# Patient Record
Sex: Male | Born: 1960 | Race: Black or African American | Hispanic: No | Marital: Single | Smoking: Current every day smoker
Health system: Southern US, Community
[De-identification: ages and names within clinical notes are randomized; demographics above are authoritative.]

## PROBLEM LIST (undated history)

## (undated) DIAGNOSIS — I639 Cerebral infarction, unspecified: Secondary | ICD-10-CM

## (undated) DIAGNOSIS — M25569 Pain in unspecified knee: Secondary | ICD-10-CM

## (undated) DIAGNOSIS — I214 Non-ST elevation (NSTEMI) myocardial infarction: Secondary | ICD-10-CM

## (undated) DIAGNOSIS — E785 Hyperlipidemia, unspecified: Secondary | ICD-10-CM

## (undated) DIAGNOSIS — I10 Essential (primary) hypertension: Secondary | ICD-10-CM

## (undated) DIAGNOSIS — M549 Dorsalgia, unspecified: Secondary | ICD-10-CM

## (undated) DIAGNOSIS — F1910 Other psychoactive substance abuse, uncomplicated: Secondary | ICD-10-CM

## (undated) DIAGNOSIS — M1990 Unspecified osteoarthritis, unspecified site: Secondary | ICD-10-CM

## (undated) DIAGNOSIS — I2510 Atherosclerotic heart disease of native coronary artery without angina pectoris: Secondary | ICD-10-CM

## (undated) DIAGNOSIS — G8929 Other chronic pain: Secondary | ICD-10-CM

## (undated) DIAGNOSIS — Z720 Tobacco use: Secondary | ICD-10-CM

## (undated) HISTORY — DX: Cerebral infarction, unspecified: I63.9

## (undated) HISTORY — DX: Essential (primary) hypertension: I10

## (undated) HISTORY — DX: Atherosclerotic heart disease of native coronary artery without angina pectoris: I25.10

## (undated) HISTORY — DX: Tobacco use: Z72.0

---

## 1998-03-22 ENCOUNTER — Emergency Department (HOSPITAL_COMMUNITY): Admission: EM | Admit: 1998-03-22 | Discharge: 1998-03-22 | Payer: Self-pay | Admitting: Emergency Medicine

## 2000-04-05 ENCOUNTER — Emergency Department (HOSPITAL_COMMUNITY): Admission: EM | Admit: 2000-04-05 | Discharge: 2000-04-05 | Payer: Self-pay | Admitting: Emergency Medicine

## 2000-04-05 ENCOUNTER — Encounter: Payer: Self-pay | Admitting: Emergency Medicine

## 2005-07-31 ENCOUNTER — Emergency Department (HOSPITAL_COMMUNITY): Admission: EM | Admit: 2005-07-31 | Discharge: 2005-07-31 | Payer: Self-pay | Admitting: Emergency Medicine

## 2005-10-04 ENCOUNTER — Emergency Department (HOSPITAL_COMMUNITY): Admission: EM | Admit: 2005-10-04 | Discharge: 2005-10-04 | Payer: Self-pay | Admitting: Emergency Medicine

## 2006-03-12 ENCOUNTER — Emergency Department (HOSPITAL_COMMUNITY): Admission: EM | Admit: 2006-03-12 | Discharge: 2006-03-12 | Payer: Self-pay | Admitting: Emergency Medicine

## 2006-11-23 ENCOUNTER — Emergency Department (HOSPITAL_COMMUNITY): Admission: EM | Admit: 2006-11-23 | Discharge: 2006-11-23 | Payer: Self-pay | Admitting: Emergency Medicine
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
## 2010-12-07 LAB — DIFFERENTIAL
Basophils Relative: 0
Eosinophils Absolute: 0.1
Monocytes Relative: 4
Neutrophils Relative %: 82 — ABNORMAL HIGH

## 2010-12-07 LAB — CBC
MCHC: 34.5
MCV: 91.6
Platelets: 258
RBC: 4.79

## 2010-12-07 LAB — I-STAT 8, (EC8 V) (CONVERTED LAB)
Acid-base deficit: 1
BUN: 9
Bicarbonate: 22.6
HCT: 50
Hemoglobin: 17
Operator id: 192351
Sodium: 138
TCO2: 24

## 2010-12-07 LAB — RAPID URINE DRUG SCREEN, HOSP PERFORMED
Opiates: NOT DETECTED
Tetrahydrocannabinol: NOT DETECTED

## 2010-12-07 LAB — POCT I-STAT CREATININE: Creatinine, Ser: 1.3

## 2015-03-21 ENCOUNTER — Encounter (HOSPITAL_COMMUNITY): Payer: Self-pay

## 2015-03-21 ENCOUNTER — Emergency Department (HOSPITAL_COMMUNITY)
Admission: EM | Admit: 2015-03-21 | Discharge: 2015-03-21 | Disposition: A | Discharge status: Home / Self Care | Payer: Self-pay | Attending: Emergency Medicine | Admitting: Emergency Medicine

## 2015-03-21 DIAGNOSIS — G8929 Other chronic pain: Secondary | ICD-10-CM

## 2015-03-21 DIAGNOSIS — F17210 Nicotine dependence, cigarettes, uncomplicated: Secondary | ICD-10-CM | POA: Insufficient documentation

## 2015-03-21 DIAGNOSIS — M25561 Pain in right knee: Secondary | ICD-10-CM | POA: Insufficient documentation

## 2015-03-21 DIAGNOSIS — H9202 Otalgia, left ear: Secondary | ICD-10-CM | POA: Insufficient documentation

## 2015-03-21 HISTORY — DX: Unspecified osteoarthritis, unspecified site: M19.90

## 2015-03-21 MED ORDER — AMOXICILLIN 500 MG PO CAPS: 500.0000 mg | ORAL_CAPSULE | Freq: Three times a day (TID) | ORAL | Status: DC

## 2015-03-21 NOTE — Discharge Instructions (Signed)
Earache An earache, also called otalgia, can be caused by many things. Pain from an earache can be sharp, dull, or burning. The pain may be temporary or constant. Earaches can be caused by problems with the ear, such as infection in either the middle ear or the ear canal, injury, impacted ear wax, middle ear pressure, or a foreign body in the ear. Ear pain can also result from problems in other areas. This is called referred pain. For example, pain can come from a sore throat, a tooth infection, or problems with the jaw or the joint between the jaw and the skull (temporomandibular joint, or TMJ). The cause of an earache is not always easy to identify. Watchful waiting may be appropriate for some earaches until a clear cause of the pain can be found. HOME CARE INSTRUCTIONS Watch your condition for any changes. The following actions may help to lessen any discomfort that you are feeling:  Take medicines only as directed by your health care provider. This includes ear drops.  Apply ice to your outer ear to help reduce pain.  Put ice in a plastic bag.  Place a towel between your skin and the bag.  Leave the ice on for 20 minutes, 2-3 times per day.  Do not put anything in your ear other than medicine that is prescribed by your health care provider.  Try resting in an upright position instead of lying down. This may help to reduce pressure in the middle ear and relieve pain.  Chew gum if it helps to relieve your ear pain.  Control any allergies that you have.  Keep all follow-up visits as directed by your health care provider. This is important. SEEK MEDICAL CARE IF:  Your pain does not improve within 2 days.  You have a fever.  You have new or worsening symptoms. SEEK IMMEDIATE MEDICAL CARE IF:  You have a severe headache.  You have a stiff neck.  You have difficulty swallowing.  You have redness or swelling behind your ear.  You have drainage from your ear.  You have hearing  loss.  You feel dizzy.   This information is not intended to replace advice given to you by your health care provider. Make sure you discuss any questions you have with your health care provider.   Document Released: 09/30/2003 Document Revised: 03/05/2014 Document Reviewed: 09/13/2013 Elsevier Interactive Patient Education 2016 [employer]. Joint Pain Joint pain, which is also called arthralgia, can be caused by many things. Joint pain often goes away when you follow your health care provider's instructions for relieving pain at home. However, joint pain can also be caused by conditions that require further treatment. Common causes of joint pain include:  Bruising in the area of the joint.  Overuse of the joint.  Wear and tear on the joints that occur with aging (osteoarthritis).  Various other forms of arthritis.  A buildup of a crystal form of uric acid in the joint (gout).  Infections of the joint (septic arthritis) or of the bone (osteomyelitis). Your health care provider may recommend medicine to help with the pain. If your joint pain continues, additional tests may be needed to diagnose your condition. HOME CARE INSTRUCTIONS Watch your condition for any changes. Follow these instructions as directed to lessen the pain that you are feeling.  Take medicines only as directed by your health care provider.  Rest the affected area for as long as your health care provider says that you should. If directed  to do so, raise the painful joint above the level of your heart while you are sitting or lying down.  Do not do things that cause or worsen pain.  If directed, apply ice to the painful area:  Put ice in a plastic bag.  Place a towel between your skin and the bag.  Leave the ice on for 20 minutes, 2-3 times per day.  Wear an elastic bandage, splint, or sling as directed by your health care provider. Loosen the elastic bandage or splint if your fingers or toes become numb and  tingle, or if they turn cold and blue.  Begin exercising or stretching the affected area as directed by your health care provider. Ask your health care provider what types of exercise are safe for you.  Keep all follow-up visits as directed by your health care provider. This is important. SEEK MEDICAL CARE IF:  Your pain increases, and medicine does not help.  Your joint pain does not improve within 3 days.  You have increased bruising or swelling.  You have a fever.  You lose 10 lb (4.5 kg) or more without trying. SEEK IMMEDIATE MEDICAL CARE IF:  You are not able to move the joint.  Your fingers or toes become numb or they turn cold and blue.   This information is not intended to replace advice given to you by your health care provider. Make sure you discuss any questions you have with your health care provider.   Document Released: 02/12/2005 Document Revised: 03/05/2014 Document Reviewed: 11/24/2013 Elsevier Interactive Patient Education [employer]

## 2015-03-21 NOTE — ED Provider Notes (Signed)
CSN: [id number]     Arrival date & time 03/21/15  4401 History   First MD Initiated Contact with Patient 03/21/15 [phone]     Chief Complaint  Patient presents with  . knee pain and earache      (Consider location/radiation/quality/duration/timing/severity/associated sxs/prior Treatment) HPI Comments: Patient presents to the emergency department with chief complaint of right knee pain and left ear ache. He states that he has been having the right knee pain for years. States that he has arthritis in his knee. States that it occasionally flares up. He states that he has been taking pain medication with no relief. States that he will are his cousins knee brace. He denies any associated fevers or chills. Denies any swelling or new injury. There are no radiating symptoms.  Patient also complains of left earache. He states that he has had the earache intermittently as long as 8 years. He denies any fevers or chills. There are no aggravating or alleviating factors. He has not tried taking anything for his symptoms.  The history is provided by the patient. No language interpreter was used.    Past Medical History  Diagnosis Date  . Arthritis    History reviewed. No pertinent past surgical history. No family history on file. Social History  Substance Use Topics  . Smoking status: Current Every Day Smoker    Types: Cigarettes  . Smokeless tobacco: None  . Alcohol Use: None    Review of Systems  Constitutional: Negative for fever and chills.  HENT: Positive for ear pain.   Respiratory: Negative for shortness of breath.   Cardiovascular: Negative for chest pain.  Gastrointestinal: Negative for nausea, vomiting, diarrhea and constipation.  Genitourinary: Negative for dysuria.  Musculoskeletal: Positive for arthralgias.      Allergies  Review of patient's allergies indicates no known allergies.  Home Medications   Prior to Admission medications   Medication Sig Start Date End Date  Taking? Authorizing Provider  amoxicillin (AMOXIL) 500 MG capsule Take 1 capsule (500 mg total) by mouth 3 (three) times daily. 03/21/15   Roxy Horseman, PA-C   BP 162/93 mmHg  Pulse 80  Temp(Src) 98 F (36.7 C) (Oral)  Resp 18  Ht  (1.626 m)  Wt 102.059 kg  BMI 38.60 kg/m2  SpO2 98% Physical Exam  Constitutional: He is oriented to person, place, and time. He appears well-developed and well-nourished.  HENT:  Head: Normocephalic and atraumatic.  Left tympanic membrane is mildly erythematous Normal right tympanic membrane  Eyes: Conjunctivae and EOM are normal. Pupils are equal, round, and reactive to light. Right eye exhibits no discharge. Left eye exhibits no discharge. No scleral icterus.  Neck: Normal range of motion. Neck supple. No JVD present.  Cardiovascular: Normal rate, regular rhythm and normal heart sounds.  Exam reveals no gallop and no friction rub.   No murmur heard. Pulmonary/Chest: Effort normal and breath sounds normal. No respiratory distress. He has no wheezes. He has no rales. He exhibits no tenderness.  Abdominal: Soft. He exhibits no distension and no mass. There is no tenderness. There is no rebound and no guarding.  Musculoskeletal: Normal range of motion. He exhibits no edema or tenderness.  Right knee range of motion strength 5/5 No bony abnormality or deformity No erythema or swelling No evidence of infection No calf tenderness  Neurological: He is alert and oriented to person, place, and time.  Skin: Skin is warm and dry.  No sign of cellulitis  Psychiatric: He has a  normal mood and affect. His behavior is normal. Judgment and thought content normal.  Nursing note and vitals reviewed.   ED Course  Procedures (including critical care time)   MDM   Final diagnoses:  Ear ache, left  Chronic knee pain, right    Patient with left earache, possible otitis, tympanic membrane is mildly erythematous. Right knee nontender to palpation, no bony  abnormality or deformity. Normal range of motion strength. No indication for imaging. Will give knee sleeve. Recommend close follow-up with orthopedics and ENT, as both these problems are acute on chronic.    Roxy Horseman, PA-C 03/21/15 0831  Blane Ohara, MD 03/21/15 [phone]

## 2015-03-21 NOTE — ED Notes (Signed)
Declined W/C at D/C and was escorted to lobby by RN. 

## 2015-03-21 NOTE — ED Notes (Addendum)
Patient here reporting that he needs something for pain for his right knee, states arthritis acting up, also wants left ear checked for chronic pain to same, ear pain x 8 years

## 2015-04-11 ENCOUNTER — Emergency Department (HOSPITAL_COMMUNITY)
Admission: EM | Admit: 2015-04-11 | Discharge: 2015-04-11 | Disposition: A | Discharge status: Home / Self Care | Payer: Self-pay | Attending: Emergency Medicine | Admitting: Emergency Medicine

## 2015-04-11 ENCOUNTER — Encounter (HOSPITAL_COMMUNITY): Payer: Self-pay | Admitting: Emergency Medicine

## 2015-04-11 DIAGNOSIS — G8929 Other chronic pain: Secondary | ICD-10-CM | POA: Insufficient documentation

## 2015-04-11 DIAGNOSIS — M549 Dorsalgia, unspecified: Secondary | ICD-10-CM | POA: Insufficient documentation

## 2015-04-11 DIAGNOSIS — F17210 Nicotine dependence, cigarettes, uncomplicated: Secondary | ICD-10-CM | POA: Insufficient documentation

## 2015-04-11 DIAGNOSIS — M25511 Pain in right shoulder: Secondary | ICD-10-CM | POA: Insufficient documentation

## 2015-04-11 DIAGNOSIS — M25561 Pain in right knee: Secondary | ICD-10-CM | POA: Insufficient documentation

## 2015-04-11 DIAGNOSIS — Z792 Long term (current) use of antibiotics: Secondary | ICD-10-CM | POA: Insufficient documentation

## 2015-04-11 DIAGNOSIS — M25512 Pain in left shoulder: Secondary | ICD-10-CM | POA: Insufficient documentation

## 2015-04-11 HISTORY — DX: Pain in unspecified knee: M25.569

## 2015-04-11 HISTORY — DX: Dorsalgia, unspecified: M54.9

## 2015-04-11 HISTORY — DX: Other chronic pain: G89.29

## 2015-04-11 MED ORDER — NAPROXEN 500 MG PO TABS: 500.0000 mg | ORAL_TABLET | Freq: Two times a day (BID) | ORAL | Status: DC

## 2015-04-11 NOTE — ED Notes (Signed)
Declined W/C at D/C and was escorted to lobby by RN. 

## 2015-04-11 NOTE — Discharge Instructions (Signed)
Take your medications as prescribed to help with your discomfort. Follow-up with your doctor for reevaluation. He may also follow-up with orthopedics if her symptoms do not improve.

## 2015-04-11 NOTE — ED Provider Notes (Signed)
CSN: [id number]     Arrival date & time 04/11/15  0865 History   By signing my name below, I, Kevin Barton, attest that this documentation has been prepared under the direction and in the presence of Kevin Hatchet, PA-C.  Electronically Signed: Iona Barton, ED Scribe 04/11/2015 at 12:15 PM.    Chief Complaint  Patient presents with  . Back Pain  . Knee Pain    The history is provided by the patient. No language interpreter was used.   HPI Comments: Kevin Barton is a 55 y.o. male who presents to the Emergency Department complaining of gradual onset, constant, chronic back pain, worsening in the past few weeks. Pt also notes associated right knee pain, and pain in both shoulders. Back pain and knee pain is worsened with movement. Pt states that he believes the pain is caused by arthritis and that he is unable to work because of its severity. Pt denies fevers, chills, nausea, numbness, or any other associated symptoms.   Past Medical History  Diagnosis Date  . Arthritis   . Chronic knee pain   . Chronic back pain    History reviewed. No pertinent past surgical history. No family history on file. Social History  Substance Use Topics  . Smoking status: Current Every Day Smoker    Types: Cigarettes  . Smokeless tobacco: None  . Alcohol Use: None    Review of Systems  Cardiovascular: Negative for chest pain.  Musculoskeletal: Positive for arthralgias.  Neurological: Negative for dizziness, weakness and numbness.  All other systems reviewed and are negative.     Allergies  Review of patient's allergies indicates no known allergies.  Home Medications   Prior to Admission medications   Medication Sig Start Date End Date Taking? Authorizing Provider  amoxicillin (AMOXIL) 500 MG capsule Take 1 capsule (500 mg total) by mouth 3 (three) times daily. 03/21/15   Kevin Horseman, PA-C  naproxen (NAPROSYN) 500 MG tablet Take 1 tablet (500 mg total) by mouth 2 (two) times  daily. 04/11/15   Kevin Peek, PA-C   BP 142/89 mmHg  Pulse 83  Temp(Src) 98.4 F (36.9 C) (Oral)  Resp 18  Ht  (1.626 m)  Wt 225 lb (102.059 kg)  BMI 38.60 kg/m2  SpO2 97% Physical Exam  Constitutional: He appears well-developed and well-nourished. No distress.  HENT:  Head: Normocephalic and atraumatic.  Eyes: Conjunctivae and EOM are normal.  Neck: Neck supple. No tracheal deviation present.  Cardiovascular: Normal rate, regular rhythm, normal heart sounds and intact distal pulses.   Pulmonary/Chest: Effort normal and breath sounds normal. No respiratory distress. He has no wheezes. He has no rales.  Abdominal: Soft. Bowel sounds are normal. He exhibits no distension. There is no tenderness. There is no rebound and no guarding.  Musculoskeletal: Normal range of motion.       Right knee: He exhibits normal range of motion, no swelling, no effusion, no ecchymosis, no deformity, no laceration, no erythema, normal alignment, no LCL laxity and normal patellar mobility. No tenderness found.       Lumbar back: He exhibits normal range of motion, no tenderness, no bony tenderness, no swelling, no edema, no deformity, no laceration, no pain, no spasm and normal pulse.  Neurological: He is alert.  Skin: Skin is warm and dry.  Psychiatric: He has a normal mood and affect. His behavior is normal.    ED Course  Procedures (including critical care time) DIAGNOSTIC STUDIES: Oxygen Saturation is 97% on RA,  normal by my interpretation.    COORDINATION OF CARE:   12:05 PM-Discussed treatment plan which includes naprosyn with pt at bedside and pt agreed to plan.   Labs Review Labs Reviewed - No data to display  Imaging Review No results found.   EKG Interpretation None     Meds given in ED:  Medications - No data to display  Discharge Medication List as of 04/11/2015 12:15 PM    START taking these medications   Details  naproxen (NAPROSYN) 500 MG tablet Take 1 tablet  (500 mg total) by mouth 2 (two) times daily., Starting 04/11/2015, Until Discontinued, Print       Filed Vitals:   04/11/15 1018 04/11/15 1020 04/11/15 1216  BP:  142/89 130/79  Pulse: 83  81  Temp: 98.4 F (36.9 C)    TempSrc: Oral    Resp: 18  16  Height:  (1.626 m)    Weight: 102.059 kg    SpO2: 97%  98%    MDM  Kevin Barton is a 55 y.o. male reports a history of arthritis and chronic back and knee pain comes in for evaluation of acute exacerbation of his chronic pain. He has not tried anything to improve the symptoms. No concerning findings on physical exam. No pathologic back pain red flags. Plan to initiate NSAID therapy, referral given to primary care so patient may follow-up. Also given referral to orthopedics. He verbalizes understanding and agrees with this plan. No evidence of other acute or emergent pathology. Final diagnoses:  Knee pain, chronic, right  Bilateral back pain, unspecified location   I personally performed the services described in this documentation, which was scribed in my presence. The recorded information has been reviewed and is accurate.   Kevin Peek, PA-C 04/11/15 1557  Kevin Barrette, MD 04/13/15 [phone]

## 2015-04-11 NOTE — ED Notes (Signed)
Pt c/o pain to knee and low back ongoing for six years.

## 2015-07-21 ENCOUNTER — Encounter (HOSPITAL_COMMUNITY): Payer: Self-pay | Admitting: Emergency Medicine

## 2015-07-21 ENCOUNTER — Emergency Department (HOSPITAL_COMMUNITY)
Admission: EM | Admit: 2015-07-21 | Discharge: 2015-07-21 | Disposition: A | Discharge status: Home / Self Care | Payer: Self-pay | Attending: Emergency Medicine | Admitting: Emergency Medicine

## 2015-07-21 DIAGNOSIS — Z792 Long term (current) use of antibiotics: Secondary | ICD-10-CM | POA: Insufficient documentation

## 2015-07-21 DIAGNOSIS — G8929 Other chronic pain: Secondary | ICD-10-CM | POA: Insufficient documentation

## 2015-07-21 DIAGNOSIS — Z791 Long term (current) use of non-steroidal anti-inflammatories (NSAID): Secondary | ICD-10-CM | POA: Insufficient documentation

## 2015-07-21 DIAGNOSIS — M1990 Unspecified osteoarthritis, unspecified site: Secondary | ICD-10-CM | POA: Insufficient documentation

## 2015-07-21 DIAGNOSIS — H9202 Otalgia, left ear: Secondary | ICD-10-CM | POA: Insufficient documentation

## 2015-07-21 DIAGNOSIS — H73892 Other specified disorders of tympanic membrane, left ear: Secondary | ICD-10-CM | POA: Insufficient documentation

## 2015-07-21 DIAGNOSIS — F17210 Nicotine dependence, cigarettes, uncomplicated: Secondary | ICD-10-CM | POA: Insufficient documentation

## 2015-07-21 MED ORDER — AMOXICILLIN-POT CLAVULANATE 875-125 MG PO TABS: 1.0000 | ORAL_TABLET | Freq: Two times a day (BID) | ORAL | Status: DC

## 2015-07-21 MED ORDER — NEOMYCIN-POLYMYXIN-HC 3.5-10000-1 OT SUSP: 4.0000 [drp] | Freq: Three times a day (TID) | OTIC | Status: DC

## 2015-07-21 NOTE — ED Provider Notes (Signed)
CSN: [id number]     Arrival date & time 07/21/15  1416 History   By signing my name below, I, Kevin Barton, attest that this documentation has been prepared under the direction and in the presence of [name], PA-C. Electronically Signed: Tanda Barton, ED Scribe. 07/21/2015. 3:10 PM.   No chief complaint on file.  The history is provided by the patient. No language interpreter was used.    HPI Comments: Kevin Barton is a 55 y.o. male who presents to the Emergency Department complaining of a intermittent, left ear pain radiating along the left side of face onset 2 days ago.The ear pain worsens with air contact. Patient has been having recurrent ear infections for the past 3 months and was previously treated with Amoxicillin and Naproxen with relief.  Patient denies drainage, difficulty hearing, nasal congestion, and fevers.    Past Medical History  Diagnosis Date  . Arthritis   . Chronic knee pain   . Chronic back pain    No past surgical history on file. No family history on file. Social History  Substance Use Topics  . Smoking status: Current Every Day Smoker    Types: Cigarettes  . Smokeless tobacco: Not on file  . Alcohol Use: Not on file    Review of Systems  Constitutional: Negative for fever.  HENT: Positive for ear pain. Negative for congestion, ear discharge and hearing loss.   All other systems reviewed and are negative.  Allergies  Review of patient's allergies indicates no known allergies.  Home Medications   Prior to Admission medications   Medication Sig Start Date End Date Taking? Authorizing Provider  amoxicillin (AMOXIL) 500 MG capsule Take 1 capsule (500 mg total) by mouth 3 (three) times daily. 03/21/15   Roxy Horseman, PA-C  naproxen (NAPROSYN) 500 MG tablet Take 1 tablet (500 mg total) by mouth 2 (two) times daily. 04/11/15   Joycie Peek, PA-C   There were no vitals taken for this visit. Physical Exam  Constitutional: He is oriented to  person, place, and time. He appears well-developed and well-nourished. No distress.  HENT:  Head: Normocephalic and atraumatic.  Right Ear: External ear normal.  Left Ear: External ear normal.  Nose: Nose normal.  Mouth/Throat: Oropharynx is clear and moist. No oropharyngeal exudate.  Mild erythema of superior half the left tympanic membrane inferior half mildly opaque, nonbulging Left external auditory canal normal appearance without edema, erythema or exudate Right TM and external auditory canal normal  Eyes: Conjunctivae and EOM are normal. Pupils are equal, round, and reactive to light. Right eye exhibits no discharge. Left eye exhibits no discharge. No scleral icterus.  Neck: Normal range of motion. Neck supple. No JVD present. No tracheal deviation present.  Cardiovascular: Normal rate and regular rhythm.   Pulmonary/Chest: Effort normal and breath sounds normal. No stridor. No respiratory distress.  Musculoskeletal: Normal range of motion. He exhibits no edema.  Lymphadenopathy:    He has no cervical adenopathy.  Neurological: He is alert and oriented to person, place, and time. He exhibits normal muscle tone. Coordination normal.  Skin: Skin is warm and dry. No rash noted. He is not diaphoretic. No erythema. No pallor.  Psychiatric: He has a normal mood and affect. His behavior is normal. Judgment and thought content normal.  Nursing note and vitals reviewed.   ED Course  Procedures (including critical care time) DIAGNOSTIC STUDIES: Oxygen Saturation is 98% on RA, normal by my interpretation.    COORDINATION OF CARE: 3:10 PM-Discussed  treatment plan which includes Rx of oral antibiotics, ear drops, and ENT referral with pt at bedside and pt agreed to plan.   MDM   Patient with left otalgia, some erythema and opacity at left TM, he's had multiple ear complaints and infections in the past 3 months which she states improved with antibiotic treatment.  We'll give drops and oral  antibiotics, ENT referral given with 3-4 visits in the past 3 months with ear complaints. Patient otherwise is well-appearing, vital signs stable, safe to discharge home.  Final diagnoses:  Otalgia, left    I personally performed the services described in this documentation, which was scribed in my presence. The recorded information has been reviewed and is accurate.        Danelle BerryLeisa Nalea Salce, PA-C 07/24/15 0009  Benjiman CoreNathan Pickering, MD 07/28/15 2257

## 2015-07-21 NOTE — Discharge Instructions (Signed)
Earache An earache, also called otalgia, can be caused by many things. Pain from an earache can be sharp, dull, or burning. The pain may be temporary or constant. Earaches can be caused by problems with the ear, such as infection in either the middle ear or the ear canal, injury, impacted ear wax, middle ear pressure, or a foreign body in the ear. Ear pain can also result from problems in other areas. This is called referred pain. For example, pain can come from a sore throat, a tooth infection, or problems with the jaw or the joint between the jaw and the skull (temporomandibular joint, or TMJ). The cause of an earache is not always easy to identify. Watchful waiting may be appropriate for some earaches until a clear cause of the pain can be found. HOME CARE INSTRUCTIONS Watch your condition for any changes. The following actions may help to lessen any discomfort that you are feeling:  Take medicines only as directed by your health care provider. This includes ear drops.  Apply ice to your outer ear to help reduce pain.  Put ice in a plastic bag.  Place a towel between your skin and the bag.  Leave the ice on for 20 minutes, 2-3 times per day.  Do not put anything in your ear other than medicine that is prescribed by your health care provider.  Try resting in an upright position instead of lying down. This may help to reduce pressure in the middle ear and relieve pain.  Chew gum if it helps to relieve your ear pain.  Control any allergies that you have.  Keep all follow-up visits as directed by your health care provider. This is important. SEEK MEDICAL CARE IF:  Your pain does not improve within 2 days.  You have a fever.  You have new or worsening symptoms. SEEK IMMEDIATE MEDICAL CARE IF:  You have a severe headache.  You have a stiff neck.  You have difficulty swallowing.  You have redness or swelling behind your ear.  You have drainage from your ear.  You have hearing  loss.  You feel dizzy.   This information is not intended to replace advice given to you by your health care provider. Make sure you discuss any questions you have with your health care provider.   Document Released: 09/30/2003 Document Revised: 03/05/2014 Document Reviewed: 09/13/2013 Elsevier Interactive Patient Education 2016 Elsevier Inc.  Ear Drops, Adult You have been diagnosed with a condition requiring you to put drops of medicine into your outer ear. HOME CARE INSTRUCTIONS   Put drops in the affected ear as instructed. After putting the drops in, you will need to lie down with the affected ear facing up for ten minutes so the drops will remain in the ear canal and run down and fill the canal. Continue using the ear drops for as long as directed by your health care provider.  Prior to getting up, put a cotton ball gently in your ear canal. Leave enough of the cotton ball out so it can be easily removed. Do not attempt to push this down into the canal with a cotton-tipped swab or other instrument.  Do not irrigate or wash out your ears if you have had a perforated eardrum or mastoid surgery, or unless instructed to do so by your health care provider.  Keep appointments with your health care provider as instructed.  Finish all medicine, or use for the length of time prescribed by your health care provider.  Continue the drops even if your problem seems to be doing well after a couple days, or continue as instructed. SEEK MEDICAL CARE IF:  You become worse or develop increasing pain.  You notice any unusual drainage from your ear (particularly if the drainage has a bad smell).  You develop hearing difficulties.  You experience a serious form of dizziness in which you feel as if the room is spinning, and you feel nauseated (vertigo).  The outside of your ear becomes red or swollen or both. This may be a sign of an allergic reaction. MAKE SURE YOU:   Understand these  instructions.  Will watch your condition.  Will get help right away if you are not doing well or get worse.   This information is not intended to replace advice given to you by your health care provider. Make sure you discuss any questions you have with your health care provider.   Document Released: 02/06/2001 Document Revised: 03/05/2014 Document Reviewed: 09/09/2012 Elsevier Interactive Patient Education [employer] Elsevier Inc.

## 2015-07-21 NOTE — ED Notes (Signed)
Patient states recurrent L ear infections for 3 x years.   Patient states recently had 3 x infections in the last few weeks.   Patient states L ear pain with no other symptoms.

## 2016-07-04 ENCOUNTER — Emergency Department (HOSPITAL_COMMUNITY)
Admission: EM | Admit: 2016-07-04 | Discharge: 2016-07-04 | Discharge status: Against Medical Advice | Payer: Self-pay | Attending: Emergency Medicine | Admitting: Emergency Medicine

## 2016-07-04 ENCOUNTER — Emergency Department (HOSPITAL_COMMUNITY): Payer: Self-pay

## 2016-07-04 ENCOUNTER — Encounter (HOSPITAL_COMMUNITY): Payer: Self-pay | Admitting: Emergency Medicine

## 2016-07-04 ENCOUNTER — Other Ambulatory Visit: Payer: Self-pay

## 2016-07-04 DIAGNOSIS — F17210 Nicotine dependence, cigarettes, uncomplicated: Secondary | ICD-10-CM | POA: Insufficient documentation

## 2016-07-04 DIAGNOSIS — I214 Non-ST elevation (NSTEMI) myocardial infarction: Secondary | ICD-10-CM | POA: Insufficient documentation

## 2016-07-04 DIAGNOSIS — R7989 Other specified abnormal findings of blood chemistry: Secondary | ICD-10-CM | POA: Insufficient documentation

## 2016-07-04 DIAGNOSIS — R072 Precordial pain: Secondary | ICD-10-CM | POA: Insufficient documentation

## 2016-07-04 DIAGNOSIS — Z79899 Other long term (current) drug therapy: Secondary | ICD-10-CM | POA: Insufficient documentation

## 2016-07-04 DIAGNOSIS — R778 Other specified abnormalities of plasma proteins: Secondary | ICD-10-CM

## 2016-07-04 LAB — I-STAT TROPONIN, ED
TROPONIN I, POC: 0.09 ng/mL — AB (ref 0.00–0.08)
Troponin i, poc: 0.04 ng/mL (ref 0.00–0.08)

## 2016-07-04 LAB — CBC
HEMATOCRIT: 38.3 % — AB (ref 39.0–52.0)
Hemoglobin: 13.2 g/dL (ref 13.0–17.0)
MCH: 30.8 pg (ref 26.0–34.0)
MCHC: 34.5 g/dL (ref 30.0–36.0)
MCV: 89.3 fL (ref 78.0–100.0)
PLATELETS: 228 10*3/uL (ref 150–400)
RBC: 4.29 MIL/uL (ref 4.22–5.81)
RDW: 13.8 % (ref 11.5–15.5)
WBC: 7.7 10*3/uL (ref 4.0–10.5)

## 2016-07-04 LAB — HEPATIC FUNCTION PANEL
ALBUMIN: 3.8 g/dL (ref 3.5–5.0)
ALK PHOS: 67 U/L (ref 38–126)
ALT: 18 U/L (ref 17–63)
AST: 25 U/L (ref 15–41)
BILIRUBIN TOTAL: 0.5 mg/dL (ref 0.3–1.2)
Bilirubin, Direct: 0.1 mg/dL (ref 0.1–0.5)
Indirect Bilirubin: 0.4 mg/dL (ref 0.3–0.9)
Total Protein: 7.4 g/dL (ref 6.5–8.1)

## 2016-07-04 LAB — BASIC METABOLIC PANEL
Anion gap: 7 (ref 5–15)
BUN: 11 mg/dL (ref 6–20)
CHLORIDE: 106 mmol/L (ref 101–111)
CO2: 24 mmol/L (ref 22–32)
CREATININE: 1.15 mg/dL (ref 0.61–1.24)
Calcium: 9.5 mg/dL (ref 8.9–10.3)
GFR calc non Af Amer: >60 mL/min (ref >60)
Glucose, Bld: 138 mg/dL — ABNORMAL HIGH (ref 65–99)
POTASSIUM: 4.3 mmol/L (ref 3.5–5.1)
Sodium: 137 mmol/L (ref 135–145)

## 2016-07-04 LAB — LIPASE, BLOOD: Lipase: 29 U/L (ref 11–51)

## 2016-07-04 LAB — TROPONIN I: TROPONIN I: 0.1 ng/mL — AB (ref <0.03)

## 2016-07-04 LAB — D-DIMER, QUANTITATIVE (NOT AT ARMC)

## 2016-07-04 MED ORDER — ASPIRIN 81 MG PO CHEW
324.0000 mg | CHEWABLE_TABLET | Freq: Once | ORAL | Status: AC
  Administered 2016-07-04: 324 mg via ORAL
  Filled 2016-07-04: qty 4

## 2016-07-04 MED ORDER — NITROGLYCERIN 0.4 MG SL SUBL: 0.4000 mg | SUBLINGUAL_TABLET | SUBLINGUAL | 3 refills | Status: DC | PRN

## 2016-07-04 MED ORDER — GI COCKTAIL ~~LOC~~
30.0000 mL | Freq: Once | ORAL | Status: AC
  Administered 2016-07-04: 30 mL via ORAL
  Filled 2016-07-04: qty 30

## 2016-07-04 MED ORDER — ATORVASTATIN CALCIUM 80 MG PO TABS: 80.0000 mg | ORAL_TABLET | Freq: Every day | ORAL | 3 refills | Status: DC

## 2016-07-04 MED ORDER — ISOSORBIDE MONONITRATE ER 30 MG PO TB24: 30.0000 mg | ORAL_TABLET | Freq: Every day | ORAL | 3 refills | Status: DC

## 2016-07-04 NOTE — ED Notes (Signed)
Cardiologist notified of troponin of 0.10.  Patient unwilling to stay.  Will follow up with outpatient. Per cardiologist, patient needs cath.  Patient states he will make an appointment for next week to see cardiologist in office and at that time, they will make appt for cath.

## 2016-07-04 NOTE — ED Notes (Signed)
ED Provider at bedside. 

## 2016-07-04 NOTE — ED Provider Notes (Signed)
[facility]-EMERGENCY DEPT Provider Note   CSN: [id number] Arrival date & time: 07/04/16  [id number]     History   Chief Complaint Chief Complaint  Patient presents with  . Chest Pain    HPI Kevin Barton is a 56 y.o. male.  The history is provided by the patient and medical records. No language interpreter was used.  Chest Pain   This is a new problem. The current episode started yesterday. The problem occurs constantly. The problem has been gradually improving. The pain is present in the substernal region. The pain is at a severity of 7/10. The pain is moderate. The quality of the pain is described as heavy and burning (tightness). The pain does not radiate. Episode Length: several mins each. Associated symptoms include nausea and shortness of breath. Pertinent negatives include no abdominal pain, no back pain, no cough, no diaphoresis, no dizziness, no exertional chest pressure, no headaches, no malaise/fatigue, no near-syncope, no numbness, no palpitations and no sputum production. He has tried nothing for the symptoms. The treatment provided no relief. Risk factors include smoking/tobacco exposure, obesity and male gender.    Past Medical History:  Diagnosis Date  . Arthritis   . Chronic back pain   . Chronic knee pain     There are no active problems to display for this patient.   History reviewed. No pertinent surgical history.     Home Medications    Prior to Admission medications   Medication Sig Start Date End Date Taking? Authorizing Provider  amoxicillin (AMOXIL) 500 MG capsule Take 1 capsule (500 mg total) by mouth 3 (three) times daily. 03/21/15   Roxy HorsemanBrowning, Robert, PA-C  amoxicillin-clavulanate (AUGMENTIN) 875-125 MG tablet Take 1 tablet by mouth every 12 (twelve) hours. 07/21/15   Danelle Berryapia, Leisa, PA-C  naproxen (NAPROSYN) 500 MG tablet Take 1 tablet (500 mg total) by mouth 2 (two) times daily. 04/11/15   Cartner, Sharlet SalinaBenjamin, PA-C  neomycin-polymyxin-hydrocortisone (CORTISPORIN)  3.5-10000-1 otic suspension Place 4 drops into the left ear 3 (three) times daily. 07/21/15   Danelle Berryapia, Leisa, PA-C    Family History History reviewed. No pertinent family history.  Social History Social History  Substance Use Topics  . Smoking status: Current Every Day Smoker    Packs/day: 0.50    Types: Cigarettes  . Smokeless tobacco: Never Used  . Alcohol use Yes     Comment: a beer a day     Allergies   Patient has no known allergies.   Review of Systems Review of Systems  Constitutional: Negative for appetite change, chills, diaphoresis and malaise/fatigue.  HENT: Negative for congestion.   Respiratory: Positive for chest tightness and shortness of breath. Negative for cough, sputum production, choking, wheezing and stridor.   Cardiovascular: Positive for chest pain. Negative for palpitations, leg swelling and near-syncope.  Gastrointestinal: Positive for nausea. Negative for abdominal pain, constipation and diarrhea.  Genitourinary: Negative for dysuria.  Musculoskeletal: Negative for back pain.  Skin: Negative for wound.  Neurological: Negative for dizziness, light-headedness, numbness and headaches.  Psychiatric/Behavioral: Negative for agitation.  All other systems reviewed and are negative.    Physical Exam Updated Vital Signs BP (!) 180/87 (BP Location: Right Arm)   Pulse 76   Temp 97.6 F (36.4 C) (Oral)   Resp 16   Ht 5\' 4"  (1.626 m)   Wt 225 lb (102.1 kg)   SpO2 94%   BMI 38.62 kg/m   Physical Exam  Constitutional: He is oriented to person, place, and time. He appears  well-developed and well-nourished.  HENT:  Head: Normocephalic and atraumatic.  Mouth/Throat: Oropharynx is clear and moist. No oropharyngeal exudate.  Eyes: Conjunctivae and EOM are normal. Pupils are equal, round, and reactive to light.  Neck: Normal range of motion. Neck supple.  Cardiovascular: Normal rate, regular rhythm, normal heart sounds and intact distal pulses.   No murmur  heard. Pulmonary/Chest: Effort normal and breath sounds normal. No respiratory distress. He has no wheezes. He exhibits no tenderness.  Abdominal: Soft. There is no tenderness.  Musculoskeletal: He exhibits no edema or tenderness.  Neurological: He is alert and oriented to person, place, and time. No sensory deficit. He exhibits normal muscle tone.  Skin: Skin is warm and dry. Capillary refill takes less than 2 seconds. No pallor.  Psychiatric: He has a normal mood and affect.  Nursing note and vitals reviewed.    ED Treatments / Results  Labs (all labs ordered are listed, but only abnormal results are displayed) Labs Reviewed  BASIC METABOLIC PANEL - Abnormal; Notable for the following:       Result Value   Glucose, Bld 138 (*)    All other components within normal limits  CBC - Abnormal; Notable for the following:    HCT 38.3 (*)    All other components within normal limits  TROPONIN I - Abnormal; Notable for the following:    Troponin I 0.10 (*)    All other components within normal limits  I-STAT TROPOININ, ED - Abnormal; Notable for the following:    Troponin i, poc 0.09 (*)    All other components within normal limits  D-DIMER, QUANTITATIVE (NOT AT [facility])  LIPASE, BLOOD  HEPATIC FUNCTION PANEL  I-STAT TROPOININ, ED    EKG  EKG Interpretation  Date/Time:  Wednesday Jul 04 2016 09:33:11 EDT Ventricular Rate:  77 PR Interval:  190 QRS Duration: 82 QT Interval:  344 QTC Calculation: 389 R Axis:   56 Text Interpretation:  Normal sinus rhythm Nonspecific ST and T wave abnormality Abnormal ECG Confirmed by Fayrene Fearing  MD, MARK ([id number]) on 07/04/2016 9:35:35 AM Also confirmed by Fayrene Fearing  MD, MARK ([id number]), editor Misty Stanley [phone])  on 07/04/2016 10:30:57 AM       Radiology Dg Chest 2 View  Result Date: 07/04/2016 CLINICAL DATA:  Shortness of breath, chest pain EXAM: CHEST  2 VIEW COMPARISON:  11/23/2006 FINDINGS: Heart and mediastinal contours are within normal limits.  No focal opacities or effusions. No acute bony abnormality. IMPRESSION: No active cardiopulmonary disease. Electronically Signed   By: Charlett Nose M.D.   On: 07/04/2016 10:08    Procedures Procedures (including critical care time)  Medications Ordered in ED Medications  gi cocktail (Maalox,Lidocaine,Donnatal) (30 mLs Oral Given 07/04/16 1043)  aspirin chewable tablet 324 mg (324 mg Oral Given 07/04/16 1043)     Initial Impression / Assessment and Plan / ED Course  I have reviewed the triage vital signs and the nursing notes.  Pertinent labs & imaging results that were available during my care of the patient were reviewed by me and considered in my medical decision making (see chart for details).      Kevin Barton is a 56 y.o. male With a past medical history significant for arthritis who presents with chest pain. Patient reports that he used a new taco seasoning last night for dinner and shortly thereafter, began having chest discomfort. He describes his pain as burning, tightness, and pressure in his central chest. He reports that the episodes last for  several minutes and improved. He reported associated shortness of breath and nausea with it. He denies exertional symptoms or pleuritic symptoms. He is a smoker. He describes that last night he had pain all night on and off including today. He says it is worsen when he changes positions. He denies trauma. He denies allergies to medications. He denies new rashes, lightheadedness, or stridor.  Patient describes his pain as a seven out of 10 in severity when he is feeling it. Patient is currently chest pain free but continues to have episodes. He denies any leg pain or like swelling. He denies any fevers, chills, cough. He denies other symptoms.  Initial EKG showed some nonspecific T-wave abnormalities. No STEMI. Chest x-ray unremarkable. Initial troponin was negative however given patient's age, Delta troponin ordered. Other initial laboratory  testing showed negative D dimer, unremarkable hepatic function panel, lipase, and CBC.  Patient given aspirin during workup and a G.I. cocktail. Patient had no more chest pain and had continue resolution of symptoms.  Delta troponin return positive with a point-of-care troponin at 0.09. Cardiology was called for recommendations. They requested patient get a troponin I sent to the lab. This was performed and continue to elevate at 0.1.  Cardiology came to see the patient and recommended admission and likely catheterization as they are concern for ACS.  Patient refuses admission because he needs to go home. Patient understands that he may die if he leaves the emergency department without further treatment of possible cardiac chest pain. Cardiology will have the patient follow-up in clinic and explain to him extremely strict return precautions for any new or worsened symptoms. Cardiology team called in prescriptions for patient.  Patient left against medical advice with his chest pain resolved but is troponin rising. Patient understood that he is welcome back to the emergency department any time for further workup, management, and likely admission to cardiology.    Final Clinical Impressions(s) / ED Diagnoses   Final diagnoses:  Precordial chest pain  Elevated troponin    New Prescriptions Discharge Medication List as of 07/04/2016  4:36 PM      Clinical Impression: 1. Precordial chest pain   2. Elevated troponin     Disposition: AMA  Condition: Stable   Discharge Medication List as of 07/04/2016  4:36 PM      Follow Up: Manson Passey, PA [address] [place] [address] [phone]  Follow up on 07/12/2016 at 1:30pm for your follow up appt.      Tegeler, Canary Brim, MD 07/04/16 [phone]

## 2016-07-04 NOTE — Consult Note (Addendum)
Patient ID: Dolly RiasDarryl Turano MRN: [id number] DOB/AGE: 1961-01-22 56 y.o.  Admit date: 07/04/2016 Referring Physician: EDP Primary Physician: No PCP Primary Cardiologist: No Cardiogist Reason for Consultation: Chest Pain  HPI:  This is a 56 y.o. AA man with PMHx of osteoarthritis, HTN, tobacco use, daily EtOH (drinks 40oz beer daily) use who presents today with chest pain and found to have an elevated POCT troponin of 0.09.  Patient states onset of symptoms last evening after eating a new taco mix.  Symptoms developed while at rest.  He describes as a centrally located, sharp yet squeezing pain.  He denies any radiating symptoms.  The pain was off and on throughout the night and would last for approximately 2 minutes.  His pain persisted this morning so he presented to the ED for evaluation.  Initial labs fairly unremarkable except for POCT troponin of 0.04 then 0.09.  EKG without acute ischemic changes but did indicate non-specific ST-changes.  His blood pressure initially elevated to 180/87 but has come down to more reasonable readings this afternoon of 143/82.  He reports pain is improved with aspirin and GI cocktail.  Prior to last evening, patient also reports episode of chest tightness relieved with rest and deep breath while walking to the grocery store.  He was initially evaluated for this about 1 year ago and given an inhaler due to concern it was related to his smoking history.  His chest tightness and DOE has been relatively stable for the past year until yesterday afternoon.  Currently, he is chest pain free.  He denies history of GERD.  No history of ischemic evaluation.  Denies history of elicit drug use.  Denies history of DM or family hx of heart disease.  On ROS, pt denies fever, chills, nausea, vomiting, orthopnea, PND, LE swelling.  Past Medical History:  Diagnosis Date  . Arthritis   . Chronic back pain   . Chronic knee pain     History reviewed. No pertinent family  history.  Social History   Social History  . Marital status: Single    Spouse name: N/A  . Number of children: N/A  . Years of education: N/A   Occupational History  . Not on file.   Social History Main Topics  . Smoking status: Current Every Day Smoker    Packs/day: 0.50    Types: Cigarettes  . Smokeless tobacco: Never Used  . Alcohol use Yes     Comment: a beer a day  . Drug use: No  . Sexual activity: Not on file   Other Topics Concern  . Not on file   Social History Narrative  . No narrative on file    History reviewed. No pertinent surgical history.  No Known Allergies  Prior to Admission medications   Medication Sig Start Date End Date Taking? Authorizing Provider  ibuprofen (ADVIL,MOTRIN) 800 MG tablet Take 800 mg by mouth every 8 (eight) hours as needed for moderate pain.   Yes [provider]  lisinopril-hydrochlorothiazide (PRINZIDE,ZESTORETIC) 20-25 MG tablet Take 1 tablet by mouth daily.   Yes [provider]  oxyCODONE-acetaminophen (PERCOCET/ROXICET) 5-325 MG tablet Take 1 tablet by mouth 2 (two) times daily.   Yes [provider]  amoxicillin (AMOXIL) 500 MG capsule Take 1 capsule (500 mg total) by mouth 3 (three) times daily. Patient not taking: Reported on 07/04/2016 03/21/15   Roxy HorsemanBrowning, Robert, PA-C  amoxicillin-clavulanate (AUGMENTIN) 875-125 MG tablet Take 1 tablet by mouth every 12 (twelve) hours.  Patient not taking: Reported on 07/04/2016 07/21/15   [name], PA-C  naproxen (NAPROSYN) 500 MG tablet Take 1 tablet (500 mg total) by mouth 2 (two) times daily. Patient not taking: Reported on 07/04/2016 04/11/15   Joycie Peek, PA-C  neomycin-polymyxin-hydrocortisone (CORTISPORIN) 3.5-10000-1 otic suspension Place 4 drops into the left ear 3 (three) times daily. Patient not taking: Reported on 07/04/2016 07/21/15   [name], PA-C    Review of systems complete and found to be negative unless listed above    Physical  Exam: Blood pressure (!) 143/82, pulse (!) 50, temperature 98 F (36.7 C), temperature source Oral, resp. rate 15, height 5\' 4"  (1.626 m), weight 225 lb (102.1 kg), SpO2 97 %.   General: alert, oriented, AA man, sitting up in no acute distress  HEENT: OP clear, mucus membranes moist  SKIN: warm, dry. No rashes.  Neuro: No focal deficits  Musculoskeletal: Muscle strength 5/5 all ext  Psychiatric: Mood and affect normal  Neck: No JVD, no carotid bruits. Lungs: normal effort, on room air, mild expiratory wheeze Cardiovascular: slightly bradycardic, regular rhythm. No murmurs, gallops or rubs.  Abdomen:Soft. Bowel sounds present. Non-tender.  Extremities: No lower extremity edema.  Warm and well-perfused   Labs:   Lab Results  Component Value Date   WBC 7.7 07/04/2016   HGB 13.2 07/04/2016   HCT 38.3 (L) 07/04/2016   MCV 89.3 07/04/2016   PLT 228 07/04/2016    Recent Labs Lab 07/04/16 0937 07/04/16 1040  NA 137  --   K 4.3  --   CL 106  --   CO2 24  --   BUN 11  --   CREATININE 1.15  --   CALCIUM 9.5  --   PROT  --  7.4  BILITOT  --  0.5  ALKPHOS  --  67  ALT  --  18  AST  --  25  GLUCOSE 138*  --    D-dimer: < 0.27  POCT Troponin: 0.04 >> 0.09 Troponin I: 0.10   Radiology: CXR: No active cardiopulmonary disease.  EKG: NSR, 77 bpm, non-specific ST and T wave changes  ASSESSMENT AND PLAN:   #Chest pain He has risk factors for cardiovascular disease with his HTN, tobacco, and alcohol use.  His symptoms over the past year sound like stable angina.  His develop of symptoms yesterday afternoon and last night with troponinemia is concerning for ACS.  His mild troponin elevation may be due to demand from elevated blood pressure, but given his other symptoms, I think further evaluation is indicated.  Has received ASA 325mg  today - have recommended admission to the hospital for cardiac cath, however, patient declines this evaluation - will prescribe atorvastatin 80mg ,  ASA, and Imdur - patient will follow up early next week for further evaluation  #HTN - home meds are lisinopril/HCTZ  #Tobacco use - counseling on cessation  #Alchohol use - counseling on cessation  Signed: Gwynn Burly 07/04/2016, 3:29 PM PGY-2, [facility]  Patient seen, examined. Available data reviewed. Agree with findings, assessment, and plan as outlined by Dr Earlene Plater. The patient is independently interviewed and examined. On exam, he is alert and oriented in no distress. JVP is normal. Carotid upstrokes are normal without bruits. Heart is regular rate and rhythm with no murmur or gallop. Lung fields are clear. Abdomen soft and nontender. Extremities show no edema.  The patient's cardiac risk factors include hypertension and tobacco abuse. His history has some typical and atypical  features. However, he has had symptoms fairly typical for exertional angina. He now has some resting symptoms that occurred after eating taco mix, but they are reminiscent of his exertional chest pain. His troponin is mildly elevated. I have recommended hospital admission for treatment of acute coronary syndrome with cardiac catheterization and possible PCI. The patient is adamant about leaving the emergency room. In fact, he is not even willing to stay to allow me to schedule an outpatient heart catheterization. He is willing to come to the office for follow-up next week and we will arrange an appointment. I have called in a prescription for atorvastatin 80 mg and isosorbide 30 mg. a beta blocker is contraindicated because of resting bradycardia. Have recommended that he take an aspirin 325 mg daily. He understands that he needs to call 911 if resting chest pain occurs again. He is counseled regarding tobacco cessation. When he returns for outpatient office evaluation, would recommend setting him up for outpatient cardiac catheterization and possible PCI even if he is asymptomatic because of his  elevated enzymes making him high risk.   Tonny Bollman, M.D. 07/04/2016 5:42 PM

## 2016-07-04 NOTE — ED Triage Notes (Signed)
Pt states he developed cp last night after trying a new taco seasoning. Pain is in left chest.

## 2016-07-04 NOTE — ED Notes (Signed)
Pt advised that if he begins to have chest pain again to call 911 and to come straight to hospital.

## 2016-07-04 NOTE — Discharge Instructions (Signed)
Please call and schedule an appointment with Dr. Excell Seltzerooper the cardiology physician who saw you earlier. The cardiology team recommended admission for heart catheterization and further management however, he did not wish to do so today. If you begin having any worsen symptoms or pain, please return to the nearest emergency department. The cardiology team reports that they will call in nitroglycerin and a beta blocker medication for you. Please return if any symptoms worsen.

## 2016-07-12 ENCOUNTER — Ambulatory Visit: Payer: Self-pay | Admitting: Physician Assistant

## 2016-07-18 NOTE — Progress Notes (Signed)
Cardiology Office Note    Date:  07/24/2016   ID:  Kevin Barton, DOB 16-Jun-1960, MRN [id number]  PCP:  Patient, No Pcp Per  Cardiologist:  Dr. Excell Seltzer  Chief Complaint: ER follow up for chest pain   History of Present Illness:   Kevin Barton is a 56 y.o. male with PMHx of osteoarthritis, HTN, tobacco use, daily EtOH (drinks 40oz beer daily) presents for ER follow up.   Seen in ER 07/04/16 for centrally located, sharp yet squeezing pain after eating new taco mix. Initial labs fairly unremarkable except for POCT troponin of 0.04 then 0.09.  EKG without acute ischemic changes but did indicate non-specific ST-changes.  His blood pressure initially elevated to 180/87. He reports pain is improved with aspirin and GI cocktail. Dr. Excell Seltzer recommended admission with cath given risk factors and abnormal troponin. However he declined. He also declined outpatient cath. Started on ASA 325mg  daily, atorvastatin 80 mg and isosorbide 30 mg. a beta blocker is contraindicated because of resting bradycardia. Outpatient follow up scheduled with plan for outpatient cardiac catheterization and possible PCI even if he is asymptomatic because of his elevated enzymes making him high risk.   Here today for follow up. He continues to have substernal chest tightness and shortness of breath with exertion. Relieves with rest in 15-20 minutes. Recently noted symptoms with rest as well. His symptoms has been worsened since ER visit. He hasn't started taking Lipitor, Imdur, nitroglycerin or aspirin due to financial region. He hasn't applied for financial assistant program yet. He has application with him today and advised to fill out and apply as soon as possible. He might be staking ASA 81mg  qd. He smokes 1/2 pack a day. He denies orthopnea, PND, lower extremity edema, palpitation or syncope.   Past Medical History:  Diagnosis Date  . Arthritis   . Chronic back pain   . Chronic knee pain     No past surgical history on  file.  Current Medications: Prior to Admission medications   Medication Sig Start Date End Date Taking? Authorizing Provider  amoxicillin (AMOXIL) 500 MG capsule Take 1 capsule (500 mg total) by mouth 3 (three) times daily. Patient not taking: Reported on 07/04/2016 03/21/15   Roxy Horseman, PA-C  amoxicillin-clavulanate (AUGMENTIN) 875-125 MG tablet Take 1 tablet by mouth every 12 (twelve) hours. Patient not taking: Reported on 07/04/2016 07/21/15   [name], PA-C  atorvastatin (LIPITOR) 80 MG tablet Take 1 tablet (80 mg total) by mouth daily. 07/04/16   Arty Baumgartner, NP  ibuprofen (ADVIL,MOTRIN) 800 MG tablet Take 800 mg by mouth every 8 (eight) hours as needed for moderate pain.    [provider]  isosorbide mononitrate (IMDUR) 30 MG 24 hr tablet Take 1 tablet (30 mg total) by mouth daily. 07/04/16   Arty Baumgartner, NP  lisinopril-hydrochlorothiazide (PRINZIDE,ZESTORETIC) 20-25 MG tablet Take 1 tablet by mouth daily.    [provider]  naproxen (NAPROSYN) 500 MG tablet Take 1 tablet (500 mg total) by mouth 2 (two) times daily. Patient not taking: Reported on 07/04/2016 04/11/15   Joycie Peek, PA-C  neomycin-polymyxin-hydrocortisone (CORTISPORIN) 3.5-10000-1 otic suspension Place 4 drops into the left ear 3 (three) times daily. Patient not taking: Reported on 07/04/2016 07/21/15   [name], PA-C  nitroGLYCERIN (NITROSTAT) 0.4 MG SL tablet Place 1 tablet (0.4 mg total) under the tongue every 5 (five) minutes as needed. 07/04/16   Arty Baumgartner, NP  oxyCODONE-acetaminophen (PERCOCET/ROXICET) 5-325 MG tablet Take 1 tablet  by mouth 2 (two) times daily.    [provider]    Allergies:   Patient has no known allergies.   Social History   Social History  . Marital status: Single    Spouse name: N/A  . Number of children: N/A  . Years of education: N/A   Social History Main Topics  . Smoking status: Current Every Day Smoker    Packs/day: 0.50     Types: Cigarettes  . Smokeless tobacco: Never Used  . Alcohol use Yes     Comment: a beer a day  . Drug use: No  . Sexual activity: Not Asked   Other Topics Concern  . None   Social History Narrative  . None     Family History:  The patient's family history is not on file.   ROS:   Please see the history of present illness.    ROS All other systems reviewed and are negative.   PHYSICAL EXAM:   VS:  BP 118/70   Pulse 80   Ht 5\' 4"  (1.626 m)   Wt 207 lb 12.8 oz (94.3 kg)   BMI 35.67 kg/m    GEN: Well nourished, well developed, in no acute distress  HEENT: normal  Neck: no JVD, carotid bruits, or masses Cardiac:RRR; no murmurs, rubs, or gallops,no edema  Respiratory:  clear to auscultation bilaterally, normal work of breathing GI: soft, nontender, nondistended, + BS MS: no deformity or atrophy  Skin: warm and dry, no rash Neuro:  Alert and Oriented x 3, Strength and sensation are intact Psych: euthymic mood, full affect  Wt Readings from Last 3 Encounters:  07/24/16 207 lb 12.8 oz (94.3 kg)  07/04/16 225 lb (102.1 kg)  07/21/15 217 lb (98.4 kg)      Studies/Labs Reviewed:   EKG:  EKG is not ordered today.    Recent Labs: 07/04/2016: ALT 18; BUN 11; Creatinine, Ser 1.15; Hemoglobin 13.2; Platelets 228; Potassium 4.3; Sodium 137   Lipid Panel No results found for: CHOL, TRIG, HDL, CHOLHDL, VLDL, LDLCALC, LDLDIRECT  Additional studies/ records that were reviewed today include:   As above   ASSESSMENT & PLAN:    1. Unstable angina - Declined admission and inpatient cath during ER visit. His symptoms has been worsened. Now present at rest as well. He hasn't started any medication as recommended. He has declined direct admission and evaluation in ER today. After long discussion he is angry with outpatient cardiac catheterization tomorrow with Dr. Excell Seltzerooper. He will start taking aspirin 324 mg daily. He will need social worker and case manager consult in hospital.  Advised importance of medical compliance if his stent placement required.  The patient understands that risks include but are not limited to stroke (1 in 1000), death (1 in 1000), kidney failure [usually temporary] (1 in 500), bleeding (1 in 200), allergic reaction [possibly serious] (1 in 200), and agrees to proceed.   2. Tobacco smoking - Advised cessation. Location given. Low insight.  3. Hypertension - Currently well-controlled on lisinopril-hydrochlorothiazide 20/25 mg daily.   Medication Adjustments/Labs and Tests Ordered: Current medicines are reviewed at length with the patient today.  Concerns regarding medicines are outlined above.  Medication changes, Labs and Tests ordered today are listed in the Patient Instructions below. Patient Instructions  Medication Instructions:    TAKE  ASPIRIN 325 MG  TODAY AND TOMORROW  ( CAN BUY OVER THE COUNTER)  If you need a refill on your cardiac medications before your next  appointment, please call your pharmacy.  Labwork: CBC BMET PTINR  TODAY    Testing/Procedures: SEE LETTER FOR LEFT HEART CATH ON 07-25-16   Follow-Up: BASED UPON CAT[facility] RESULTS   Any Other Special Instructions Will Be Listed Below (If Applicable).                                                                                                                                                  Lorelei Pont-    Signed, Rashawn Rolon, PA  07/24/2016 9:54 AM    [facility] [address], [place], [place]  [address] Phone: [phone]; Fax: [phone]

## 2016-07-24 ENCOUNTER — Encounter: Payer: Self-pay | Admitting: Physician Assistant

## 2016-07-24 ENCOUNTER — Encounter: Payer: Self-pay | Admitting: *Deleted

## 2016-07-24 ENCOUNTER — Ambulatory Visit (INDEPENDENT_AMBULATORY_CARE_PROVIDER_SITE_OTHER): Payer: Self-pay | Admitting: Physician Assistant

## 2016-07-24 VITALS — BP 118/70 | HR 80 | Ht 64.0 in | Wt 207.8 lb

## 2016-07-24 DIAGNOSIS — I200 Unstable angina: Secondary | ICD-10-CM

## 2016-07-24 DIAGNOSIS — Z720 Tobacco use: Secondary | ICD-10-CM

## 2016-07-24 DIAGNOSIS — I10 Essential (primary) hypertension: Secondary | ICD-10-CM

## 2016-07-24 NOTE — Patient Instructions (Addendum)
Medication Instructions:    TAKE  ASPIRIN 325 MG  TODAY AND TOMORROW  ( CAN BUY OVER THE COUNTER)  If you need a refill on your cardiac medications before your next appointment, please call your pharmacy.  Labwork: CBC BMET PTINR  TODAY    Testing/Procedures: SEE LETTER FOR LEFT HEART CATH ON 07-25-16   Follow-Up: BASED UPON CATHS RESULTS   Any Other Special Instructions Will Be Listed Below (If Applicable).                                                                                                                                                  -

## 2016-07-25 ENCOUNTER — Encounter (HOSPITAL_COMMUNITY)
Admission: RE | Disposition: A | Discharge status: Home / Self Care | Payer: Self-pay | Source: Ambulatory Visit | Attending: Cardiovascular Disease

## 2016-07-25 ENCOUNTER — Ambulatory Visit (HOSPITAL_COMMUNITY)
Admission: RE | Admit: 2016-07-25 | Discharge: 2016-07-25 | Disposition: A | Discharge status: Home / Self Care | Payer: Self-pay | Source: Ambulatory Visit | Attending: Cardiovascular Disease | Admitting: Cardiovascular Disease

## 2016-07-25 DIAGNOSIS — I25110 Atherosclerotic heart disease of native coronary artery with unstable angina pectoris: Secondary | ICD-10-CM | POA: Insufficient documentation

## 2016-07-25 DIAGNOSIS — I10 Essential (primary) hypertension: Secondary | ICD-10-CM | POA: Insufficient documentation

## 2016-07-25 DIAGNOSIS — M25569 Pain in unspecified knee: Secondary | ICD-10-CM | POA: Insufficient documentation

## 2016-07-25 DIAGNOSIS — I2582 Chronic total occlusion of coronary artery: Secondary | ICD-10-CM | POA: Insufficient documentation

## 2016-07-25 DIAGNOSIS — G8929 Other chronic pain: Secondary | ICD-10-CM | POA: Insufficient documentation

## 2016-07-25 DIAGNOSIS — M549 Dorsalgia, unspecified: Secondary | ICD-10-CM | POA: Insufficient documentation

## 2016-07-25 DIAGNOSIS — I2510 Atherosclerotic heart disease of native coronary artery without angina pectoris: Secondary | ICD-10-CM

## 2016-07-25 DIAGNOSIS — F17210 Nicotine dependence, cigarettes, uncomplicated: Secondary | ICD-10-CM | POA: Insufficient documentation

## 2016-07-25 DIAGNOSIS — M1990 Unspecified osteoarthritis, unspecified site: Secondary | ICD-10-CM | POA: Insufficient documentation

## 2016-07-25 DIAGNOSIS — I214 Non-ST elevation (NSTEMI) myocardial infarction: Secondary | ICD-10-CM | POA: Diagnosis present

## 2016-07-25 HISTORY — PX: LEFT HEART CATH AND CORONARY ANGIOGRAPHY: CATH118249

## 2016-07-25 LAB — BASIC METABOLIC PANEL WITH GFR
Anion gap: 9 (ref 5–15)
BUN: 19 mg/dL (ref 6–20)
CO2: 21 mmol/L — ABNORMAL LOW (ref 22–32)
Calcium: 9.4 mg/dL (ref 8.9–10.3)
Chloride: 105 mmol/L (ref 101–111)
Creatinine, Ser: 1.24 mg/dL (ref 0.61–1.24)
GFR calc Af Amer: >60 mL/min
GFR calc non Af Amer: >60 mL/min
Glucose, Bld: 129 mg/dL — ABNORMAL HIGH (ref 65–99)
Potassium: 3.9 mmol/L (ref 3.5–5.1)
Sodium: 135 mmol/L (ref 135–145)

## 2016-07-25 LAB — CBC
HEMATOCRIT: 40.2 % (ref 39.0–52.0)
HEMOGLOBIN: 14 g/dL (ref 13.0–17.0)
MCH: 31.3 pg (ref 26.0–34.0)
MCHC: 34.8 g/dL (ref 30.0–36.0)
MCV: 89.7 fL (ref 78.0–100.0)
Platelets: 251 10*3/uL (ref 150–400)
RBC: 4.48 MIL/uL (ref 4.22–5.81)
RDW: 14.1 % (ref 11.5–15.5)
WBC: 7.5 10*3/uL (ref 4.0–10.5)

## 2016-07-25 LAB — PROTIME-INR
INR: 1.01
Prothrombin Time: 13.4 s (ref 11.4–15.2)

## 2016-07-25 SURGERY — LEFT HEART CATH AND CORONARY ANGIOGRAPHY
Anesthesia: LOCAL

## 2016-07-25 MED ORDER — IOPAMIDOL (ISOVUE-370) INJECTION 76%
INTRAVENOUS | Status: DC | PRN
  Administered 2016-07-25: 110 mL via INTRA_ARTERIAL

## 2016-07-25 MED ORDER — SODIUM CHLORIDE 0.9% FLUSH: 3.0000 mL | INTRAVENOUS | Status: DC | PRN

## 2016-07-25 MED ORDER — SODIUM CHLORIDE 0.9 % WEIGHT BASED INFUSION
1.0000 mL/kg/h | INTRAVENOUS | Status: DC
  Administered 2016-07-25: 1 mL/kg/h via INTRAVENOUS

## 2016-07-25 MED ORDER — HEPARIN (PORCINE) IN NACL 2-0.9 UNIT/ML-% IJ SOLN
INTRAMUSCULAR | Status: AC
  Filled 2016-07-25: qty 1000

## 2016-07-25 MED ORDER — FENTANYL CITRATE (PF) 100 MCG/2ML IJ SOLN
INTRAMUSCULAR | Status: AC
  Filled 2016-07-25: qty 2

## 2016-07-25 MED ORDER — LIDOCAINE HCL (PF) 1 % IJ SOLN
INTRAMUSCULAR | Status: DC | PRN
  Administered 2016-07-25: 2 mL

## 2016-07-25 MED ORDER — LABETALOL HCL 5 MG/ML IV SOLN
INTRAVENOUS | Status: DC | PRN
  Administered 2016-07-25: 20 mg via INTRAVENOUS

## 2016-07-25 MED ORDER — ACETAMINOPHEN 325 MG PO TABS: 650.0000 mg | ORAL_TABLET | ORAL | Status: DC | PRN

## 2016-07-25 MED ORDER — LABETALOL HCL 5 MG/ML IV SOLN
INTRAVENOUS | Status: AC
  Filled 2016-07-25: qty 4

## 2016-07-25 MED ORDER — SODIUM CHLORIDE 0.9 % IV SOLN: 250.0000 mL | INTRAVENOUS | Status: DC | PRN

## 2016-07-25 MED ORDER — IOPAMIDOL (ISOVUE-370) INJECTION 76%
INTRAVENOUS | Status: AC
  Filled 2016-07-25: qty 100

## 2016-07-25 MED ORDER — HEPARIN SODIUM (PORCINE) 1000 UNIT/ML IJ SOLN
INTRAMUSCULAR | Status: DC | PRN
  Administered 2016-07-25: 5000 [IU] via INTRAVENOUS

## 2016-07-25 MED ORDER — VERAPAMIL HCL 2.5 MG/ML IV SOLN
INTRAVENOUS | Status: AC
  Filled 2016-07-25: qty 2

## 2016-07-25 MED ORDER — LIDOCAINE HCL (PF) 1 % IJ SOLN
INTRAMUSCULAR | Status: AC
  Filled 2016-07-25: qty 30

## 2016-07-25 MED ORDER — MIDAZOLAM HCL 2 MG/2ML IJ SOLN
INTRAMUSCULAR | Status: DC | PRN
Start: 2016-07-25 — End: 2016-07-25
  Administered 2016-07-25: 2 mg via INTRAVENOUS

## 2016-07-25 MED ORDER — SODIUM CHLORIDE 0.9 % WEIGHT BASED INFUSION
3.0000 mL/kg/h | INTRAVENOUS | Status: DC
  Administered 2016-07-25: 3 mL/kg/h via INTRAVENOUS

## 2016-07-25 MED ORDER — SODIUM CHLORIDE 0.9 % IV SOLN: INTRAVENOUS | Status: DC

## 2016-07-25 MED ORDER — MIDAZOLAM HCL 2 MG/2ML IJ SOLN
INTRAMUSCULAR | Status: AC
  Filled 2016-07-25: qty 2

## 2016-07-25 MED ORDER — ASPIRIN 81 MG PO CHEW: 81.0000 mg | CHEWABLE_TABLET | ORAL | Status: DC

## 2016-07-25 MED ORDER — SODIUM CHLORIDE 0.9% FLUSH: 3.0000 mL | Freq: Two times a day (BID) | INTRAVENOUS | Status: DC

## 2016-07-25 MED ORDER — HEPARIN SODIUM (PORCINE) 1000 UNIT/ML IJ SOLN
INTRAMUSCULAR | Status: AC
  Filled 2016-07-25: qty 1

## 2016-07-25 MED ORDER — ONDANSETRON HCL 4 MG/2ML IJ SOLN: 4.0000 mg | Freq: Four times a day (QID) | INTRAMUSCULAR | Status: DC | PRN

## 2016-07-25 MED ORDER — HEPARIN (PORCINE) IN NACL 2-0.9 UNIT/ML-% IJ SOLN
INTRAMUSCULAR | Status: AC | PRN
  Administered 2016-07-25: 1000 mL

## 2016-07-25 MED ORDER — VERAPAMIL HCL 2.5 MG/ML IV SOLN
INTRAVENOUS | Status: DC | PRN
  Administered 2016-07-25: 10 mL via INTRA_ARTERIAL

## 2016-07-25 MED ORDER — FENTANYL CITRATE (PF) 100 MCG/2ML IJ SOLN
INTRAMUSCULAR | Status: DC | PRN
  Administered 2016-07-25: 25 ug via INTRAVENOUS

## 2016-07-25 SURGICAL SUPPLY — 13 items
CATH EXPO 5FR ANG PIGTAIL 145 (CATHETERS) ×1 IMPLANT
CATH INFINITI 5 FR JL3.5 (CATHETERS) ×1 IMPLANT
CATH INFINITI 5FR JL4 (CATHETERS) ×1 IMPLANT
CATH INFINITI JR4 5F (CATHETERS) ×1 IMPLANT
DEVICE RAD COMP TR BAND LRG (VASCULAR PRODUCTS) ×1 IMPLANT
GLIDESHEATH SLEND SS 6F .021 (SHEATH) ×2 IMPLANT
GUIDEWIRE INQWIRE 1.5J.035X260 (WIRE) ×1 IMPLANT
INQWIRE 1.5J .035X260CM (WIRE) ×2
KIT HEART LEFT (KITS) ×2 IMPLANT
PACK CARDIAC CATHETERIZATION (CUSTOM PROCEDURE TRAY) ×2 IMPLANT
SYR MEDRAD MARK V 150ML (SYRINGE) ×2 IMPLANT
TRANSDUCER W/STOPCOCK (MISCELLANEOUS) ×2 IMPLANT
TUBING CIL FLEX 10 FLL-RA (TUBING) ×2 IMPLANT

## 2016-07-25 NOTE — H&P (View-Only) (Signed)
Cardiology Office Note    Date:  07/24/2016   ID:  Kevin Barton, DOB 16-Jun-1960, MRN [id number]  PCP:  Patient, No Pcp Per  Cardiologist:  Dr. Excell Seltzer  Chief Complaint: ER follow up for chest pain   History of Present Illness:   Kevin Barton is a 56 y.o. male with PMHx of osteoarthritis, HTN, tobacco use, daily EtOH (drinks 40oz beer daily) presents for ER follow up.   Seen in ER 07/04/16 for centrally located, sharp yet squeezing pain after eating new taco mix. Initial labs fairly unremarkable except for POCT troponin of 0.04 then 0.09.  EKG without acute ischemic changes but did indicate non-specific ST-changes.  His blood pressure initially elevated to 180/87. He reports pain is improved with aspirin and GI cocktail. Dr. Excell Seltzer recommended admission with cath given risk factors and abnormal troponin. However he declined. He also declined outpatient cath. Started on ASA 325mg  daily, atorvastatin 80 mg and isosorbide 30 mg. a beta blocker is contraindicated because of resting bradycardia. Outpatient follow up scheduled with plan for outpatient cardiac catheterization and possible PCI even if he is asymptomatic because of his elevated enzymes making him high risk.   Here today for follow up. He continues to have substernal chest tightness and shortness of breath with exertion. Relieves with rest in 15-20 minutes. Recently noted symptoms with rest as well. His symptoms has been worsened since ER visit. He hasn't started taking Lipitor, Imdur, nitroglycerin or aspirin due to financial region. He hasn't applied for financial assistant program yet. He has application with him today and advised to fill out and apply as soon as possible. He might be staking ASA 81mg  qd. He smokes 1/2 pack a day. He denies orthopnea, PND, lower extremity edema, palpitation or syncope.   Past Medical History:  Diagnosis Date  . Arthritis   . Chronic back pain   . Chronic knee pain     No past surgical history on  file.  Current Medications: Prior to Admission medications   Medication Sig Start Date End Date Taking? Authorizing Provider  amoxicillin (AMOXIL) 500 MG capsule Take 1 capsule (500 mg total) by mouth 3 (three) times daily. Patient not taking: Reported on 07/04/2016 03/21/15   Roxy Horseman, PA-C  amoxicillin-clavulanate (AUGMENTIN) 875-125 MG tablet Take 1 tablet by mouth every 12 (twelve) hours. Patient not taking: Reported on 07/04/2016 07/21/15   [name], PA-C  atorvastatin (LIPITOR) 80 MG tablet Take 1 tablet (80 mg total) by mouth daily. 07/04/16   Arty Baumgartner, NP  ibuprofen (ADVIL,MOTRIN) 800 MG tablet Take 800 mg by mouth every 8 (eight) hours as needed for moderate pain.    [provider]  isosorbide mononitrate (IMDUR) 30 MG 24 hr tablet Take 1 tablet (30 mg total) by mouth daily. 07/04/16   Arty Baumgartner, NP  lisinopril-hydrochlorothiazide (PRINZIDE,ZESTORETIC) 20-25 MG tablet Take 1 tablet by mouth daily.    [provider]  naproxen (NAPROSYN) 500 MG tablet Take 1 tablet (500 mg total) by mouth 2 (two) times daily. Patient not taking: Reported on 07/04/2016 04/11/15   Joycie Peek, PA-C  neomycin-polymyxin-hydrocortisone (CORTISPORIN) 3.5-10000-1 otic suspension Place 4 drops into the left ear 3 (three) times daily. Patient not taking: Reported on 07/04/2016 07/21/15   [name], PA-C  nitroGLYCERIN (NITROSTAT) 0.4 MG SL tablet Place 1 tablet (0.4 mg total) under the tongue every 5 (five) minutes as needed. 07/04/16   Arty Baumgartner, NP  oxyCODONE-acetaminophen (PERCOCET/ROXICET) 5-325 MG tablet Take 1 tablet  by mouth 2 (two) times daily.    [provider]    Allergies:   Patient has no known allergies.   Social History   Social History  . Marital status: Single    Spouse name: N/A  . Number of children: N/A  . Years of education: N/A   Social History Main Topics  . Smoking status: Current Every Day Smoker    Packs/day: 0.50     Types: Cigarettes  . Smokeless tobacco: Never Used  . Alcohol use Yes     Comment: a beer a day  . Drug use: No  . Sexual activity: Not Asked   Other Topics Concern  . None   Social History Narrative  . None     Family History:  The patient's family history is not on file.   ROS:   Please see the history of present illness.    ROS All other systems reviewed and are negative.   PHYSICAL EXAM:   VS:  BP 118/70   Pulse 80   Ht 5\' 4"  (1.626 m)   Wt 207 lb 12.8 oz (94.3 kg)   BMI 35.67 kg/m    GEN: Well nourished, well developed, in no acute distress  HEENT: normal  Neck: no JVD, carotid bruits, or masses Cardiac:RRR; no murmurs, rubs, or gallops,no edema  Respiratory:  clear to auscultation bilaterally, normal work of breathing GI: soft, nontender, nondistended, + BS MS: no deformity or atrophy  Skin: warm and dry, no rash Neuro:  Alert and Oriented x 3, Strength and sensation are intact Psych: euthymic mood, full affect  Wt Readings from Last 3 Encounters:  07/24/16 207 lb 12.8 oz (94.3 kg)  07/04/16 225 lb (102.1 kg)  07/21/15 217 lb (98.4 kg)      Studies/Labs Reviewed:   EKG:  EKG is not ordered today.    Recent Labs: 07/04/2016: ALT 18; BUN 11; Creatinine, Ser 1.15; Hemoglobin 13.2; Platelets 228; Potassium 4.3; Sodium 137   Lipid Panel No results found for: CHOL, TRIG, HDL, CHOLHDL, VLDL, LDLCALC, LDLDIRECT  Additional studies/ records that were reviewed today include:   As above   ASSESSMENT & PLAN:    1. Unstable angina - Declined admission and inpatient cath during ER visit. His symptoms has been worsened. Now present at rest as well. He hasn't started any medication as recommended. He has declined direct admission and evaluation in ER today. After long discussion he is angry with outpatient cardiac catheterization tomorrow with Dr. Excell Seltzerooper. He will start taking aspirin 324 mg daily. He will need social worker and case manager consult in hospital.  Advised importance of medical compliance if his stent placement required.  The patient understands that risks include but are not limited to stroke (1 in 1000), death (1 in 1000), kidney failure [usually temporary] (1 in 500), bleeding (1 in 200), allergic reaction [possibly serious] (1 in 200), and agrees to proceed.   2. Tobacco smoking - Advised cessation. Location given. Low insight.  3. Hypertension - Currently well-controlled on lisinopril-hydrochlorothiazide 20/25 mg daily.   Medication Adjustments/Labs and Tests Ordered: Current medicines are reviewed at length with the patient today.  Concerns regarding medicines are outlined above.  Medication changes, Labs and Tests ordered today are listed in the Patient Instructions below. Patient Instructions  Medication Instructions:    TAKE  ASPIRIN 325 MG  TODAY AND TOMORROW  ( CAN BUY OVER THE COUNTER)  If you need a refill on your cardiac medications before your next  appointment, please call your pharmacy.  Labwork: CBC BMET PTINR  TODAY    Testing/Procedures: SEE LETTER FOR LEFT HEART CATH ON 07-25-16   Follow-Up: BASED UPON [facility] RESULTS   Any Other Special Instructions Will Be Listed Below (If Applicable).                                                                                                                                                  Lorelei Pont-    Signed, Lucion Dilger, PA  07/24/2016 9:54 AM    [facility] [address], [place], [place]  [address] Phone: [phone]; Fax: [phone]

## 2016-07-25 NOTE — Discharge Instructions (Signed)
Radial Site Care Refer to this sheet in the next few weeks. These instructions provide you with information about caring for yourself after your procedure. Your health care provider may also give you more specific instructions. Your treatment has been planned according to current medical practices, but problems sometimes occur. Call your health care provider if you have any problems or questions after your procedure. What can I expect after the procedure? After your procedure, it is typical to have the following:  Bruising at the radial site that usually fades within 1-2 weeks.  Blood collecting in the tissue (hematoma) that may be painful to the touch. It should usually decrease in size and tenderness within 1-2 weeks. Follow these instructions at home:  Take medicines only as directed by your health care provider.  You may shower 24-48 hours after the procedure or as directed by your health care provider. Remove the bandage (dressing) and gently wash the site with plain soap and water. Pat the area dry with a clean towel. Do not rub the site, because this may cause bleeding.  Do not take baths, swim, or use a hot tub until your health care provider approves.  Check your insertion site every day for redness, swelling, or drainage.  Do not apply powder or lotion to the site.  Do not flex or bend the affected arm for 24 hours or as directed by your health care provider.  Do not push or pull heavy objects with the affected arm for 24 hours or as directed by your health care provider.  Do not lift over 10 lb (4.5 kg) for 5 days after your procedure or as directed by your health care provider.  Ask your health care provider when it is okay to:  Return to work or school.  Resume usual physical activities or sports.  Resume sexual activity.  Do not drive home if you are discharged the same day as the procedure. Have someone else drive you.  You may drive 24 hours after the procedure  unless otherwise instructed by your health care provider.  Do not operate machinery or power tools for 24 hours after the procedure.  If your procedure was done as an outpatient procedure, which means that you went home the same day as your procedure, a responsible adult should be with you for the first 24 hours after you arrive home.  Keep all follow-up visits as directed by your health care provider. This is important. Contact a health care provider if:  You have a fever.  You have chills.  You have increased bleeding from the radial site. Hold pressure on the site. Get help right away if:  You have unusual pain at the radial site.  You have redness, warmth, or swelling at the radial site.  You have drainage (other than a small amount of blood on the dressing) from the radial site.  The radial site is bleeding, and the bleeding does not stop after 30 minutes of holding steady pressure on the site.  Your arm or hand becomes pale, cool, tingly, or numb. This information is not intended to replace advice given to you by your health care provider. Make sure you discuss any questions you have with your health care provider. Document Released: 03/17/2010 Document Revised: 07/21/2015 Document Reviewed: 08/31/2013 Elsevier Interactive Patient Education  2017 [employer]    Fat and Cholesterol Restricted Diet Getting too much fat and cholesterol in your diet may cause health problems. Following this diet helps keep your  fat and cholesterol at normal levels. This can keep you from getting sick. What types of fat should I choose?  Choose monosaturated and polyunsaturated fats. These are found in foods such as olive oil, canola oil, flaxseeds, walnuts, almonds, and seeds.  Eat more omega-3 fats. Good choices include salmon, mackerel, sardines, tuna, flaxseed oil, and ground flaxseeds.  Limit saturated fats. These are in animal products such as meats, butter, and cream. They can also  be in plant products such as palm oil, palm kernel oil, and coconut oil.  Avoid foods with partially hydrogenated oils in them. These contain trans fats. Examples of foods that have trans fats are stick margarine, some tub margarines, cookies, crackers, and other baked goods. What general guidelines do I need to follow?  Check food labels. Look for the words "trans fat" and "saturated fat."  When preparing a meal:  Fill half of your plate with vegetables and green salads.  Fill one fourth of your plate with whole grains. Look for the word "whole" as the first word in the ingredient list.  Fill one fourth of your plate with lean protein foods.  Eat more foods that have fiber, like apples, carrots, beans, peas, and barley.  Eat more home-cooked foods. Eat less at restaurants and buffets.  Limit or avoid alcohol.  Limit foods high in starch and sugar.  Limit fried foods.  Cook foods without frying them. Baking, boiling, grilling, and broiling are all great options.  Lose weight if you are overweight. Losing even a small amount of weight can help your overall health. It can also help prevent diseases such as diabetes and heart disease. What foods can I eat? Grains  Whole grains, such as whole wheat or whole grain breads, crackers, cereals, and pasta. Unsweetened oatmeal, bulgur, barley, quinoa, or brown rice. Corn or whole wheat flour tortillas. Vegetables  Fresh or frozen vegetables (raw, steamed, roasted, or grilled). Green salads. Fruits  All fresh, canned (in natural juice), or frozen fruits. Meat and Other Protein Products  Ground beef (85% or leaner), grass-fed beef, or beef trimmed of fat. Skinless chicken or [place]. Ground chicken or [place]. Pork trimmed of fat. All fish and seafood. Eggs. Dried beans, peas, or lentils. Unsalted nuts or seeds. Unsalted canned or dry beans. Dairy  Low-fat dairy products, such as skim or 1% milk, 2% or reduced-fat cheeses, low-fat ricotta or  cottage cheese, or plain low-fat yogurt. Fats and Oils  Tub margarines without trans fats. Light or reduced-fat mayonnaise and salad dressings. Avocado. Olive, canola, sesame, or safflower oils. Natural peanut or almond butter (choose ones without added sugar and oil). The items listed above may not be a complete list of recommended foods or beverages. Contact your dietitian for more options.  What foods are not recommended? Grains  White bread. White pasta. White rice. Cornbread. Bagels, pastries, and croissants. Crackers that contain trans fat. Vegetables  White potatoes. Corn. Creamed or fried vegetables. Vegetables in a cheese sauce. Fruits  Dried fruits. Canned fruit in light or heavy syrup. Fruit juice. Meat and Other Protein Products  Fatty cuts of meat. Ribs, chicken wings, bacon, sausage, bologna, salami, chitterlings, fatback, hot dogs, bratwurst, and packaged luncheon meats. Liver and organ meats. Dairy  Whole or 2% milk, cream, half-and-half, and cream cheese. Whole milk cheeses. Whole-fat or sweetened yogurt. Full-fat cheeses. Nondairy creamers and whipped toppings. Processed cheese, cheese spreads, or cheese curds. Sweets and Desserts  Corn syrup, sugars, honey, and molasses. Candy. Jam and jelly. Syrup.  Sweetened cereals. Cookies, pies, cakes, donuts, muffins, and ice cream. Fats and Oils  Butter, stick margarine, lard, shortening, ghee, or bacon fat. Coconut, palm kernel, or palm oils. Beverages  Alcohol. Sweetened drinks (such as sodas, lemonade, and fruit drinks or punches). The items listed above may not be a complete list of foods and beverages to avoid. Contact your dietitian for more information.  This information is not intended to replace advice given to you by your health care provider. Make sure you discuss any questions you have with your health care provider. Document Released: 08/14/2011 Document Revised: 10/20/2015 Document Reviewed: 05/14/2013 Elsevier  Interactive Patient Education  2017 [employer]

## 2016-07-25 NOTE — Interval H&P Note (Signed)
Cath Lab Visit (complete for each Cath Lab visit)  Clinical Evaluation Leading to the Procedure:   ACS: Yes.    Non-ACS:    Anginal Classification: CCS III  Anti-ischemic medical therapy: No Therapy  Non-Invasive Test Results: No non-invasive testing performed  Prior CABG: No previous CABG      History and Physical Interval Note:  07/25/2016 4:50 PM  Kevin Barton  has presented today for surgery, with the diagnosis of unstable angina  The various methods of treatment have been discussed with the patient and family. After consideration of risks, benefits and other options for treatment, the patient has consented to  Procedure(s): Left Heart Cath and Coronary Angiography (N/A) as a surgical intervention .  The patient's history has been reviewed, patient examined, no change in status, stable for surgery.  I have reviewed the patient's chart and labs.  Questions were answered to the patient's satisfaction.     Tonny Bollmanooper, Nami Strawder

## 2016-07-26 ENCOUNTER — Encounter (HOSPITAL_COMMUNITY): Payer: Self-pay | Admitting: Cardiovascular Disease

## 2016-07-26 ENCOUNTER — Telehealth: Payer: Self-pay

## 2016-07-26 NOTE — Telephone Encounter (Signed)
Patient contacted regarding discharge [facility]p [facility] 07/25/2016  Patient understands to follow up with provider:  follow up appt made with Bhagat PA for August 09, 2016 @ 8:30 am Pt understands discharge instructions?  yes Pt understands medication regimen? Yes  Call placed to Pt.  Per review of Pt chart, Pt with no PCP and no follow up appt post heart cath.  Asked Pt if he would like to continue to be seen by this office d/t Pt with occluded RCA and possible continued heart care needs.  Pt states he would like to continue with office.  Scheduled appt to be seen by PA.  Pt indicates understanding.  Pt denies any issues with wrist site following cath.  Requested Pt continue to monitor site and call office for any s/s of infection, increased pain, swelling.  Pt indicates understanding.

## 2016-08-07 ENCOUNTER — Encounter: Payer: Self-pay | Admitting: Physician Assistant

## 2016-08-07 NOTE — Progress Notes (Signed)
Cardiology Office Note    Date:  08/09/2016   ID:  Kevin Barton, DOB 09/14/1960, MRN [id number]  PCP:  Patient, No Pcp Per  Cardiologist:  Dr. Excell Seltzerooper  Chief Complaint: Cath follow up   History of Present Illness:   Kevin Barton is a 56 y.o. male with PMHx of osteoarthritis, HTN, tobacco use, daily EtOH (drinks 40oz beer daily) and recent diagnosis of CAD present for cath follow up.   Seen in ER 07/04/16 for centrally located, sharp yet squeezing pain after eating new taco mix. Initial labs fairly unremarkable except for POCT troponin of 0.04 then 0.09. EKG without acute ischemic changes but did indicate non-specific ST-changes. His blood pressure initially elevated to 180/87. He reports pain is improved with aspirin and GI cocktail. Dr. Excell Seltzerooper recommended admission with cath given risk factors and abnormal troponin. However he declined. He also declined outpatient cath. Started on ASA 325mg  daily, atorvastatin 80 mg and isosorbide 30 mg. a beta blocker is contraindicated because of resting bradycardia. Outpatient follow up scheduled with plan for outpatient cardiac catheterization and possible PCI even if he is asymptomatic because of his elevated enzymes making him high risk.   Seen by me in clinic 07/24/16. Continue to have intermittent chest pain. Hasn't started medications. Advised start medication and set up for outpatient cath that showed total occlusion of RCA with collateralization from the left coronary artery. Mild nonobstructive plaque in the left main, LAD, and LCx. LVEF 50%. Recommended medical therapy.   Here today follow up. He has cut back on tobacco smoking. He takes aspirin 81 mg daily with his antihypertensive. He hasn't started Imdur or Lipitor yet. Advised again to fill out a assistant program paperwork. His chest pain and dyspnea has improved significantly. He is now able to walk without discomfort. Denies orthopnea, PND, syncope, dizziness, lower extremity edema or  melena. He has a chronic right knee pain due to arthritis.  Past Medical History:  Diagnosis Date  . Arthritis   . CAD (coronary artery disease)    cath 06/2016 occlusion of RCA with collateralization from the left coronary artery. Mild nonobstructive plaque in the left main, LAD, and LCx. LVEF 50%. TX Rx.   . Chronic back pain   . Chronic knee pain   . HTN (hypertension)   . Tobacco abuse     Past Surgical History:  Procedure Laterality Date  . LEFT HEART CATH AND CORONARY ANGIOGRAPHY N/A 07/25/2016   Procedure: Left Heart Cath and Coronary Angiography;  Surgeon: Tonny Bollmanooper, Michael, MD;  [facility];  Service: Cardiovascular;  Laterality: N/A;    Current Medications: Prior to Admission medications   Medication Sig Start Date End Date Taking? Authorizing Provider  atorvastatin (LIPITOR) 80 MG tablet Take 1 tablet (80 mg total) by mouth daily. 07/04/16   Arty Baumgartneroberts, Lindsay B, NP  ibuprofen (ADVIL,MOTRIN) 800 MG tablet Take 800 mg by mouth every 8 (eight) hours as needed for moderate pain.    [provider]  isosorbide mononitrate (IMDUR) 30 MG 24 hr tablet Take 1 tablet (30 mg total) by mouth daily. 07/04/16   Arty Baumgartneroberts, Lindsay B, NP  lisinopril-hydrochlorothiazide (PRINZIDE,ZESTORETIC) 20-25 MG tablet Take 1 tablet by mouth daily.    [provider]  nitroGLYCERIN (NITROSTAT) 0.4 MG SL tablet Place 1 tablet (0.4 mg total) under the tongue every 5 (five) minutes as needed. 07/04/16   Arty Baumgartneroberts, Lindsay B, NP  oxyCODONE-acetaminophen (PERCOCET/ROXICET) 5-325 MG tablet Take 1 tablet by mouth 2 (two) times daily.  [provider]    Allergies:   Patient has no known allergies.   Social History   Social History  . Marital status: Single    Spouse name: N/A  . Number of children: N/A  . Years of education: N/A   Social History Main Topics  . Smoking status: Current Every Day Smoker    Packs/day: 0.50    Types: Cigarettes  . Smokeless tobacco: Never  Used  . Alcohol use Yes     Comment: a beer a day  . Drug use: No  . Sexual activity: Not Asked   Other Topics Concern  . None   Social History Narrative  . None     Family History:  The patient's family history includes Diabetes in his brother. No family history of CAD.  ROS:   Please see the history of present illness.    ROS All other systems reviewed and are negative.   PHYSICAL EXAM:   VS:  BP 128/70   Pulse (!) 57   Ht 5\' 4"  (1.626 m)   Wt 210 lb 12.8 oz (95.6 kg)   SpO2 98%   BMI 36.18 kg/m    GEN: Well nourished, well developed, in no acute distress  HEENT: normal  Neck: no JVD, carotid bruits, or masses Cardiac: RRR; no murmurs, rubs, or gallops,no edema  Respiratory:  clear to auscultation bilaterally, normal work of breathing GI: soft, nontender, nondistended, + BS MS: no deformity or atrophy  Skin: warm and dry, no rash Neuro:  Alert and Oriented x 3, Strength and sensation are intact Psych: euthymic mood, full affect  Wt Readings from Last 3 Encounters:  08/09/16 210 lb 12.8 oz (95.6 kg)  07/25/16 207 lb (93.9 kg)  07/24/16 207 lb 12.8 oz (94.3 kg)      Studies/Labs Reviewed:   EKG:  EKG is ordered today.  The ekg ordered today demonstrates NSR, T-wave inversion in lead 3 and aVF which is similar to prior EKG. Resolution of T wave inversion in lateral lead.  Recent Labs: 07/04/2016: ALT 18 07/25/2016: BUN 19; Creatinine, Ser 1.24; Hemoglobin 14.0; Platelets 251; Potassium 3.9; Sodium 135   Lipid Panel No results found for: CHOL, TRIG, HDL, CHOLHDL, VLDL, LDLCALC, LDLDIRECT  Additional studies/ records that were reviewed today include:    Cardiac Catheterization: 07/25/16 Left Heart Cath and Coronary Angiography  Conclusion   1. Severe single vessel CAD with total occlusion of the RCA, collateralized from the left coronary artery 2. Mild nonobstructive plaque in the left main, LAD, and LCx 3. Mild segmental LV dysfunction, LVEF  50%  Recommend: medical therapy    ASSESSMENT & PLAN:    1. CAD - cath showed total occlusion of RCA with collateralization from the left coronary artery. Mild nonobstructive plaque in the left main, LAD, and LCx. LVEF 50%. Recommended medical therapy. No chest pain or dyspnea. - Continue aspirin and ace. No beta blocker due to bradycardia. We will start Lipitor 80 mg.  2. HTN - Stable and well controlled on current regimen.  3. HLD - Start Lipitor 80 mg. LFT and lipid checked during follow-up.  4. Tobacco abuse - He has cut back on smoking. Advise complete cessation.  Medication Adjustments/Labs and Tests Ordered: Current medicines are reviewed at length with the patient today.  Concerns regarding medicines are outlined above.  Medication changes, Labs and Tests ordered today are listed in the Patient Instructions below. Patient Instructions  Medication Instructions:  Your physician has recommended you make  the following change in your medication: 1.) START Lipitor 80 mg daily.  Labwork: None Ordered   Testing/Procedures: None Ordered   Follow-Up: Your physician recommends that you schedule a follow-up appointment in: 3 months with Dr. Excell Seltzer  Any Other Special Instructions Will Be Listed Below (If Applicable).     If you need a refill on your cardiac medications before your next appointment, please call your pharmacy.  Thank you for choosing [facility]  Corrine CMA Christ Kick, [place]  08/09/2016 8:47 AM    [facility] [address] [place], [place], [place]  [address] Phone: [phone]; Fax: [phone]

## 2016-08-09 ENCOUNTER — Ambulatory Visit (INDEPENDENT_AMBULATORY_CARE_PROVIDER_SITE_OTHER): Payer: Self-pay | Admitting: Physician Assistant

## 2016-08-09 ENCOUNTER — Encounter: Payer: Self-pay | Admitting: Physician Assistant

## 2016-08-09 VITALS — BP 128/70 | HR 57 | Ht 64.0 in | Wt 210.8 lb

## 2016-08-09 DIAGNOSIS — I10 Essential (primary) hypertension: Secondary | ICD-10-CM

## 2016-08-09 DIAGNOSIS — E785 Hyperlipidemia, unspecified: Secondary | ICD-10-CM

## 2016-08-09 DIAGNOSIS — Z720 Tobacco use: Secondary | ICD-10-CM

## 2016-08-09 DIAGNOSIS — I214 Non-ST elevation (NSTEMI) myocardial infarction: Secondary | ICD-10-CM

## 2016-08-09 DIAGNOSIS — I25110 Atherosclerotic heart disease of native coronary artery with unstable angina pectoris: Secondary | ICD-10-CM

## 2016-08-09 MED ORDER — ATORVASTATIN CALCIUM 80 MG PO TABS: 80.0000 mg | ORAL_TABLET | Freq: Every day | ORAL | 3 refills | Status: DC

## 2016-08-09 NOTE — Patient Instructions (Signed)
Medication Instructions:  Your physician has recommended you make the following change in your medication: 1.) START Lipitor 80 mg daily.  Labwork: None Ordered   Testing/Procedures: None Ordered   Follow-Up: Your physician recommends that you schedule a follow-up appointment in: 3 months with Dr. Excell Seltzerooper  Any Other Special Instructions Will Be Listed Below (If Applicable).     If you need a refill on your cardiac medications before your next appointment, please call your pharmacy.  Thank you for choosing [facility]  Corrine CMA AAMA

## 2016-10-25 ENCOUNTER — Ambulatory Visit (INDEPENDENT_AMBULATORY_CARE_PROVIDER_SITE_OTHER): Payer: Self-pay | Admitting: Cardiovascular Disease

## 2016-10-25 ENCOUNTER — Encounter: Payer: Self-pay | Admitting: Cardiovascular Disease

## 2016-10-25 VITALS — BP 110/58 | HR 54 | Ht 64.0 in | Wt 201.4 lb

## 2016-10-25 DIAGNOSIS — E7849 Other hyperlipidemia: Secondary | ICD-10-CM

## 2016-10-25 DIAGNOSIS — E784 Other hyperlipidemia: Secondary | ICD-10-CM

## 2016-10-25 MED ORDER — NITROGLYCERIN 0.4 MG SL SUBL: 0.4000 mg | SUBLINGUAL_TABLET | SUBLINGUAL | 3 refills | Status: DC | PRN

## 2016-10-25 MED ORDER — SIMVASTATIN 40 MG PO TABS: 40.0000 mg | ORAL_TABLET | Freq: Every day | ORAL | 11 refills | Status: DC

## 2016-10-25 NOTE — Patient Instructions (Signed)
Medication Instructions:  Your physician has recommended you make the following change in your medication:  Stop atorvastatin. Start simvastatin 40 mg by mouth daily   Labwork: Lab work to be done today--Lipid and Liver profiles.   Testing/Procedures: none  Follow-Up: Your physician wants you to follow-up in: 6 months with PA or NP.  You will receive a reminder letter in the mail two months in advance. If you don't receive a letter, please call our office to schedule the follow-up appointment.   Any Other Special Instructions Will Be Listed Below (If Applicable).     If you need a refill on your cardiac medications before your next appointment, please call your pharmacy.

## 2016-10-25 NOTE — Progress Notes (Signed)
Cardiology Office Note Date:  10/25/2016   ID:  Kevin RiasDarryl Barton, DOB [birth year], MRN [id number]  PCP:  Patient, No Pcp Per  Cardiologist:  Tonny Bollmanooper, Temprence Rhines, MD    Chief Complaint  Patient presents with  . Follow-up     History of Present Illness: Kevin Barton is a 56 y.o. male who presents for follow-up of CAD. The patient is a one half pack per day smoker. He drinks 40 ounces of Miller beer daily. He has a history of hypertension. The patient presented with symptoms concerning for unstable angina back in May 2018. He ultimately underwent cardiac catheterization demonstrating total occlusion of the RCA with left-to-right collaterals and nonobstructive left-sided disease. Medical therapy has been recommended.  The patient is here alone today. He is taking his medicines but medication cost is difficult for him. He has had a few twinges of pain in the chest but nothing like what he was experiencing before. Overall states that he feels very well. Denies shortness of breath, edema, or palpitations. No exertional chest pain or pressure.  Past Medical History:  Diagnosis Date  . Arthritis   . CAD (coronary artery disease)    cath 06/2016 occlusion of RCA with collateralization from the left coronary artery. Mild nonobstructive plaque in the left main, LAD, and LCx. LVEF 50%. TX Rx.   . Chronic back pain   . Chronic knee pain   . HTN (hypertension)   . Tobacco abuse     Past Surgical History:  Procedure Laterality Date  . LEFT HEART CATH AND CORONARY ANGIOGRAPHY N/A 07/25/2016   Procedure: Left Heart Cath and Coronary Angiography;  Surgeon: Tonny Bollmanooper, Roald Lukacs, MD;  [facility];  Service: Cardiovascular;  Laterality: N/A;    Current Outpatient Prescriptions  Medication Sig Dispense Refill  . aspirin EC 81 MG tablet Take 81 mg by mouth daily.    Marland Kitchen. ibuprofen (ADVIL,MOTRIN) 800 MG tablet Take 800 mg by mouth every 8 (eight) hours as needed for moderate pain.    Marland Kitchen.  lisinopril-hydrochlorothiazide (PRINZIDE,ZESTORETIC) 20-25 MG tablet Take 1 tablet by mouth daily.    . nitroGLYCERIN (NITROSTAT) 0.4 MG SL tablet Place 1 tablet (0.4 mg total) under the tongue every 5 (five) minutes as needed for chest pain. 25 tablet 3  . oxyCODONE-acetaminophen (PERCOCET/ROXICET) 5-325 MG tablet Take 1 tablet by mouth 2 (two) times daily.    . simvastatin (ZOCOR) 40 MG tablet Take 1 tablet (40 mg total) by mouth at bedtime. 30 tablet 11   No current facility-administered medications for this visit.     Allergies:   Patient has no known allergies.   Social History:  The patient  reports that he has been smoking Cigarettes.  He has been smoking about 0.50 packs per day. He has never used smokeless tobacco. He reports that he drinks alcohol. He reports that he does not use drugs.   Family History:  The patient's  family history includes Diabetes in his brother.    ROS:  Please see the history of present illness. All other systems are reviewed and negative.    PHYSICAL EXAM: VS:  BP (!) 110/58   Pulse (!) 54   Ht 5\' 4"  (1.626 m)   Wt 201 lb 6.4 oz (91.4 kg)   BMI 34.57 kg/m  , BMI Body mass index is 34.57 kg/m. GEN: Well nourished, well developed, in no acute distress  HEENT: normal  Neck: no JVD, no masses. No carotid bruits Cardiac: RRR without murmur or gallop  Respiratory:  clear to auscultation bilaterally, normal work of breathing GI: soft, nontender, nondistended, + BS MS: no deformity or atrophy  Ext: no pretibial edema, pedal pulses 2+= bilaterally Skin: warm and dry, no rash Neuro:  Strength and sensation are intact Psych: euthymic mood, full affect  EKG:  EKG is not ordered today.  Recent Labs: 07/04/2016: ALT 18 07/25/2016: BUN 19; Creatinine, Ser 1.24; Hemoglobin 14.0; Platelets 251; Potassium 3.9; Sodium 135   Lipid Panel  No results found for: CHOL, TRIG, HDL, CHOLHDL, VLDL, LDLCALC, LDLDIRECT    Wt Readings from Last 3  Encounters:  10/25/16 201 lb 6.4 oz (91.4 kg)  08/09/16 210 lb 12.8 oz (95.6 kg)  07/25/16 207 lb (93.9 kg)     Cardiac Studies Reviewed: Cardiac Cath 07/25/2016: Conclusion   1. Severe single vessel CAD with total occlusion of the RCA, collateralized from the left coronary artery 2. Mild nonobstructive plaque in the left main, LAD, and LCx 3. Mild segmental LV dysfunction, LVEF 50%  Recommend: medical therapy  Indications   Coronary artery disease involving native coronary artery of native heart with angina pectoris (HCC) [I25.119 (ICD-10-CM)]  Procedural Details/Technique   Technical Details INDICATION: Recent ACS presentation with elevated troponin (pt left the ER AMA). Returns for cardiac catheterization after office evaluation and intermittent chest pain.  PROCEDURAL DETAILS: The right wrist was prepped, draped, and anesthetized with 1% lidocaine. Using the modified Seldinger technique, a 5/6 French Slender sheath was introduced into the right radial artery. 3 mg of verapamil was administered through the sheath, weight-based unfractionated heparin was administered intravenously. Standard Judkins catheters were used for selective coronary angiography and left ventriculography. Catheter exchanges were performed over an exchange length guidewire. There were no immediate procedural complications. A TR band was used for radial hemostasis at the completion of the procedure. The patient was transferred to the post catheterization recovery area for further monitoring.    Estimated blood loss <50 mL.  During this procedure the patient was administered the following to achieve and maintain moderate conscious sedation: Versed 2 mg, Fentanyl 25 mcg, while the patient's heart rate, blood pressure, and oxygen saturation were continuously monitored. The period of conscious sedation was 45 minutes, of which I was present face-to-face 100% of this time.    Coronary Findings   Dominance: Right    Left Anterior Descending  There is mild the vessel.  Ramus Intermedius  There is mild the vessel.  Left Circumflex  Ost Cx to Prox Cx lesion, 30% stenosed. mild ostial narrowing  Right Coronary Artery  Dist RCA filled by collaterals from 1st Sept.  Mid RCA lesion, 100% stenosed.  Inferior Septal  Inf Sept filled by collaterals from 1st Sept.  Wall Motion              Left Heart   Left Ventricle The left ventricular size is normal. There is mild left ventricular systolic dysfunction. The left ventricular ejection fraction is 45-50% by visual estimate. There are LV function abnormalities due to segmental dysfunction.    Coronary Diagrams   Diagnostic Diagram          ASSESSMENT AND PLAN: 1.  Coronary artery disease, native vessel, without angina: The patient is doing well without recurrent chest discomfort. We have recommended he take aspirin 81 mg daily. His blood pressure is well controlled. He is bradycardic and we cannot prescribe him a beta blocker. Will change his statin drug because of cost issues. See below.  2. Hyperlipidemia: Change from atorvastatin  to simvastatin 40 mg daily. Check lipids today. Check LFTs today. Lifestyle modification counseling done.  3. Hypertension: Blood pressure is well controlled on lisinopril/hydrochlorothiazide.  Current medicines are reviewed with the patient today.  The patient does not have concerns regarding medicines.  Labs/ tests ordered today include:   Orders Placed This Encounter  Procedures  . Lipid Profile  . Hepatic function panel   Disposition:   FU 6 months with APP  Signed, Tonny Bollman, MD  10/25/2016 6:09 PM    [facility] [address] [place], [place], [place]  [address] Phone: [phone]; Fax: [phone]

## 2016-10-26 LAB — HEPATIC FUNCTION PANEL
ALBUMIN: 4.4 g/dL (ref 3.5–5.5)
ALT: 26 IU/L (ref 0–44)
AST: 31 IU/L (ref 0–40)
Alkaline Phosphatase: 73 IU/L (ref 39–117)
BILIRUBIN, DIRECT: 0.07 mg/dL (ref 0.00–0.40)
Bilirubin Total: <0.2 mg/dL (ref 0.0–1.2)
Total Protein: 7.5 g/dL (ref 6.0–8.5)

## 2016-10-26 LAB — LIPID PANEL
CHOL/HDL RATIO: 3.1 ratio (ref 0.0–5.0)
CHOLESTEROL TOTAL: 196 mg/dL (ref 100–199)
HDL: 64 mg/dL (ref >39)
LDL Calculated: 91 mg/dL (ref 0–99)
TRIGLYCERIDES: 206 mg/dL — AB (ref 0–149)
VLDL Cholesterol Cal: 41 mg/dL — ABNORMAL HIGH (ref 5–40)

## 2016-11-02 ENCOUNTER — Telehealth: Payer: Self-pay

## 2016-11-02 DIAGNOSIS — I214 Non-ST elevation (NSTEMI) myocardial infarction: Secondary | ICD-10-CM

## 2016-11-02 NOTE — Telephone Encounter (Signed)
-----   Message from Tonny BollmanMichael Cooper, MD sent at 10/30/2016  2:03 PM EDT ----- Changed from atorvastatin to simvastatin for cost. Needs to work on diet/exercise. Repeat labs 6 months. thx

## 2016-11-02 NOTE — Telephone Encounter (Addendum)
Informed patient of results and verbal understanding expressed.  Patient has not started simvastatin yet (he will pick it up Monday). Reiterated to patient the importance of good diet and exercise. Recall placed for fasting blood work in 6 months. Patient understands he will get a letter to call and arrange lab appointment. He was grateful for call and agrees with treatment plan.

## 2017-04-22 NOTE — Progress Notes (Deleted)
Cardiology Office Note    Date:  04/22/2017   ID:  Kevin Barton, DOB 28-Sep-1960, MRN [id number]  PCP:  Patient, No Pcp Per  Cardiologist: Dr. Excell Seltzerooper  Chief Complaint: 6 months follow up  History of Present Illness:   Kevin Barton is a 57 y.o. male with PMHx of osteoarthritis, HTN, tobacco use, daily EtOH (drinks 40oz beer daily) and CAD presents for follow up.   Seen for symptoms concerning of unstable angina May 2018. He ultimately underwent cardiac catheterization demonstrating total occlusion of the RCA with left-to-right collaterals and nonobstructive left-sided disease. Medical therapy has been recommended. / He was going well on cardiac stand point when last seen by Dr. Excell Seltzerooper 10/25/16. Not on BB due to bradycardia.   Here today for follow up.     Past Medical History:  Diagnosis Date  . Arthritis   . CAD (coronary artery disease)    cath 06/2016 occlusion of RCA with collateralization from the left coronary artery. Mild nonobstructive plaque in the left main, LAD, and LCx. LVEF 50%. TX Rx.   . Chronic back pain   . Chronic knee pain   . HTN (hypertension)   . Tobacco abuse     Past Surgical History:  Procedure Laterality Date  . LEFT HEART CATH AND CORONARY ANGIOGRAPHY N/A 07/25/2016   Procedure: Left Heart Cath and Coronary Angiography;  Surgeon: Tonny Bollmanooper, Michael, MD;  [facility];  Service: Cardiovascular;  Laterality: N/A;    Current Medications: Prior to Admission medications   Medication Sig Start Date End Date Taking? Authorizing Provider  aspirin EC 81 MG tablet Take 81 mg by mouth daily.    [provider]  ibuprofen (ADVIL,MOTRIN) 800 MG tablet Take 800 mg by mouth every 8 (eight) hours as needed for moderate pain.    [provider]  lisinopril-hydrochlorothiazide (PRINZIDE,ZESTORETIC) 20-25 MG tablet Take 1 tablet by mouth daily.    [provider]  nitroGLYCERIN (NITROSTAT) 0.4 MG SL tablet Place 1 tablet  (0.4 mg total) under the tongue every 5 (five) minutes as needed for chest pain. 10/25/16   Tonny Bollmanooper, Michael, MD  oxyCODONE-acetaminophen (PERCOCET/ROXICET) 5-325 MG tablet Take 1 tablet by mouth 2 (two) times daily.    [provider]  simvastatin (ZOCOR) 40 MG tablet Take 1 tablet (40 mg total) by mouth at bedtime. 10/25/16 01/23/17  Tonny Bollmanooper, Michael, MD    Allergies:   Patient has no known allergies.   Social History   Socioeconomic History  . Marital status: Single    Spouse name: Not on file  . Number of children: Not on file  . Years of education: Not on file  . Highest education level: Not on file  Social Needs  . Financial resource strain: Not on file  . Food insecurity - worry: Not on file  . Food insecurity - inability: Not on file  . Transportation needs - medical: Not on file  . Transportation needs - non-medical: Not on file  Occupational History  . Not on file  Tobacco Use  . Smoking status: Current Every Day Smoker    Packs/day: 0.50    Types: Cigarettes  . Smokeless tobacco: Never Used  Substance and Sexual Activity  . Alcohol use: Yes    Comment: a beer a day  . Drug use: No  . Sexual activity: Not on file  Other Topics Concern  . Not on file  Social History Narrative  . Not on file     Family  History:  The patient's family history includes Diabetes in his brother. ***  ROS:   Please see the history of present illness.    ROS All other systems reviewed and are negative.   PHYSICAL EXAM:   VS:  There were no vitals taken for this visit.   GEN: Well nourished, well developed, in no acute distress  HEENT: normal  Neck: no JVD, carotid bruits, or masses Cardiac: ***RRR; no murmurs, rubs, or gallops,no edema  Respiratory:  clear to auscultation bilaterally, normal work of breathing GI: soft, nontender, nondistended, + BS MS: no deformity or atrophy  Skin: warm and dry, no rash Neuro:  Alert and Oriented x 3, Strength and sensation are  intact Psych: euthymic mood, full affect  Wt Readings from Last 3 Encounters:  10/25/16 201 lb 6.4 oz (91.4 kg)  08/09/16 210 lb 12.8 oz (95.6 kg)  07/25/16 207 lb (93.9 kg)      Studies/Labs Reviewed:   EKG:  EKG is ordered today.  The ekg ordered today demonstrates ***  Recent Labs: 07/25/2016: BUN 19; Creatinine, Ser 1.24; Hemoglobin 14.0; Platelets 251; Potassium 3.9; Sodium 135 10/25/2016: ALT 26   Lipid Panel    Component Value Date/Time   CHOL 196 10/25/2016 1522   TRIG 206 (H) 10/25/2016 1522   HDL 64 10/25/2016 1522   CHOLHDL 3.1 10/25/2016 1522   LDLCALC 91 10/25/2016 1522    Additional studies/ records that were reviewed today include:   Cardiac Catheterization: 07/25/16 Left Heart Cath and Coronary Angiography  Conclusion   1. Severe single vessel CAD with total occlusion of the RCA, collateralized from the left coronary artery 2. Mild nonobstructive plaque in the left main, LAD, and LCx 3. Mild segmental LV dysfunction, LVEF 50%  Recommend: medical therapy     ASSESSMENT & PLAN:    1. CAD - medical therapy  2. HTN  3. HLD - 10/25/2016: Cholesterol, Total 196; HDL 64; LDL Calculated 91; Triglycerides 206    Medication Adjustments/Labs and Tests Ordered: Current medicines are reviewed at length with the patient today.  Concerns regarding medicines are outlined above.  Medication changes, Labs and Tests ordered today are listed in the Patient Instructions below. There are no Patient Instructions on file for this visit.   Lorelei Pont, [place]  04/22/2017 11:12 AM    [facility] [address] [place], [place], [place]  [address] Phone: [phone]; Fax: [phone]

## 2017-04-24 ENCOUNTER — Ambulatory Visit: Payer: Self-pay | Admitting: Physician Assistant

## 2017-04-28 DIAGNOSIS — I10 Essential (primary) hypertension: Secondary | ICD-10-CM | POA: Insufficient documentation

## 2017-04-28 DIAGNOSIS — E785 Hyperlipidemia, unspecified: Secondary | ICD-10-CM | POA: Insufficient documentation

## 2017-04-28 DIAGNOSIS — Z720 Tobacco use: Secondary | ICD-10-CM | POA: Insufficient documentation

## 2017-04-28 DIAGNOSIS — I2510 Atherosclerotic heart disease of native coronary artery without angina pectoris: Secondary | ICD-10-CM | POA: Insufficient documentation

## 2017-04-28 NOTE — Progress Notes (Deleted)
Cardiology Office Note:    Date:  04/28/2017   ID:  Kevin Barton, DOB August 08, 1960, MRN [id number]  PCP:  Patient, No Pcp Per  Cardiologist:  Tonny Bollman, MD  Referring MD: No ref. provider found   No chief complaint on file. ***  History of Present Illness:    Kevin Barton is a 57 y.o. male with a past medical history significant for CAD with CTO of RCA 06/2016, hypertension, half pack per day smoker.  The patient is here today***for follow-up    CAD: Aspirin, ACE inhibitor, statin, not on beta-blocker due to bradycardia. Hypertension: Lisinopril/hydrochlorothiazide Hyperlipidemia: Atorvastatin changed to simvastatin due to cost in September/2018.  Will repeat lipid panel Tobacco abuse   Past Medical History:  Diagnosis Date  . Arthritis   . CAD (coronary artery disease)    cath 06/2016 occlusion of RCA with collateralization from the left coronary artery. Mild nonobstructive plaque in the left main, LAD, and LCx. LVEF 50%. TX Rx.   . Chronic back pain   . Chronic knee pain   . HTN (hypertension)   . Tobacco abuse     Past Surgical History:  Procedure Laterality Date  . LEFT HEART CATH AND CORONARY ANGIOGRAPHY N/A 07/25/2016   Procedure: Left Heart Cath and Coronary Angiography;  Surgeon: Tonny Bollman, MD;  Location: [facility] INVASIVE CV LAB;  Service: Cardiovascular;  Laterality: N/A;    Current Medications: No outpatient medications have been marked as taking for the 04/29/17 encounter (Appointment) with Berton Bon, NP.     Allergies:   Patient has no known allergies.   Social History   Socioeconomic History  . Marital status: Single    Spouse name: Not on file  . Number of children: Not on file  . Years of education: Not on file  . Highest education level: Not on file  Social Needs  . Financial resource strain: Not on file  . Food insecurity - worry: Not on file  . Food insecurity - inability: Not on file  . Transportation needs - medical: Not on file    . Transportation needs - non-medical: Not on file  Occupational History  . Not on file  Tobacco Use  . Smoking status: Current Every Day Smoker    Packs/day: 0.50    Types: Cigarettes  . Smokeless tobacco: Never Used  Substance and Sexual Activity  . Alcohol use: Yes    Comment: a beer a day  . Drug use: No  . Sexual activity: Not on file  Other Topics Concern  . Not on file  Social History Narrative  . Not on file     Family History: The patient's ***family history includes Diabetes in his brother. ROS:   Please see the history of present illness.    *** All other systems reviewed and are negative.  EKGs/Labs/Other Studies Reviewed:    The following studies were reviewed today:  Left Heart Cath and Coronary Angiography  07/25/2016  Conclusion   1. Severe single vessel CAD with total occlusion of the RCA, collateralized from the left coronary artery 2. Mild nonobstructive plaque in the left main, LAD, and LCx 3. Mild segmental LV dysfunction, LVEF 50%  Recommend: medical therapy     EKG:  EKG is *** ordered today.  The ekg ordered today demonstrates ***  Recent Labs: 07/25/2016: BUN 19; Creatinine, Ser 1.24; Hemoglobin 14.0; Platelets 251; Potassium 3.9; Sodium 135 10/25/2016: ALT 26   Recent Lipid Panel    Component Value Date/Time  CHOL 196 10/25/2016 1522   TRIG 206 (H) 10/25/2016 1522   HDL 64 10/25/2016 1522   CHOLHDL 3.1 10/25/2016 1522   LDLCALC 91 10/25/2016 1522    Physical Exam:    VS:  There were no vitals taken for this visit.    Wt Readings from Last 3 Encounters:  10/25/16 201 lb 6.4 oz (91.4 kg)  08/09/16 210 lb 12.8 oz (95.6 kg)  07/25/16 207 lb (93.9 kg)     Physical Exam***   ASSESSMENT:    No diagnosis found. PLAN:    In order of problems listed above:  1. ***   Medication Adjustments/Labs and Tests Ordered: Current medicines are reviewed at length with the patient today.  Concerns regarding medicines are outlined  above. Labs and tests ordered and medication changes are outlined in the patient instructions below:  There are no Patient Instructions on file for this visit.   Signed, Berton BonJanine Alphons Burgert, NP  04/28/2017 6:59 PM    [facility]

## 2017-04-29 ENCOUNTER — Ambulatory Visit: Payer: Self-pay | Admitting: Cardiology

## 2017-05-14 NOTE — Progress Notes (Deleted)
Cardiology Office Note:    Date:  05/14/2017   ID:  Kevin Barton, DOB Jan 14, 1961, MRN [id number]  PCP:  Patient, No Pcp Per  Cardiologist:  Tonny BollmanMichael Cooper, MD  Referring MD: No ref. provider found   No chief complaint on file. ***  History of Present Illness:    Kevin Barton is a 57 y.o. male with a past medical history significant for CAD with CTO of RCA 06/2016, tobacco use, hypertension, arthritis and chronic knee and back pain.   Kevin Barton is here today for 6337-month follow-up with*** He was last seen by Dr. Excell Seltzerooper in 09/2016 at which time he was noted to continue to smoke and was drinking 40 ounces of Miller beer daily.  He was not having any anginal type symptoms.  _______________________________  CAD: Total obstruction of RCA by cath in 06/2016 with collaterals.  On medical management with aspirin 81 mg daily, ACE inhibitor, and statin.  Not on beta-blocker due to history of bradycardia. Hypertension: Lisinopril/hydrochlorothiazide Hyperlipidemia: Statin was changed from atorvastatin to simvastatin 40 mg daily due to medication cost in 09/2016.  Will recheck lipid panel today.  Goal LDL less than 70.  Lifestyle modification counseling Tobacco abuse  Past Medical History:  Diagnosis Date  . Arthritis   . CAD (coronary artery disease)    cath 06/2016 occlusion of RCA with collateralization from the left coronary artery. Mild nonobstructive plaque in the left main, LAD, and LCx. LVEF 50%. TX Rx.   . Chronic back pain   . Chronic knee pain   . HTN (hypertension)   . Tobacco abuse     Past Surgical History:  Procedure Laterality Date  . LEFT HEART CATH AND CORONARY ANGIOGRAPHY N/A 07/25/2016   Procedure: Left Heart Cath and Coronary Angiography;  Surgeon: Tonny Bollmanooper, Michael, MD;  [facility];  Service: Cardiovascular;  Laterality: N/A;    Current Medications: No outpatient medications have been marked as taking for the 05/16/17 encounter (Appointment) with  Berton BonHammond, Wyeth Hoffer, NP.     Allergies:   Patient has no known allergies.   Social History   Socioeconomic History  . Marital status: Single    Spouse name: Not on file  . Number of children: Not on file  . Years of education: Not on file  . Highest education level: Not on file  Social Needs  . Financial resource strain: Not on file  . Food insecurity - worry: Not on file  . Food insecurity - inability: Not on file  . Transportation needs - medical: Not on file  . Transportation needs - non-medical: Not on file  Occupational History  . Not on file  Tobacco Use  . Smoking status: Current Every Day Smoker    Packs/day: 0.50    Types: Cigarettes  . Smokeless tobacco: Never Used  Substance and Sexual Activity  . Alcohol use: Yes    Comment: a beer a day  . Drug use: No  . Sexual activity: Not on file  Other Topics Concern  . Not on file  Social History Narrative  . Not on file     Family History: The patient's ***family history includes Diabetes in his brother. ROS:   Please see the history of present illness.    *** All other systems reviewed and are negative.  EKGs/Labs/Other Studies Reviewed:    The following studies were reviewed today:  Left Heart Cath and Coronary Angiography  07/25/2016  Conclusion   1. Severe single vessel CAD with total  occlusion of the RCA, collateralized from the left coronary artery 2. Mild nonobstructive plaque in the left main, LAD, and LCx 3. Mild segmental LV dysfunction, LVEF 50%  Recommend: medical therapy     EKG:  EKG is *** ordered today.  The ekg ordered today demonstrates ***  Recent Labs: 07/25/2016: BUN 19; Creatinine, Ser 1.24; Hemoglobin 14.0; Platelets 251; Potassium 3.9; Sodium 135 10/25/2016: ALT 26   Recent Lipid Panel    Component Value Date/Time   CHOL 196 10/25/2016 1522   TRIG 206 (H) 10/25/2016 1522   HDL 64 10/25/2016 1522   CHOLHDL 3.1 10/25/2016 1522   LDLCALC 91 10/25/2016 1522    Physical Exam:      VS:  There were no vitals taken for this visit.    Wt Readings from Last 3 Encounters:  10/25/16 201 lb 6.4 oz (91.4 kg)  08/09/16 210 lb 12.8 oz (95.6 kg)  07/25/16 207 lb (93.9 kg)     Physical Exam***   ASSESSMENT:    No diagnosis found. PLAN:    In order of problems listed above:  1. ***   Medication Adjustments/Labs and Tests Ordered: Current medicines are reviewed at length with the patient today.  Concerns regarding medicines are outlined above. Labs and tests ordered and medication changes are outlined in the patient instructions below:  There are no Patient Instructions on file for this visit.   Signed, Berton Bon, NP  05/14/2017 3:05 PM    [facility]

## 2017-05-16 ENCOUNTER — Ambulatory Visit: Payer: Self-pay | Admitting: Cardiology

## 2017-05-23 ENCOUNTER — Ambulatory Visit (INDEPENDENT_AMBULATORY_CARE_PROVIDER_SITE_OTHER): Payer: Self-pay | Admitting: Cardiology

## 2017-05-23 ENCOUNTER — Encounter: Payer: Self-pay | Admitting: Cardiology

## 2017-05-23 ENCOUNTER — Encounter (INDEPENDENT_AMBULATORY_CARE_PROVIDER_SITE_OTHER): Payer: Self-pay

## 2017-05-23 VITALS — BP 130/80 | HR 66 | Ht 64.0 in | Wt 195.0 lb

## 2017-05-23 DIAGNOSIS — I2510 Atherosclerotic heart disease of native coronary artery without angina pectoris: Secondary | ICD-10-CM

## 2017-05-23 DIAGNOSIS — Z720 Tobacco use: Secondary | ICD-10-CM

## 2017-05-23 DIAGNOSIS — E785 Hyperlipidemia, unspecified: Secondary | ICD-10-CM

## 2017-05-23 DIAGNOSIS — I10 Essential (primary) hypertension: Secondary | ICD-10-CM

## 2017-05-23 LAB — HEPATIC FUNCTION PANEL
ALBUMIN: 4.2 g/dL (ref 3.5–5.5)
ALT: 20 IU/L (ref 0–44)
AST: 28 IU/L (ref 0–40)
Alkaline Phosphatase: 74 IU/L (ref 39–117)
Bilirubin Total: 0.5 mg/dL (ref 0.0–1.2)
Bilirubin, Direct: 0.13 mg/dL (ref 0.00–0.40)
TOTAL PROTEIN: 7.6 g/dL (ref 6.0–8.5)

## 2017-05-23 LAB — BASIC METABOLIC PANEL
BUN / CREAT RATIO: 9 (ref 9–20)
BUN: 10 mg/dL (ref 6–24)
CHLORIDE: 105 mmol/L (ref 96–106)
CO2: 22 mmol/L (ref 20–29)
CREATININE: 1.1 mg/dL (ref 0.76–1.27)
Calcium: 9.7 mg/dL (ref 8.7–10.2)
GFR calc Af Amer: 88 mL/min/{1.73_m2} (ref >59)
GFR calc non Af Amer: 76 mL/min/{1.73_m2} (ref >59)
GLUCOSE: 102 mg/dL — AB (ref 65–99)
POTASSIUM: 4.6 mmol/L (ref 3.5–5.2)
SODIUM: 141 mmol/L (ref 134–144)

## 2017-05-23 LAB — LIPID PANEL
CHOL/HDL RATIO: 3 ratio (ref 0.0–5.0)
Cholesterol, Total: 249 mg/dL — ABNORMAL HIGH (ref 100–199)
HDL: 82 mg/dL (ref >39)
LDL Calculated: 154 mg/dL — ABNORMAL HIGH (ref 0–99)
Triglycerides: 65 mg/dL (ref 0–149)
VLDL CHOLESTEROL CAL: 13 mg/dL (ref 5–40)

## 2017-05-23 MED ORDER — ISOSORBIDE MONONITRATE ER 30 MG PO TB24: 30.0000 mg | ORAL_TABLET | Freq: Every day | ORAL | 3 refills | Status: DC

## 2017-05-23 MED ORDER — LISINOPRIL-HYDROCHLOROTHIAZIDE 20-25 MG PO TABS: 1.0000 | ORAL_TABLET | Freq: Every day | ORAL | 3 refills | Status: DC

## 2017-05-23 MED ORDER — SIMVASTATIN 40 MG PO TABS: 40.0000 mg | ORAL_TABLET | Freq: Every day | ORAL | 3 refills | Status: DC

## 2017-05-23 MED ORDER — NICOTINE 14 MG/24HR TD PT24: 14.0000 mg | MEDICATED_PATCH | Freq: Every day | TRANSDERMAL | 0 refills | Status: DC

## 2017-05-23 NOTE — Patient Instructions (Addendum)
Medication Instructions:  Your physician has recommended you make the following change in your medication:   START Imdur (Isosorbide mononitrate) 30 mg daily START Nicoderm patch 14mg  - one patch daily x 28 days **Call us in 3 weeks to let us know if you are wearing these and we will order next dose (7 mg patches)   Labwork: TODAY - cholesterol   Testing/Procedures: None Ordered   Follow-Up: Your physician recommends that you return for a follow-up appointment in September with Dr. Excell Seltzerooper   **Call us or go to the ED if you have chest pain that is not resolved with NTG and rest or if pain is occurring frequently  Your physician discussed the hazards of tobacco use. Tobacco use cessation is recommended and techniques and options to help you quit were discussed.    DASH Eating Plan DASH stands for "Dietary Approaches to Stop Hypertension." The DASH eating plan is a healthy eating plan that has been shown to reduce high blood pressure (hypertension). It may also reduce your risk for type 2 diabetes, heart disease, and stroke. The DASH eating plan may also help with weight loss. What are tips for following this plan? General guidelines  Avoid eating more than 2,300 mg (milligrams) of salt (sodium) a day. If you have hypertension, you may need to reduce your sodium intake to 1,500 mg a day.  Limit alcohol intake to no more than 1 drink a day for nonpregnant women and 2 drinks a day for men. One drink equals 12 oz of beer, 5 oz of wine, or 1 oz of hard liquor.  Work with your health care provider to maintain a healthy body weight or to lose weight. Ask what an ideal weight is for you.  Get at least 30 minutes of exercise that causes your heart to beat faster (aerobic exercise) most days of the week. Activities may include walking, swimming, or biking.  Work with your health care provider or diet and nutrition specialist (dietitian) to adjust your eating plan to your individual  calorie needs. Reading food labels  Check food labels for the amount of sodium per serving. Choose foods with less than 5 percent of the Daily Value of sodium. Generally, foods with less than 300 mg of sodium per serving fit into this eating plan.  To find whole grains, look for the word "whole" as the first word in the ingredient list. Shopping  Buy products labeled as "low-sodium" or "no salt added."  Buy fresh foods. Avoid canned foods and premade or frozen meals. Cooking  Avoid adding salt when cooking. Use salt-free seasonings or herbs instead of table salt or sea salt. Check with your health care provider or pharmacist before using salt substitutes.  Do not fry foods. Cook foods using healthy methods such as baking, boiling, grilling, and broiling instead.  Cook with heart-healthy oils, such as olive, canola, soybean, or sunflower oil. Meal planning   Eat a balanced diet that includes: ? 5 or more servings of fruits and vegetables each day. At each meal, try to fill half of your plate with fruits and vegetables. ? Up to 6-8 servings of whole grains each day. ? Less than 6 oz of lean meat, poultry, or fish each day. A 3-oz serving of meat is about the same size as a deck of cards. One egg equals 1 oz. ? 2 servings of low-fat dairy each day. ? A serving of nuts, seeds, or beans 5 times each week. ? Heart-healthy fats. Healthy  fats called Omega-3 fatty acids are found in foods such as flaxseeds and coldwater fish, like sardines, salmon, and mackerel.  Limit how much you eat of the following: ? Canned or prepackaged foods. ? Food that is high in trans fat, such as fried foods. ? Food that is high in saturated fat, such as fatty meat. ? Sweets, desserts, sugary drinks, and other foods with added sugar. ? Full-fat dairy products.  Do not salt foods before eating.  Try to eat at least 2 vegetarian meals each week.  Eat more home-cooked food and less restaurant, buffet, and fast  food.  When eating at a restaurant, ask that your food be prepared with less salt or no salt, if possible. What foods are recommended? The items listed may not be a complete list. Talk with your dietitian about what dietary choices are best for you. Grains Whole-grain or whole-wheat bread. Whole-grain or whole-wheat pasta. Brown rice. [place]. Bulgur. Whole-grain and low-sodium cereals. Pita bread. Low-fat, low-sodium crackers. Whole-wheat flour tortillas. Vegetables Fresh or frozen vegetables (raw, steamed, roasted, or grilled). Low-sodium or reduced-sodium tomato and vegetable juice. Low-sodium or reduced-sodium tomato sauce and tomato paste. Low-sodium or reduced-sodium canned vegetables. Fruits All fresh, dried, or frozen fruit. Canned fruit in natural juice (without added sugar). Meat and other protein foods Skinless chicken or [place]. Ground chicken or [place]. Pork with fat trimmed off. Fish and seafood. Egg whites. Dried beans, peas, or lentils. Unsalted nuts, nut butters, and seeds. Unsalted canned beans. Lean cuts of beef with fat trimmed off. Low-sodium, lean deli meat. Dairy Low-fat (1%) or fat-free (skim) milk. Fat-free, low-fat, or reduced-fat cheeses. Nonfat, low-sodium ricotta or cottage cheese. Low-fat or nonfat yogurt. Low-fat, low-sodium cheese. Fats and oils Soft margarine without trans fats. Vegetable oil. Low-fat, reduced-fat, or light mayonnaise and salad dressings (reduced-sodium). Canola, safflower, olive, soybean, and sunflower oils. Avocado. Seasoning and other foods Herbs. Spices. Seasoning mixes without salt. Unsalted popcorn and pretzels. Fat-free sweets. What foods are not recommended? The items listed may not be a complete list. Talk with your dietitian about what dietary choices are best for you. Grains Baked goods made with fat, such as croissants, muffins, or some breads. Dry pasta or rice meal packs. Vegetables Creamed or fried vegetables. Vegetables  in a cheese sauce. Regular canned vegetables (not low-sodium or reduced-sodium). Regular canned tomato sauce and paste (not low-sodium or reduced-sodium). Regular tomato and vegetable juice (not low-sodium or reduced-sodium). Angie Fava. Olives. Fruits Canned fruit in a light or heavy syrup. Fried fruit. Fruit in cream or butter sauce. Meat and other protein foods Fatty cuts of meat. Ribs. Fried meat. Berniece Salines. Sausage. Bologna and other processed lunch meats. Salami. Fatback. Hotdogs. Bratwurst. Salted nuts and seeds. Canned beans with added salt. Canned or smoked fish. Whole eggs or egg yolks. Chicken or [place] with skin. Dairy Whole or 2% milk, cream, and half-and-half. Whole or full-fat cream cheese. Whole-fat or sweetened yogurt. Full-fat cheese. Nondairy creamers. Whipped toppings. Processed cheese and cheese spreads. Fats and oils Butter. Stick margarine. Lard. Shortening. Ghee. Bacon fat. Tropical oils, such as coconut, palm kernel, or palm oil. Seasoning and other foods Salted popcorn and pretzels. Onion salt, garlic salt, seasoned salt, table salt, and sea salt. Worcestershire sauce. Tartar sauce. Barbecue sauce. Teriyaki sauce. Soy sauce, including reduced-sodium. Steak sauce. Canned and packaged gravies. Fish sauce. Oyster sauce. Cocktail sauce. Horseradish that you find on the shelf. Ketchup. Mustard. Meat flavorings and tenderizers. Bouillon cubes. Hot sauce and Tabasco sauce. Premade or packaged  marinades. Premade or packaged taco seasonings. Relishes. Regular salad dressings. Where to find more information:  National Heart, Lung, and Blood Institute: PopSteam.is  American Heart Association: www.heart.org Summary  The DASH eating plan is a healthy eating plan that has been shown to reduce high blood pressure (hypertension). It may also reduce your risk for type 2 diabetes, heart disease, and stroke.  With the DASH eating plan, you should limit salt (sodium) intake to 2,300 mg a  day. If you have hypertension, you may need to reduce your sodium intake to 1,500 mg a day.  When on the DASH eating plan, aim to eat more fresh fruits and vegetables, whole grains, lean proteins, low-fat dairy, and heart-healthy fats.  Work with your health care provider or diet and nutrition specialist (dietitian) to adjust your eating plan to your individual calorie needs. This information is not intended to replace advice given to you by your health care provider. Make sure you discuss any questions you have with your health care provider. Document Released: 02/01/2011 Document Revised: 02/06/2016 Document Reviewed: 02/06/2016 Elsevier Interactive Patient Education  [employer].  If you need a refill on your cardiac medications before your next appointment, please call your pharmacy.   Thank you for choosing [facility]! Eligha Bridegroom, RN [phone]

## 2017-05-23 NOTE — Progress Notes (Signed)
Cardiology Office Note:    Date:  05/23/2017   ID:  Kevin Barton, DOB January 16, 1961, MRN [id number]  PCP:  Patient, No Pcp Per  Cardiologist:  Tonny Bollman, MD  Referring MD: No ref. provider found   Chief Complaint  Patient presents with  . Follow-up    CAD    History of Present Illness:    Kevin Barton is a 57 y.o. male with a past medical history significant for CAD with CTO of RCA 06/2016, tobacco use, hypertension, arthritis and chronic knee and back pain.   Kevin Barton is here today for 65-month follow-up alone. He was last seen by Dr. Excell Seltzer in 09/2016 at which time he was noted to continue to smoke and was drinking 40 ounces of Miller beer daily.  He was not having any anginal type symptoms.  Today he says he is doing well.  He does note that he had some mild mid chest pressure that occurred twice over the last 3 months both times while he was smoking a cigarette while he was walking.  He took nitroglycerin with immediate relief.  He says that he is no longer smoking while he walks.  He walks for transportation and uses the bus.  He says that he walks for 20-30 minutes/day without any recent chest discomfort or dyspnea.  He denies orthopnea, PND, edema, palpitations, syncope/near syncope.  He is currently on disability he states because he has 2 stents although that should not keep him out of work.  He does report that he tries to eat whole grains and limit fats and sodium.  He eats a lot of fruits and vegetables.  He continues to smoke about half pack per day and drinks a 40 ounce beer per day.  He has been out of his medications for the last 2 months.  He reports that he has difficulty getting to the pharmacy due to lack of transportation.  We will refill his medications with a 90-day supply and move his pharmacy to the one closer to him.  The patient also has right knee pain and feels like he will need knee surgery at some point.    Past Medical History:  Diagnosis Date  .  Arthritis   . CAD (coronary artery disease)    cath 06/2016 occlusion of RCA with collateralization from the left coronary artery. Mild nonobstructive plaque in the left main, LAD, and LCx. LVEF 50%. TX Rx.   . Chronic back pain   . Chronic knee pain   . HTN (hypertension)   . Tobacco abuse     Past Surgical History:  Procedure Laterality Date  . LEFT HEART CATH AND CORONARY ANGIOGRAPHY N/A 07/25/2016   Procedure: Left Heart Cath and Coronary Angiography;  Surgeon: Tonny Bollman, MD;  Location: [facility] INVASIVE CV LAB;  Service: Cardiovascular;  Laterality: N/A;    Current Medications: Current Meds  Medication Sig  . aspirin EC 81 MG tablet Take 81 mg by mouth daily.  Marland Kitchen ibuprofen (ADVIL,MOTRIN) 800 MG tablet Take 800 mg by mouth every 8 (eight) hours as needed for moderate pain.  Marland Kitchen lisinopril-hydrochlorothiazide (PRINZIDE,ZESTORETIC) 20-25 MG tablet Take 1 tablet by mouth daily.  . nitroGLYCERIN (NITROSTAT) 0.4 MG SL tablet Place 1 tablet (0.4 mg total) under the tongue every 5 (five) minutes as needed for chest pain.  . simvastatin (ZOCOR) 40 MG tablet Take 1 tablet (40 mg total) by mouth at bedtime.  Marland Kitchen  lisinopril-hydrochlorothiazide (PRINZIDE,ZESTORETIC) 20-25 MG tablet Take 1 tablet by mouth daily.  Marland Kitchen  simvastatin (ZOCOR) 40 MG tablet Take 1 tablet (40 mg total) by mouth at bedtime.     Allergies:   Patient has no known allergies.   Social History   Socioeconomic History  . Marital status: Single    Spouse name: Not on file  . Number of children: Not on file  . Years of education: Not on file  . Highest education level: Not on file  Occupational History  . Not on file  Social Needs  . Financial resource strain: Not on file  . Food insecurity:    Worry: Not on file    Inability: Not on file  . Transportation needs:    Medical: Not on file    Non-medical: Not on file  Tobacco Use  . Smoking status: Current Every Day Smoker    Packs/day: 0.50    Types: Cigarettes  .  Smokeless tobacco: Never Used  Substance and Sexual Activity  . Alcohol use: Yes    Comment: a beer a day  . Drug use: No  . Sexual activity: Not on file  Lifestyle  . Physical activity:    Days per week: Not on file    Minutes per session: Not on file  . Stress: Not on file  Relationships  . Social connections:    Talks on phone: Not on file    Gets together: Not on file    Attends religious service: Not on file    Active member of club or organization: Not on file    Attends meetings of clubs or organizations: Not on file    Relationship status: Not on file  Other Topics Concern  . Not on file  Social History Narrative  . Not on file     Family History: The patient's family history includes Diabetes in his brother. ROS:   Please see the history of present illness.     All other systems reviewed and are negative.  EKGs/Labs/Other Studies Reviewed:    The following studies were reviewed today:  Left Heart Cath and Coronary Angiography  07/25/2016  Conclusion   1. Severe single vessel CAD with total occlusion of the RCA, collateralized from the left coronary artery 2. Mild nonobstructive plaque in the left main, LAD, and LCx 3. Mild segmental LV dysfunction, LVEF 50%  Recommend: medical therapy    EKG:  EKG is  ordered today.  The ekg ordered today demonstrates normal sinus rhythm, 66 bpm, no changes from previous, QTC 413  Recent Labs: 07/25/2016: BUN 19; Creatinine, Ser 1.24; Hemoglobin 14.0; Platelets 251; Potassium 3.9; Sodium 135 10/25/2016: ALT 26   Recent Lipid Panel    Component Value Date/Time   CHOL 196 10/25/2016 1522   TRIG 206 (H) 10/25/2016 1522   HDL 64 10/25/2016 1522   CHOLHDL 3.1 10/25/2016 1522   LDLCALC 91 10/25/2016 1522    Physical Exam:    VS:  BP 130/80   Pulse 66   Ht 5\' 4"  (1.626 m)   Wt 195 lb (88.5 kg)   BMI 33.47 kg/m     Wt Readings from Last 3 Encounters:  05/23/17 195 lb (88.5 kg)  10/25/16 201 lb 6.4 oz (91.4 kg)    08/09/16 210 lb 12.8 oz (95.6 kg)     Physical Exam  Constitutional: He is oriented to person, place, and time. He appears well-developed and well-nourished. No distress.  HENT:  Head: Normocephalic and atraumatic.  exotropia  Neck: Normal range of motion. Neck supple. No JVD present.  Cardiovascular: Normal rate, regular rhythm, normal heart sounds and intact distal pulses. Exam reveals no gallop and no friction rub.  No murmur heard. Pulmonary/Chest: Effort normal and breath sounds normal. No respiratory distress. He has no wheezes. He has no rales.  Abdominal: Bowel sounds are normal.  Musculoskeletal: Normal range of motion. He exhibits tenderness. He exhibits no edema.  Right knee  Neurological: He is alert and oriented to person, place, and time.  Skin: Skin is warm and dry.  Psychiatric: He has a normal mood and affect. His behavior is normal. Thought content normal.    ASSESSMENT:    1. Coronary artery disease involving native coronary artery of native heart without angina pectoris   2. Essential (primary) hypertension   3. Hyperlipidemia LDL goal <70   4. Tobacco abuse    PLAN:    In order of problems listed above:  CAD: Total obstruction of RCA by cath in 06/2016 with collaterals.  On medical management with aspirin 81 mg daily, ACE inhibitor, and statin.  Not on beta-blocker due to history of bradycardia.  The patient had 2 brief episodes of chest pressure while smoking and walking and relieved with nitroglycerin over the last 3 months.  He has stopped smoking while he walks with no further chest discomfort.  We will start Imdur.  We discussed in depth secondary prevention including smoking cessation, heart healthy diet, daily exercise, blood pressure control, lipid management.   Hypertension: Lisinopril/hydrochlorothiazide.  Patient has been out of his medications for about 2 months.  Blood pressure is not terrible considering his lack of medication.  Will refill these  with a 90-day supply at a pharmacy closer to his home due to transportation issues.  Hyperlipidemia: Statin was changed from atorvastatin to simvastatin 40 mg daily due to medication cost in 09/2016.  Will recheck lipid panel today.  Goal LDL less than 70.  Lifestyle modification counseling done.  Tobacco abuse: Patient continues to smoke about half pack per day.  We had a long discussion about the effects of smoking on the vascular system including the heart which already has one occluded blood vessel.  He was appreciative of the information and he plans to work on smoking cessation.  He would like nicotine patches so I will order those.   Medication Adjustments/Labs and Tests Ordered: Current medicines are reviewed at length with the patient today.  Concerns regarding medicines are outlined above. Labs and tests ordered and medication changes are outlined in the patient instructions below:  Patient Instructions  Medication Instructions:  Your physician has recommended you make the following change in your medication:   START Imdur (Isosorbide mononitrate) 30 mg daily START Nicoderm patch 14mg  - one patch daily x 28 days **Call us in 3 weeks to let us know if you are wearing these and we will order next dose (7 mg patches)   Labwork: TODAY - cholesterol   Testing/Procedures: None Ordered   Follow-Up: Your physician recommends that you return for a follow-up appointment in September with Dr. Excell Seltzer   **Call us or go to the ED if you have chest pain that is not resolved with NTG and rest or if pain is occurring frequently  Your physician discussed the hazards of tobacco use. Tobacco use cessation is recommended and techniques and options to help you quit were discussed.    DASH Eating Plan DASH stands for "Dietary Approaches to Stop Hypertension." The DASH eating plan is a healthy eating plan that has been shown to  reduce high blood pressure (hypertension). It may also reduce your  risk for type 2 diabetes, heart disease, and stroke. The DASH eating plan may also help with weight loss. What are tips for following this plan? General guidelines  Avoid eating more than 2,300 mg (milligrams) of salt (sodium) a day. If you have hypertension, you may need to reduce your sodium intake to 1,500 mg a day.  Limit alcohol intake to no more than 1 drink a day for nonpregnant women and 2 drinks a day for men. One drink equals 12 oz of beer, 5 oz of wine, or 1 oz of hard liquor.  Work with your health care provider to maintain a healthy body weight or to lose weight. Ask what an ideal weight is for you.  Get at least 30 minutes of exercise that causes your heart to beat faster (aerobic exercise) most days of the week. Activities may include walking, swimming, or biking.  Work with your health care provider or diet and nutrition specialist (dietitian) to adjust your eating plan to your individual calorie needs. Reading food labels  Check food labels for the amount of sodium per serving. Choose foods with less than 5 percent of the Daily Value of sodium. Generally, foods with less than 300 mg of sodium per serving fit into this eating plan.  To find whole grains, look for the word "whole" as the first word in the ingredient list. Shopping  Buy products labeled as "low-sodium" or "no salt added."  Buy fresh foods. Avoid canned foods and premade or frozen meals. Cooking  Avoid adding salt when cooking. Use salt-free seasonings or herbs instead of table salt or sea salt. Check with your health care provider or pharmacist before using salt substitutes.  Do not fry foods. Cook foods using healthy methods such as baking, boiling, grilling, and broiling instead.  Cook with heart-healthy oils, such as olive, canola, soybean, or sunflower oil. Meal planning   Eat a balanced diet that includes: ? 5 or more servings of fruits and vegetables each day. At each meal, try to fill half of  your plate with fruits and vegetables. ? Up to 6-8 servings of whole grains each day. ? Less than 6 oz of lean meat, poultry, or fish each day. A 3-oz serving of meat is about the same size as a deck of cards. One egg equals 1 oz. ? 2 servings of low-fat dairy each day. ? A serving of nuts, seeds, or beans 5 times each week. ? Heart-healthy fats. Healthy fats called Omega-3 fatty acids are found in foods such as flaxseeds and coldwater fish, like sardines, salmon, and mackerel.  Limit how much you eat of the following: ? Canned or prepackaged foods. ? Food that is high in trans fat, such as fried foods. ? Food that is high in saturated fat, such as fatty meat. ? Sweets, desserts, sugary drinks, and other foods with added sugar. ? Full-fat dairy products.  Do not salt foods before eating.  Try to eat at least 2 vegetarian meals each week.  Eat more home-cooked food and less restaurant, buffet, and fast food.  When eating at a restaurant, ask that your food be prepared with less salt or no salt, if possible. What foods are recommended? The items listed may not be a complete list. Talk with your dietitian about what dietary choices are best for you. Grains Whole-grain or whole-wheat bread. Whole-grain or whole-wheat pasta. Brown rice. Orpah Cobb. Bulgur. Whole-grain and low-sodium cereals.  Pita bread. Low-fat, low-sodium crackers. Whole-wheat flour tortillas. Vegetables Fresh or frozen vegetables (raw, steamed, roasted, or grilled). Low-sodium or reduced-sodium tomato and vegetable juice. Low-sodium or reduced-sodium tomato sauce and tomato paste. Low-sodium or reduced-sodium canned vegetables. Fruits All fresh, dried, or frozen fruit. Canned fruit in natural juice (without added sugar). Meat and other protein foods Skinless chicken or [place]. Ground chicken or [place]. Pork with fat trimmed off. Fish and seafood. Egg whites. Dried beans, peas, or lentils. Unsalted nuts, nut butters,  and seeds. Unsalted canned beans. Lean cuts of beef with fat trimmed off. Low-sodium, lean deli meat. Dairy Low-fat (1%) or fat-free (skim) milk. Fat-free, low-fat, or reduced-fat cheeses. Nonfat, low-sodium ricotta or cottage cheese. Low-fat or nonfat yogurt. Low-fat, low-sodium cheese. Fats and oils Soft margarine without trans fats. Vegetable oil. Low-fat, reduced-fat, or light mayonnaise and salad dressings (reduced-sodium). Canola, safflower, olive, soybean, and sunflower oils. Avocado. Seasoning and other foods Herbs. Spices. Seasoning mixes without salt. Unsalted popcorn and pretzels. Fat-free sweets. What foods are not recommended? The items listed may not be a complete list. Talk with your dietitian about what dietary choices are best for you. Grains Baked goods made with fat, such as croissants, muffins, or some breads. Dry pasta or rice meal packs. Vegetables Creamed or fried vegetables. Vegetables in a cheese sauce. Regular canned vegetables (not low-sodium or reduced-sodium). Regular canned tomato sauce and paste (not low-sodium or reduced-sodium). Regular tomato and vegetable juice (not low-sodium or reduced-sodium). Rosita Fire. Olives. Fruits Canned fruit in a light or heavy syrup. Fried fruit. Fruit in cream or butter sauce. Meat and other protein foods Fatty cuts of meat. Ribs. Fried meat. Tomasa Blase. Sausage. Bologna and other processed lunch meats. Salami. Fatback. Hotdogs. Bratwurst. Salted nuts and seeds. Canned beans with added salt. Canned or smoked fish. Whole eggs or egg yolks. Chicken or [place] with skin. Dairy Whole or 2% milk, cream, and half-and-half. Whole or full-fat cream cheese. Whole-fat or sweetened yogurt. Full-fat cheese. Nondairy creamers. Whipped toppings. Processed cheese and cheese spreads. Fats and oils Butter. Stick margarine. Lard. Shortening. Ghee. Bacon fat. Tropical oils, such as coconut, palm kernel, or palm oil. Seasoning and other foods Salted popcorn  and pretzels. Onion salt, garlic salt, seasoned salt, table salt, and sea salt. Worcestershire sauce. Tartar sauce. Barbecue sauce. Teriyaki sauce. Soy sauce, including reduced-sodium. Steak sauce. Canned and packaged gravies. Fish sauce. Oyster sauce. Cocktail sauce. Horseradish that you find on the shelf. Ketchup. Mustard. Meat flavorings and tenderizers. Bouillon cubes. Hot sauce and Tabasco sauce. Premade or packaged marinades. Premade or packaged taco seasonings. Relishes. Regular salad dressings. Where to find more information:  National Heart, Lung, and Blood Institute: PopSteam.is  American Heart Association: www.heart.org Summary  The DASH eating plan is a healthy eating plan that has been shown to reduce high blood pressure (hypertension). It may also reduce your risk for type 2 diabetes, heart disease, and stroke.  With the DASH eating plan, you should limit salt (sodium) intake to 2,300 mg a day. If you have hypertension, you may need to reduce your sodium intake to 1,500 mg a day.  When on the DASH eating plan, aim to eat more fresh fruits and vegetables, whole grains, lean proteins, low-fat dairy, and heart-healthy fats.  Work with your health care provider or diet and nutrition specialist (dietitian) to adjust your eating plan to your individual calorie needs. This information is not intended to replace advice given to you by your health care provider. Make sure you discuss any questions  you have with your health care provider. Document Released: 02/01/2011 Document Revised: 02/06/2016 Document Reviewed: 02/06/2016 Elsevier Interactive Patient Education  [employer].  If you need a refill on your cardiac medications before your next appointment, please call your pharmacy.   Thank you for choosing [facility]! Eligha Bridegroom, RN [phone]       Signed, Berton Bon, NP  05/23/2017 12:07 PM    [facility]

## 2017-10-05 ENCOUNTER — Inpatient Hospital Stay (HOSPITAL_COMMUNITY): Payer: Medicaid Other

## 2017-10-05 ENCOUNTER — Inpatient Hospital Stay (HOSPITAL_COMMUNITY)
Admission: EM | Admit: 2017-10-05 | Discharge: 2017-10-08 | DRG: 024 | Disposition: A | Discharge status: Home / Self Care | Payer: Medicaid Other | Attending: Neurology | Admitting: Neurology

## 2017-10-05 ENCOUNTER — Emergency Department (HOSPITAL_COMMUNITY): Payer: Medicaid Other

## 2017-10-05 ENCOUNTER — Encounter (HOSPITAL_COMMUNITY): Payer: Self-pay | Admitting: *Deleted

## 2017-10-05 ENCOUNTER — Emergency Department (HOSPITAL_COMMUNITY): Payer: Medicaid Other | Admitting: Anesthesiology

## 2017-10-05 ENCOUNTER — Encounter (HOSPITAL_COMMUNITY)
Admission: EM | Disposition: A | Discharge status: Home / Self Care | Payer: Self-pay | Source: Home / Self Care | Attending: Neurology

## 2017-10-05 DIAGNOSIS — E785 Hyperlipidemia, unspecified: Secondary | ICD-10-CM | POA: Diagnosis present

## 2017-10-05 DIAGNOSIS — F1010 Alcohol abuse, uncomplicated: Secondary | ICD-10-CM | POA: Diagnosis present

## 2017-10-05 DIAGNOSIS — I63239 Cerebral infarction due to unspecified occlusion or stenosis of unspecified carotid arteries: Secondary | ICD-10-CM | POA: Diagnosis present

## 2017-10-05 DIAGNOSIS — I959 Hypotension, unspecified: Secondary | ICD-10-CM | POA: Diagnosis not present

## 2017-10-05 DIAGNOSIS — R270 Ataxia, unspecified: Secondary | ICD-10-CM | POA: Diagnosis present

## 2017-10-05 DIAGNOSIS — H518 Other specified disorders of binocular movement: Secondary | ICD-10-CM | POA: Diagnosis present

## 2017-10-05 DIAGNOSIS — F1090 Alcohol use, unspecified, uncomplicated: Secondary | ICD-10-CM | POA: Diagnosis present

## 2017-10-05 DIAGNOSIS — I63131 Cerebral infarction due to embolism of right carotid artery: Secondary | ICD-10-CM | POA: Diagnosis not present

## 2017-10-05 DIAGNOSIS — G8104 Flaccid hemiplegia affecting left nondominant side: Secondary | ICD-10-CM | POA: Diagnosis present

## 2017-10-05 DIAGNOSIS — Z789 Other specified health status: Secondary | ICD-10-CM | POA: Diagnosis not present

## 2017-10-05 DIAGNOSIS — I63231 Cerebral infarction due to unspecified occlusion or stenosis of right carotid arteries: Secondary | ICD-10-CM | POA: Diagnosis not present

## 2017-10-05 DIAGNOSIS — Z7982 Long term (current) use of aspirin: Secondary | ICD-10-CM

## 2017-10-05 DIAGNOSIS — F17210 Nicotine dependence, cigarettes, uncomplicated: Secondary | ICD-10-CM | POA: Diagnosis present

## 2017-10-05 DIAGNOSIS — E669 Obesity, unspecified: Secondary | ICD-10-CM | POA: Diagnosis present

## 2017-10-05 DIAGNOSIS — F17200 Nicotine dependence, unspecified, uncomplicated: Secondary | ICD-10-CM

## 2017-10-05 DIAGNOSIS — I639 Cerebral infarction, unspecified: Secondary | ICD-10-CM

## 2017-10-05 DIAGNOSIS — R29718 NIHSS score 18: Secondary | ICD-10-CM | POA: Diagnosis present

## 2017-10-05 DIAGNOSIS — R471 Dysarthria and anarthria: Secondary | ICD-10-CM | POA: Diagnosis present

## 2017-10-05 DIAGNOSIS — I6601 Occlusion and stenosis of right middle cerebral artery: Secondary | ICD-10-CM | POA: Diagnosis not present

## 2017-10-05 DIAGNOSIS — Z720 Tobacco use: Secondary | ICD-10-CM | POA: Diagnosis not present

## 2017-10-05 DIAGNOSIS — I2583 Coronary atherosclerosis due to lipid rich plaque: Secondary | ICD-10-CM | POA: Diagnosis not present

## 2017-10-05 DIAGNOSIS — N179 Acute kidney failure, unspecified: Secondary | ICD-10-CM | POA: Diagnosis present

## 2017-10-05 DIAGNOSIS — I119 Hypertensive heart disease without heart failure: Secondary | ICD-10-CM | POA: Diagnosis present

## 2017-10-05 DIAGNOSIS — F1490 Cocaine use, unspecified, uncomplicated: Secondary | ICD-10-CM | POA: Diagnosis present

## 2017-10-05 DIAGNOSIS — I252 Old myocardial infarction: Secondary | ICD-10-CM | POA: Diagnosis not present

## 2017-10-05 DIAGNOSIS — Z8673 Personal history of transient ischemic attack (TIA), and cerebral infarction without residual deficits: Secondary | ICD-10-CM

## 2017-10-05 DIAGNOSIS — I5030 Unspecified diastolic (congestive) heart failure: Secondary | ICD-10-CM | POA: Diagnosis not present

## 2017-10-05 DIAGNOSIS — I10 Essential (primary) hypertension: Secondary | ICD-10-CM

## 2017-10-05 DIAGNOSIS — Z6832 Body mass index (BMI) 32.0-32.9, adult: Secondary | ICD-10-CM | POA: Diagnosis not present

## 2017-10-05 DIAGNOSIS — Z955 Presence of coronary angioplasty implant and graft: Secondary | ICD-10-CM

## 2017-10-05 DIAGNOSIS — R29810 Facial weakness: Secondary | ICD-10-CM | POA: Diagnosis present

## 2017-10-05 DIAGNOSIS — R001 Bradycardia, unspecified: Secondary | ICD-10-CM | POA: Diagnosis not present

## 2017-10-05 DIAGNOSIS — R414 Neurologic neglect syndrome: Secondary | ICD-10-CM | POA: Diagnosis present

## 2017-10-05 DIAGNOSIS — I63511 Cerebral infarction due to unspecified occlusion or stenosis of right middle cerebral artery: Principal | ICD-10-CM | POA: Diagnosis present

## 2017-10-05 DIAGNOSIS — D62 Acute posthemorrhagic anemia: Secondary | ICD-10-CM | POA: Diagnosis not present

## 2017-10-05 DIAGNOSIS — E66811 Obesity, class 1: Secondary | ICD-10-CM

## 2017-10-05 DIAGNOSIS — I672 Cerebral atherosclerosis: Secondary | ICD-10-CM | POA: Diagnosis present

## 2017-10-05 DIAGNOSIS — Z7289 Other problems related to lifestyle: Secondary | ICD-10-CM | POA: Diagnosis present

## 2017-10-05 DIAGNOSIS — R531 Weakness: Secondary | ICD-10-CM | POA: Diagnosis not present

## 2017-10-05 DIAGNOSIS — I6521 Occlusion and stenosis of right carotid artery: Secondary | ICD-10-CM | POA: Diagnosis not present

## 2017-10-05 DIAGNOSIS — F1410 Cocaine abuse, uncomplicated: Secondary | ICD-10-CM | POA: Diagnosis not present

## 2017-10-05 DIAGNOSIS — R110 Nausea: Secondary | ICD-10-CM | POA: Diagnosis not present

## 2017-10-05 DIAGNOSIS — I2510 Atherosclerotic heart disease of native coronary artery without angina pectoris: Secondary | ICD-10-CM | POA: Diagnosis present

## 2017-10-05 DIAGNOSIS — I6523 Occlusion and stenosis of bilateral carotid arteries: Secondary | ICD-10-CM | POA: Diagnosis not present

## 2017-10-05 DIAGNOSIS — R4781 Slurred speech: Secondary | ICD-10-CM | POA: Diagnosis not present

## 2017-10-05 HISTORY — PX: IR CT HEAD LTD: IMG2386

## 2017-10-05 HISTORY — PX: IR INTRAVSC STENT CERV CAROTID W/O EMB-PROT MOD SED INC ANGIO: IMG2304

## 2017-10-05 HISTORY — PX: IR ANGIO VERTEBRAL SEL SUBCLAVIAN INNOMINATE UNI R MOD SED: IMG5365

## 2017-10-05 HISTORY — DX: Non-ST elevation (NSTEMI) myocardial infarction: I21.4

## 2017-10-05 HISTORY — PX: RADIOLOGY WITH ANESTHESIA: SHX6223

## 2017-10-05 HISTORY — PX: IR ANGIO INTRA EXTRACRAN SEL COM CAROTID INNOMINATE UNI L MOD SED: IMG5358

## 2017-10-05 HISTORY — PX: IR PERCUTANEOUS ART THROMBECTOMY/INFUSION INTRACRANIAL INC DIAG ANGIO: IMG6087

## 2017-10-05 LAB — CBG MONITORING, ED: Glucose-Capillary: 101 mg/dL — ABNORMAL HIGH (ref 70–99)

## 2017-10-05 LAB — I-STAT CHEM 8, ED
BUN: 12 mg/dL (ref 6–20)
CALCIUM ION: 1.16 mmol/L (ref 1.15–1.40)
CHLORIDE: 106 mmol/L (ref 98–111)
Creatinine, Ser: 1.6 mg/dL — ABNORMAL HIGH (ref 0.61–1.24)
GLUCOSE: 98 mg/dL (ref 70–99)
HCT: 40 % (ref 39.0–52.0)
Hemoglobin: 13.6 g/dL (ref 13.0–17.0)
Potassium: 3.8 mmol/L (ref 3.5–5.1)
Sodium: 139 mmol/L (ref 135–145)
TCO2: 23 mmol/L (ref 22–32)

## 2017-10-05 LAB — CBC
HEMATOCRIT: 37.7 % — AB (ref 39.0–52.0)
Hemoglobin: 13 g/dL (ref 13.0–17.0)
MCH: 31 pg (ref 26.0–34.0)
MCHC: 34.5 g/dL (ref 30.0–36.0)
MCV: 89.8 fL (ref 78.0–100.0)
PLATELETS: 220 10*3/uL (ref 150–400)
RBC: 4.2 MIL/uL — ABNORMAL LOW (ref 4.22–5.81)
RDW: 14.4 % (ref 11.5–15.5)
WBC: 6.8 10*3/uL (ref 4.0–10.5)

## 2017-10-05 LAB — COMPREHENSIVE METABOLIC PANEL
ALBUMIN: 3.7 g/dL (ref 3.5–5.0)
ALK PHOS: 64 U/L (ref 38–126)
ALT: 17 U/L (ref 0–44)
ANION GAP: 7 (ref 5–15)
AST: 24 U/L (ref 15–41)
BILIRUBIN TOTAL: 0.6 mg/dL (ref 0.3–1.2)
BUN: 11 mg/dL (ref 6–20)
CALCIUM: 9.2 mg/dL (ref 8.9–10.3)
CO2: 24 mmol/L (ref 22–32)
Chloride: 107 mmol/L (ref 98–111)
Creatinine, Ser: 1.61 mg/dL — ABNORMAL HIGH (ref 0.61–1.24)
GFR calc Af Amer: 54 mL/min — ABNORMAL LOW (ref >60)
GFR calc non Af Amer: 47 mL/min — ABNORMAL LOW (ref >60)
GLUCOSE: 103 mg/dL — AB (ref 70–99)
Potassium: 3.8 mmol/L (ref 3.5–5.1)
Sodium: 138 mmol/L (ref 135–145)
TOTAL PROTEIN: 7.1 g/dL (ref 6.5–8.1)

## 2017-10-05 LAB — DIFFERENTIAL
Abs Immature Granulocytes: 0 10*3/uL (ref 0.0–0.1)
Basophils Absolute: 0 10*3/uL (ref 0.0–0.1)
Basophils Relative: 1 %
EOS PCT: 6 %
Eosinophils Absolute: 0.4 10*3/uL (ref 0.0–0.7)
IMMATURE GRANULOCYTES: 0 %
LYMPHS ABS: 2.7 10*3/uL (ref 0.7–4.0)
Lymphocytes Relative: 39 %
MONOS PCT: 17 %
Monocytes Absolute: 1.2 10*3/uL — ABNORMAL HIGH (ref 0.1–1.0)
Neutro Abs: 2.6 10*3/uL (ref 1.7–7.7)
Neutrophils Relative %: 37 %

## 2017-10-05 LAB — I-STAT TROPONIN, ED: Troponin i, poc: 0 ng/mL (ref 0.00–0.08)

## 2017-10-05 LAB — PROTIME-INR
INR: 1.09
PROTHROMBIN TIME: 14.1 s (ref 11.4–15.2)

## 2017-10-05 LAB — MRSA PCR SCREENING: MRSA by PCR: POSITIVE — AB

## 2017-10-05 LAB — APTT: aPTT: 32 seconds (ref 24–36)

## 2017-10-05 SURGERY — IR WITH ANESTHESIA
Anesthesia: General

## 2017-10-05 MED ORDER — FENTANYL CITRATE (PF) 100 MCG/2ML IJ SOLN
INTRAMUSCULAR | Status: AC
  Filled 2017-10-05: qty 2

## 2017-10-05 MED ORDER — ACETAMINOPHEN 160 MG/5ML PO SOLN: 650.0000 mg | ORAL | Status: DC | PRN

## 2017-10-05 MED ORDER — LIDOCAINE HCL 1 % IJ SOLN
INTRAMUSCULAR | Status: AC
  Filled 2017-10-05: qty 20

## 2017-10-05 MED ORDER — ASPIRIN 81 MG PO CHEW: 81.0000 mg | CHEWABLE_TABLET | Freq: Every day | ORAL | Status: DC

## 2017-10-05 MED ORDER — ACETAMINOPHEN 650 MG RE SUPP: 650.0000 mg | RECTAL | Status: DC | PRN

## 2017-10-05 MED ORDER — GLYCOPYRROLATE 0.2 MG/ML IJ SOLN
INTRAMUSCULAR | Status: DC | PRN
  Administered 2017-10-05 (×2): 0.2 mg via INTRAVENOUS

## 2017-10-05 MED ORDER — IOPAMIDOL (ISOVUE-300) INJECTION 61%
INTRAVENOUS | Status: AC
  Administered 2017-10-05: 50 mL
  Filled 2017-10-05: qty 50

## 2017-10-05 MED ORDER — NICARDIPINE HCL IN NACL 20-0.86 MG/200ML-% IV SOLN
INTRAVENOUS | Status: DC | PRN
  Administered 2017-10-05: 5 mg/h via INTRAVENOUS

## 2017-10-05 MED ORDER — SODIUM CHLORIDE 0.9 % IV SOLN
INTRAVENOUS | Status: DC | PRN
  Administered 2017-10-05: 16:00:00 via INTRAVENOUS

## 2017-10-05 MED ORDER — ACETAMINOPHEN 325 MG PO TABS: 650.0000 mg | ORAL_TABLET | ORAL | Status: DC | PRN

## 2017-10-05 MED ORDER — TICAGRELOR 90 MG PO TABS: 90.0000 mg | ORAL_TABLET | Freq: Two times a day (BID) | ORAL | Status: DC

## 2017-10-05 MED ORDER — MUPIROCIN 2 % EX OINT
1.0000 "application " | TOPICAL_OINTMENT | Freq: Two times a day (BID) | CUTANEOUS | Status: DC
  Administered 2017-10-06 – 2017-10-08 (×6): 1 via NASAL
  Filled 2017-10-05 (×3): qty 22

## 2017-10-05 MED ORDER — IOPAMIDOL (ISOVUE-370) INJECTION 76%
50.0000 mL | Freq: Once | INTRAVENOUS | Status: AC | PRN
  Administered 2017-10-05: 50 mL via INTRAVENOUS

## 2017-10-05 MED ORDER — LIDOCAINE HCL (CARDIAC) PF 100 MG/5ML IV SOSY
PREFILLED_SYRINGE | INTRAVENOUS | Status: DC | PRN
  Administered 2017-10-05: 80 mg via INTRATRACHEAL

## 2017-10-05 MED ORDER — ACETAMINOPHEN 325 MG PO TABS
650.0000 mg | ORAL_TABLET | ORAL | Status: DC | PRN
  Administered 2017-10-06: 650 mg via ORAL
  Filled 2017-10-05: qty 2

## 2017-10-05 MED ORDER — TICAGRELOR 90 MG PO TABS
90.0000 mg | ORAL_TABLET | Freq: Two times a day (BID) | ORAL | Status: DC
  Administered 2017-10-06 – 2017-10-08 (×5): 90 mg via ORAL
  Filled 2017-10-05 (×5): qty 1

## 2017-10-05 MED ORDER — PROPOFOL 10 MG/ML IV BOLUS
INTRAVENOUS | Status: DC | PRN
  Administered 2017-10-05: 30 mg via INTRAVENOUS
  Administered 2017-10-05: 40 mg via INTRAVENOUS
  Administered 2017-10-05: 130 mg via INTRAVENOUS

## 2017-10-05 MED ORDER — VERAPAMIL HCL 2.5 MG/ML IV SOLN
INTRAVENOUS | Status: AC
  Administered 2017-10-05 (×2): 2.5 mg via INTRA_ARTERIAL
  Filled 2017-10-05: qty 2

## 2017-10-05 MED ORDER — CLOPIDOGREL BISULFATE 300 MG PO TABS
ORAL_TABLET | ORAL | Status: AC
  Filled 2017-10-05: qty 1

## 2017-10-05 MED ORDER — SODIUM CHLORIDE 0.9 % IV SOLN
50.0000 mL | Freq: Once | INTRAVENOUS | Status: AC
  Administered 2017-10-05: 50 mL via INTRAVENOUS

## 2017-10-05 MED ORDER — FENTANYL CITRATE (PF) 100 MCG/2ML IJ SOLN
INTRAMUSCULAR | Status: DC | PRN
  Administered 2017-10-05: 50 ug via INTRAVENOUS
  Administered 2017-10-05: 100 ug via INTRAVENOUS
  Administered 2017-10-05: 50 ug via INTRAVENOUS

## 2017-10-05 MED ORDER — LABETALOL HCL 5 MG/ML IV SOLN
INTRAVENOUS | Status: DC | PRN
  Administered 2017-10-05: 20 mg via INTRAVENOUS

## 2017-10-05 MED ORDER — ROCURONIUM BROMIDE 100 MG/10ML IV SOLN
INTRAVENOUS | Status: DC | PRN
  Administered 2017-10-05: 60 mg via INTRAVENOUS
  Administered 2017-10-05 (×3): 10 mg via INTRAVENOUS

## 2017-10-05 MED ORDER — ASPIRIN 81 MG PO CHEW
81.0000 mg | CHEWABLE_TABLET | Freq: Every day | ORAL | Status: DC
  Administered 2017-10-06 – 2017-10-08 (×3): 81 mg via ORAL
  Filled 2017-10-05 (×3): qty 1

## 2017-10-05 MED ORDER — TIROFIBAN HCL IN NACL 5-0.9 MG/100ML-% IV SOLN
INTRAVENOUS | Status: AC
  Filled 2017-10-05: qty 100

## 2017-10-05 MED ORDER — ESMOLOL HCL 100 MG/10ML IV SOLN
INTRAVENOUS | Status: DC | PRN
  Administered 2017-10-05: 50 mg via INTRAVENOUS

## 2017-10-05 MED ORDER — CEFAZOLIN SODIUM-DEXTROSE 2-3 GM-%(50ML) IV SOLR
INTRAVENOUS | Status: DC | PRN
  Administered 2017-10-05: 2 g via INTRAVENOUS

## 2017-10-05 MED ORDER — SENNOSIDES-DOCUSATE SODIUM 8.6-50 MG PO TABS: 1.0000 | ORAL_TABLET | Freq: Every evening | ORAL | Status: DC | PRN

## 2017-10-05 MED ORDER — SODIUM CHLORIDE 0.9 % IV SOLN
INTRAVENOUS | Status: DC
  Administered 2017-10-06: 11:00:00 via INTRAVENOUS

## 2017-10-05 MED ORDER — FAMOTIDINE IN NACL 20-0.9 MG/50ML-% IV SOLN
20.0000 mg | Freq: Two times a day (BID) | INTRAVENOUS | Status: DC
  Administered 2017-10-05 – 2017-10-06 (×3): 20 mg via INTRAVENOUS
  Filled 2017-10-05 (×3): qty 50

## 2017-10-05 MED ORDER — SUGAMMADEX SODIUM 200 MG/2ML IV SOLN
INTRAVENOUS | Status: DC | PRN
  Administered 2017-10-05: 150 mg via INTRAVENOUS

## 2017-10-05 MED ORDER — CEFAZOLIN SODIUM-DEXTROSE 2-4 GM/100ML-% IV SOLN
INTRAVENOUS | Status: AC
  Filled 2017-10-05: qty 100

## 2017-10-05 MED ORDER — SODIUM CHLORIDE 0.9 % IV SOLN: INTRAVENOUS | Status: DC

## 2017-10-05 MED ORDER — ALTEPLASE (STROKE) FULL DOSE INFUSION
0.9000 mg/kg | Freq: Once | INTRAVENOUS | Status: AC
  Administered 2017-10-05: 78.5 mg via INTRAVENOUS
  Filled 2017-10-05: qty 100

## 2017-10-05 MED ORDER — CLEVIDIPINE BUTYRATE 0.5 MG/ML IV EMUL
INTRAVENOUS | Status: AC
  Filled 2017-10-05: qty 50

## 2017-10-05 MED ORDER — ONDANSETRON HCL 4 MG/2ML IJ SOLN
INTRAMUSCULAR | Status: DC | PRN
  Administered 2017-10-05: 4 mg via INTRAVENOUS

## 2017-10-05 MED ORDER — IOPAMIDOL (ISOVUE-300) INJECTION 61%
INTRAVENOUS | Status: AC
  Filled 2017-10-05: qty 50

## 2017-10-05 MED ORDER — LABETALOL HCL 5 MG/ML IV SOLN
10.0000 mg | Freq: Once | INTRAVENOUS | Status: AC
  Administered 2017-10-05: 10 mg via INTRAVENOUS

## 2017-10-05 MED ORDER — TICAGRELOR 90 MG PO TABS
ORAL_TABLET | ORAL | Status: AC
  Administered 2017-10-05: 180 mg
  Filled 2017-10-05: qty 2

## 2017-10-05 MED ORDER — CHLORHEXIDINE GLUCONATE CLOTH 2 % EX PADS: 6.0000 | MEDICATED_PAD | Freq: Every day | CUTANEOUS | Status: DC

## 2017-10-05 MED ORDER — IOPAMIDOL (ISOVUE-300) INJECTION 61%
INTRAVENOUS | Status: AC
Start: 2017-10-05 — End: 2017-10-05
  Administered 2017-10-05: 25 mL
  Filled 2017-10-05: qty 100

## 2017-10-05 MED ORDER — IOHEXOL 300 MG/ML  SOLN
150.0000 mL | Freq: Once | INTRAMUSCULAR | Status: AC | PRN
  Administered 2017-10-05: 80 mL via INTRA_ARTERIAL

## 2017-10-05 MED ORDER — SODIUM CHLORIDE 0.9 % IV SOLN
INTRAVENOUS | Status: DC | PRN
  Administered 2017-10-05: 25 ug/min via INTRAVENOUS

## 2017-10-05 MED ORDER — NITROGLYCERIN 1 MG/10 ML FOR IR/CATH LAB
INTRA_ARTERIAL | Status: AC
  Administered 2017-10-05 (×7): 25 ug via INTRA_ARTERIAL
  Filled 2017-10-05: qty 10

## 2017-10-05 MED ORDER — SUCCINYLCHOLINE CHLORIDE 20 MG/ML IJ SOLN
INTRAMUSCULAR | Status: DC | PRN
  Administered 2017-10-05: 80 mg via INTRAVENOUS

## 2017-10-05 MED ORDER — STROKE: EARLY STAGES OF RECOVERY BOOK
Freq: Once | Status: AC
  Administered 2017-10-08: 12:00:00
  Filled 2017-10-05: qty 1

## 2017-10-05 MED ORDER — EPTIFIBATIDE 20 MG/10ML IV SOLN
INTRAVENOUS | Status: AC
  Administered 2017-10-05: 1.5 mg via INTRA_ARTERIAL
  Administered 2017-10-05: 3 mg via INTRA_ARTERIAL
  Filled 2017-10-05: qty 10

## 2017-10-05 MED ORDER — NICARDIPINE HCL IN NACL 20-0.86 MG/200ML-% IV SOLN: 0.0000 mg/h | INTRAVENOUS | Status: DC

## 2017-10-05 MED ORDER — ASPIRIN 325 MG PO TABS
ORAL_TABLET | ORAL | Status: AC
  Administered 2017-10-05: 325 mg
  Filled 2017-10-05: qty 1

## 2017-10-05 NOTE — ED Provider Notes (Signed)
[facility] EMERGENCY DEPARTMENT Provider Note   CSN: [id number] Arrival date & time: 10/05/17  1450     History   Chief Complaint Chief Complaint  Patient presents with  . Code Stroke    HPI Kevin Barton is a 57 y.o. male.  HPI Patient came in as a code stroke.  Met at the bridge by myself and Dr. Erlinda Hong.  Last normal at 215.  Left-sided facial droop left-sided lower extremity and upper extremity weakness.  Some difficulty speaking.  Left-sided neglect.  No headache.  Previous history of coronary artery disease.  Is supposed be on aspirin but reportedly not taking it.  Had nausea without frank vomiting on EMS. Past Medical History:  Diagnosis Date  . Arthritis   . CAD (coronary artery disease)    cath 06/2016 occlusion of RCA with collateralization from the left coronary artery. Mild nonobstructive plaque in the left main, LAD, and LCx. LVEF 50%. TX Rx.   . Chronic back pain   . Chronic knee pain   . HTN (hypertension)   . Tobacco abuse     Patient Active Problem List   Diagnosis Date Noted  . Cerebral embolism with cerebral infarction 10/05/2017  . CAD (coronary artery disease) 04/28/2017  . Essential (primary) hypertension 04/28/2017  . Tobacco abuse 04/28/2017  . Hyperlipidemia LDL goal <70 04/28/2017  . NSTEMI (non-ST elevated myocardial infarction) [facility])     Past Surgical History:  Procedure Laterality Date  . LEFT HEART CATH AND CORONARY ANGIOGRAPHY N/A 07/25/2016   Procedure: Left Heart Cath and Coronary Angiography;  Surgeon: Sherren Mocha, MD;  Location: [facility] CV LAB;  Service: Cardiovascular;  Laterality: N/A;        Home Medications    Prior to Admission medications   Medication Sig Start Date End Date Taking? Authorizing Provider  aspirin EC 81 MG tablet Take 81 mg by mouth daily.    [provider]  ibuprofen (ADVIL,MOTRIN) 800 MG tablet Take 800 mg by mouth every 8 (eight) hours as needed for moderate pain.     [provider]  isosorbide mononitrate (IMDUR) 30 MG 24 hr tablet Take 1 tablet (30 mg total) by mouth daily. 05/23/17 08/21/17  Daune Perch, NP  lisinopril-hydrochlorothiazide (PRINZIDE,ZESTORETIC) 20-25 MG tablet Take 1 tablet by mouth daily. 05/23/17   Daune Perch, NP  nicotine (NICODERM CQ) 14 mg/24hr patch Place 1 patch (14 mg total) onto the skin daily. 05/23/17   Daune Perch, NP  nitroGLYCERIN (NITROSTAT) 0.4 MG SL tablet Place 1 tablet (0.4 mg total) under the tongue every 5 (five) minutes as needed for chest pain. 10/25/16   Sherren Mocha, MD  simvastatin (ZOCOR) 40 MG tablet Take 1 tablet (40 mg total) by mouth at bedtime. 05/23/17 08/21/17  Daune Perch, NP    Family History Family History  Problem Relation Age of Onset  . Diabetes Brother     Social History Social History   Tobacco Use  . Smoking status: Current Every Day Smoker    Packs/day: 0.50    Types: Cigarettes  . Smokeless tobacco: Never Used  Substance Use Topics  . Alcohol use: Yes    Comment: a beer a day  . Drug use: No     Allergies   Patient has no known allergies.   Review of Systems Review of Systems  Constitutional: Negative for appetite change.  HENT: Negative for congestion.   Respiratory: Negative for shortness of breath.   Cardiovascular: Negative for chest pain.  Gastrointestinal: Positive for nausea.  Genitourinary: Negative for flank pain.  Musculoskeletal: Negative for back pain.  Neurological: Positive for facial asymmetry, weakness and numbness.  Hematological: Negative for adenopathy.  Psychiatric/Behavioral: Negative for confusion.     Physical Exam Updated Vital Signs Wt 87.2 kg   BMI 33.00 kg/m   Physical Exam  Constitutional: He appears well-developed.  HENT:  Head: Atraumatic.  Left-sided facial droop  Neck: Neck supple.  Cardiovascular: Normal rate.  Pulmonary/Chest: He has no wheezes.  Abdominal: There is no tenderness.  Musculoskeletal:  He exhibits no tenderness.  Neurological: He is alert.  Somewhat slurred speech.  Left-sided neglect.  Left upper and lower extremity flaccid.  Left-sided facial droop.  Awake and answers questions.  Complete NIH scoring done by neurology.  Skin: Skin is warm. Capillary refill takes less than 2 seconds.     ED Treatments / Results  Labs (all labs ordered are listed, but only abnormal results are displayed) Labs Reviewed  CBC - Abnormal; Notable for the following components:      Result Value   RBC 4.20 (*)    HCT 37.7 (*)    All other components within normal limits  DIFFERENTIAL - Abnormal; Notable for the following components:   Monocytes Absolute 1.2 (*)    All other components within normal limits  CBG MONITORING, ED - Abnormal; Notable for the following components:   Glucose-Capillary 101 (*)    All other components within normal limits  I-STAT CHEM 8, ED - Abnormal; Notable for the following components:   Creatinine, Ser 1.60 (*)    All other components within normal limits  PROTIME-INR  APTT  COMPREHENSIVE METABOLIC PANEL  I-STAT TROPONIN, ED    EKG None  Radiology Ct Head Code Stroke Wo Contrast  Result Date: 10/05/2017 CLINICAL DATA:  Code stroke.  Focal neuro deficit EXAM: CT HEAD WITHOUT CONTRAST TECHNIQUE: Contiguous axial images were obtained from the base of the skull through the vertex without intravenous contrast. COMPARISON:  11/23/2006 FINDINGS: Brain: No evidence of acute infarction, hemorrhage, hydrocephalus, extra-axial collection or mass lesion/mass effect. Vascular: Hyperdense right M1 segment Skull: No acute finding. Sequela of dental disease seen at the maxilla on the right. Sinuses/Orbits: Gaze to the right, correlating with the vascular findings. Other: These results were called by telephone at the time of interpretation on 10/05/2017 at 3:10 pm to Dr. Erlinda Hong , who was already aware of the findings. CTA is pending as permitted by tPA administration  coordination ASPECTS [facility] Stroke Program Early CT Score) - Ganglionic level infarction (caudate, lentiform nuclei, internal capsule, insula, M1-M3 cortex): 7 - Supraganglionic infarction (M4-M6 cortex): 3 Total score (0-10 with 10 being normal): 10 IMPRESSION: Hyperdense right MCA consistent with thrombosis in this setting. No acute hemorrhage. Aspects is 10. Electronically Signed   By: Monte Fantasia M.D.   On: 10/05/2017 15:13    Procedures Procedures (including critical care time)  Medications Ordered in ED Medications  alteplase (ACTIVASE) 1 mg/mL infusion 78.5 mg (has no administration in time range)    Followed by  0.9 %  sodium chloride infusion (has no administration in time range)  clevidipine (CLEVIPREX) 0.5 MG/ML infusion (has no administration in time range)  iopamidol (ISOVUE-370) 76 % injection 50 mL (has no administration in time range)  labetalol (NORMODYNE,TRANDATE) injection 10 mg (has no administration in time range)     Initial Impression / Assessment and Plan / ED Course  I have reviewed the triage vital signs and the nursing notes.  Pertinent labs & imaging results that were available during my care of the patient were reviewed by me and considered in my medical decision making (see chart for details).     Patient came in as a code stroke.  Last normal at 215.  Initial head CT done reviewed by myself and Dr. Erlinda Hong.  Dense right-sided MCA but no blood.  TPA given.  CTA ordered. Likely will get intervention.  Admit to neurology.  CRITICAL CARE Performed by: Davonna Belling Total critical care time: 30 minutes Critical care time was exclusive of separately billable procedures and treating other patients. Critical care was necessary to treat or prevent imminent or life-threatening deterioration. Critical care was time spent personally by me on the following activities: development of treatment plan with patient and/or surrogate as well as nursing, discussions with  consultants, evaluation of patient's response to treatment, examination of patient, obtaining history from patient or surrogate, ordering and performing treatments and interventions, ordering and review of laboratory studies, ordering and review of radiographic studies, pulse oximetry and re-evaluation of patient's condition.    Final Clinical Impressions(s) / ED Diagnoses   Final diagnoses:  Cerebrovascular accident (CVA), unspecified mechanism [facility])    ED Discharge Orders    None       Davonna Belling, MD 10/05/17 [phone]

## 2017-10-05 NOTE — Transfer of Care (Signed)
Immediate Anesthesia Transfer of Care Note  Patient: Kevin Barton  Procedure(s) Performed: IR WITH ANESTHESIA CODE STROKE (N/A )  Patient Location: PACU  Anesthesia Type:General  Level of Consciousness: awake, alert  and oriented  Airway & Oxygen Therapy: Patient Spontanous Breathing and Patient connected to nasal cannula oxygen  Post-op Assessment: Report given to RN, Post -op Vital signs reviewed and stable and Patient moving all extremities  Post vital signs: Reviewed and stable  Last Vitals:  Vitals Value Taken Time  BP 118/79 10/05/2017  7:35 PM  Temp    Pulse 75 10/05/2017  7:36 PM  Resp 15 10/05/2017  7:36 PM  SpO2 98 % 10/05/2017  7:36 PM  Vitals shown include unvalidated device data.  Last Pain: There were no vitals filed for this visit.       Complications: No apparent anesthesia complications

## 2017-10-05 NOTE — Anesthesia Procedure Notes (Signed)
Procedure Name: Intubation Date/Time: 10/05/2017 3:50 PM Performed by: Edmonia CaprioAuston, Kenyetta Fife M, CRNA Pre-anesthesia Checklist: Emergency Drugs available, Patient identified, Suction available, Timeout performed and Patient being monitored Patient Re-evaluated:Patient Re-evaluated prior to induction Oxygen Delivery Method: Circle system utilized Preoxygenation: Pre-oxygenation with 100% oxygen Induction Type: IV induction, Rapid sequence and Cricoid Pressure applied Laryngoscope Size: Miller and 2 Grade View: Grade II Tube type: Subglottic suction tube Tube size: 7.5 mm Number of attempts: 1 Airway Equipment and Method: Stylet Placement Confirmation: ETT inserted through vocal cords under direct vision,  positive ETCO2 and breath sounds checked- equal and bilateral Secured at: 23 cm Tube secured with: Tape Dental Injury: Teeth and Oropharynx as per pre-operative assessment

## 2017-10-05 NOTE — Procedures (Signed)
S/P bilateral common carotid arteriogram,RT Vert angiogram followed by complete revascularization of RT MCA M1 occlusion with x 1 pass with 5mm x 33 mm embotrap device achieving a TICI 3 reperfusion and revascularization of acutely occluded RT ICA prox with stent assisted angioplasty.

## 2017-10-05 NOTE — Progress Notes (Signed)
Pharmacist Code Stroke Response  Notified to mix tPA at 1502 by Dr. Roda ShuttersXu Delivered tPA to RN at 1506  Issues/delays encountered (if applicable): Blood pressure required treatment  Kevin Barton, Kevin LeachRachel Barton 10/05/17 3:03 PM

## 2017-10-05 NOTE — Consult Note (Addendum)
NEURO HOSPITALIST      Requesting Physician: Dr. Rubin Payor    Chief Complaint:  Right gaze deviation, slurred speech, left side flaccid  History obtained from:  Patient    HPI:                                                                                                                                         Kevin Barton is an 57 y.o. male with PMH significant for HTN, CAD, NSTEMI ( 06/2016) presents to ED with suddent onset of slurred speech, right gaze, left side flaccid, and neglecting left side. Code stroke was initiated by EMS. LVO =4.   Patient stated that he woke up this morning in his usual state of health. Has been sitting outside most of the day helping his brother at cookout on [address]. At 1415 his brother noticed that his face was drooping and he was "different". EMS was called and code stroke activated. Patient does not take daily ASA of 81 mg not prescribed any blood thinners. Patient is a smoker.  ED course:  BG: 104, creatinine 1.60, BP: 140/90 at arrival, then elevated to 190's. 10 mg of labetalol given and TPA bolus started at 1513. Foley inserted prior to TPA bolus starting. Patient transported to IR after CTA.  CT head: Hyperdense right MCA.  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 2. Retrograde flow into the right A1 segment via a patent anterior communicating artery. There is slight opacification into the ICA terminus. 3. Some right MCA branch vessels are reconstituted via pial collaterals. 4. Moderate proximal narrowing in the left M2 branch vessels without focal occlusion. 5. Mild distal PCA branch vessel irregularity without a significant posterior circulation stenosis or occlusion. 6. Bilateral maxillary dental disease. 7. Edema or atelectasis throughout the lung apices.   No prior stroke history noted.    Date last known well: Date:  10/05/2017 Time last known well: Time: 14:15 tPA Given: Yes  Bolus started at 1513 Modified Rankin: Rankin Score=0  NIHSS: 18 1a Level of Conscious:0 1b LOC Questions: 0 1c LOC Commands: 0 2 Best Gaze: 2 3 Visual: 2 4 Facial Palsy: 1 5a Motor Arm - left: 4 5b Motor Arm - Right:0  6a Motor Leg - Left: 4 6b Motor Leg - Right: 0 7 Limb Ataxia: 0 8 Sensory: 2 9 Best Language: 0 10 Dysarthria:1 11 Extinct. and Inattention:2 TOTAL: 18    Past Medical History:  Diagnosis Date  . Arthritis   . CAD (coronary artery disease)    cath 06/2016 occlusion of RCA with collateralization from the left  coronary artery. Mild nonobstructive plaque in the left main, LAD, and LCx. LVEF 50%. TX Rx.   . Chronic back pain   . Chronic knee pain   . HTN (hypertension)   . Tobacco abuse     Past Surgical History:  Procedure Laterality Date  . LEFT HEART CATH AND CORONARY ANGIOGRAPHY N/A 07/25/2016   Procedure: Left Heart Cath and Coronary Angiography;  Surgeon: Tonny Bollman, MD;  Location: [facility] INVASIVE CV LAB;  Service: Cardiovascular;  Laterality: N/A;    Family History  Problem Relation Age of Onset  . Diabetes Brother       Social History:  reports that he has been smoking cigarettes. He has been smoking about 0.50 packs per day. He has never used smokeless tobacco. He reports that he drinks alcohol. He reports that he does not use drugs.  Allergies: No Known Allergies  Medications:                                                                                                                           Current Facility-Administered Medications  Medication Dose Route Frequency Provider Last Rate Last Dose  . alteplase (ACTIVASE) 1 mg/mL infusion 78.5 mg  0.9 mg/kg Intravenous Once Marvel Plan, MD       Followed by  . 0.9 %  sodium chloride infusion  50 mL Intravenous Once Marvel Plan, MD      . clevidipine (CLEVIPREX) 0.5 MG/ML infusion           . iopamidol (ISOVUE-370) 76 % injection  50 mL  50 mL Intravenous Once PRN Marvel Plan, MD      . labetalol (NORMODYNE,TRANDATE) injection 10 mg  10 mg Intravenous Once Marvel Plan, MD       Current Outpatient Medications  Medication Sig Dispense Refill  . aspirin EC 81 MG tablet Take 81 mg by mouth daily.    Marland Kitchen ibuprofen (ADVIL,MOTRIN) 800 MG tablet Take 800 mg by mouth every 8 (eight) hours as needed for moderate pain.    . isosorbide mononitrate (IMDUR) 30 MG 24 hr tablet Take 1 tablet (30 mg total) by mouth daily. 90 tablet 3  . lisinopril-hydrochlorothiazide (PRINZIDE,ZESTORETIC) 20-25 MG tablet Take 1 tablet by mouth daily. 90 tablet 3  . nicotine (NICODERM CQ) 14 mg/24hr patch Place 1 patch (14 mg total) onto the skin daily. 28 patch 0  . nitroGLYCERIN (NITROSTAT) 0.4 MG SL tablet Place 1 tablet (0.4 mg total) under the tongue every 5 (five) minutes as needed for chest pain. 25 tablet 3  . simvastatin (ZOCOR) 40 MG tablet Take 1 tablet (40 mg total) by mouth at bedtime. 90 tablet 3     ROS:  History obtained from the patient  General ROS: negative for - chills, fatigue, fever, night sweats, weight gain or weight loss  Ophthalmic ROS: negative for - blurry vision, double vision, eye pain or loss of vision Respiratory ROS: negative for - cough,  shortness of breath or wheezing Cardiovascular ROS: negative for - chest pain, dyspnea on exertion,  Musculoskeletal ROS: negative for - joint swelling or muscular weakness ( patient is neglecting left side) Neurological ROS: as noted in HPI   General Examination:                                                                                                      Weight 87.2 kg.  HEENT-  Normocephalic, no lesions, without obvious abnormality.  Normal external eye and conjunctiva.  Cardiovascular-  pulses palpable throughout   Lungs- no excessive  working breathing.  Saturations within normal limits on RA Extremities- Warm, dry and intact Musculoskeletal-no joint tenderness, deformity or swelling Skin-warm and dry, no hyperpigmentation, vitiligo, or suspicious lesions  Neurological Examination Mental Status: Alert, oriented, thought content appropriate.  Speech fluent without evidence of aphasia. Mild dysarthria noted. Able to follow commands without difficulty. Cranial Nerves: II: Visual fields grossly normal,  III,IV, VI: ptosis not present, right gaze preference, but able to cross midline, extra-ocular motions intact bilaterally, 3 mm pupils equal, round, reactive to light and accommodation V,VII: smile symmetric, facial light touch sensation normal bilaterally VIII: hearing normal bilaterally IX,X: uvula rises symmetrically XI: bilateral shoulder shrug XII: midline tongue extension Motor: Right : Upper extremity   5/5    Left:     Upper extremity   0/5  Lower extremity   5/5     Lower extremity   0/5 Tone and bulk:normal tone throughout; no atrophy noted Sensory: right side normal; left side neglecting and does not feel anything.  Plantars: Right: downgoing   Left: downgoing Cerebellar: deferred Gait: deferred   Lab Results: Basic Metabolic Panel: Recent Labs  Lab 10/05/17 1458  NA 139  K 3.8  CL 106  GLUCOSE 98  BUN 12  CREATININE 1.60*    CBC: Recent Labs  Lab 10/05/17 1452 10/05/17 1458  WBC 6.8  --   NEUTROABS 2.6  --   HGB 13.0 13.6  HCT 37.7* 40.0  MCV 89.8  --   PLT 220  --     Lipid Panel: No results for input(s): CHOL, TRIG, HDL, CHOLHDL, VLDL, LDLCALC in the last 168 hours.  CBG: Recent Labs  Lab 10/05/17 1453  GLUCAP 101*    Imaging: Ct Head Code Stroke Wo Contrast  Result Date: 10/05/2017 CLINICAL DATA:  Code stroke.  Focal neuro deficit EXAM: CT HEAD WITHOUT CONTRAST TECHNIQUE: Contiguous axial images were obtained from the base of the skull through the vertex without  intravenous contrast. COMPARISON:  11/23/2006 FINDINGS: Brain: No evidence of acute infarction, hemorrhage, hydrocephalus, extra-axial collection or mass lesion/mass effect. Vascular: Hyperdense right M1 segment Skull: No acute finding. Sequela of dental disease seen at the maxilla on the right. Sinuses/Orbits: Gaze to the right, correlating with the vascular  findings. Other: These results were called by telephone at the time of interpretation on 10/05/2017 at 3:10 pm to Dr. Roda Shutters , who was already aware of the findings. CTA is pending as permitted by tPA administration coordination ASPECTS [facility] Stroke Program Early CT Score) - Ganglionic level infarction (caudate, lentiform nuclei, internal capsule, insula, M1-M3 cortex): 7 - Supraganglionic infarction (M4-M6 cortex): 3 Total score (0-10 with 10 being normal): 10 IMPRESSION: Hyperdense right MCA consistent with thrombosis in this setting. No acute hemorrhage. Aspects is 10. Electronically Signed   By: Marnee Spring M.D.   On: 10/05/2017 15:13       Valentina Lucks, MSN, NP-C [facility] [phone]  10/05/2017, 3:16 PM   Assessment: 57 y.o. male with PMH significant for HTN, CAD, NSTEMI ( 06/2016) presents to ED with suddent onset of slurred speech, right gaze, left side flaccid, and neglecting left side. Code stroke was initiated by EMS. LVO =4. CT head: showed Hyperdense MCA. CTA head and neck showed a right ICA occlusion. Large vessel occlusion patient given TPA and sent to IR. Further stroke work up needed.   Stroke Risk Factors - hyperlipidemia, hypertension and smoking    Recommend -- BP goal :per neuro IR recommendations  --MRI Brain  --CTA  --Echocardiogram -- ASA 81mg  -- High intensity Statin if LDL > 70  -- HgbA1c, fasting lipid panel -- PT consult, OT consult, Speech consult --Telemetry monitoring --Frequent neuro checks -smoking cessation -- NPO --Stroke swallow screen    --please page stroke NP  Or  PA  Or  MD from 8am -4 pm  as this patient from this time will be  followed by the stroke.   You can look them up on [email or web address]  Password [email or web address]  ATTENDING NOTE: I reviewed above note and agree with the assessment and plan. I have made any additions or clarifications directly to the above note. Pt was seen and examined.   58 year old male with history of hypertension, hyperlipidemia, CAD/NSTEMI in 06/2016, heavy smoker and alcohol abuse presented as code stroke.  He was in the cookout party and found to have acute onset left-sided weakness left facial droop, slurred speech and right-sided gaze deviation.  Last seen normal 1415.  EMS called.  CBG 104 and BP 150/110 eu route.  On arrival, patient has nausea but no vomiting.  Patient found to be awake, alert, follow commands, left neglect, right gaze, left upper and lower extremity weakness, left facial droop, slurred speech, no aphasia.  NIHSS = 18.  CT no acute abnormality, however, right M1 hyperdense sign.  Patient no recent bleeding history, no recent surgery, not on anticoagulation, no contraindication for TPA.  After verbal consent, TPA was given after BP treated with labetalol.  CT head and neck showed right ICA occlusion from bifurcation to terminus.  Right M1 occlusion with distal pial collateral filling.  Interventional neuro radiology contacted and will perform mechanical thrombectomy.  Patient will be admitted to neuro ICU after procedure.  Discussed with Dr. Corliss Skains and Dr. Rubin Payor.   Marvel Plan, MD PhD Stroke Neurology 10/05/2017 4:56 PM  This patient is critically ill due to acute onset stroke, right ICA occlusion, status post TPA and at significant risk of neurological worsening, death form recurrent stroke, hemorrhagic conversion, heart failure, respiratory failure, cerebral edema. This patient's care requires constant monitoring of vital signs, hemodynamics, respiratory and cardiac monitoring, review of multiple databases, neurological  assessment, discussion with family, other specialists and medical decision making of high complexity. I spent 60  minutes of neurocritical care time in the care of this patient. I had long discussion with patient two sisters and patient at bedside, updated pt current condition, treatment plan and potential prognosis. They expressed understanding and appreciation.  I also discussed with Dr. Corliss Skains and Dr. Rubin Payor.

## 2017-10-05 NOTE — Anesthesia Preprocedure Evaluation (Addendum)
Anesthesia Evaluation  Patient identified by MRN, date of birth, ID band Patient awake    Reviewed: Unable to perform ROS - Chart review onlyPreop documentation limited or incomplete due to emergent nature of procedure.  History of Anesthesia Complications Negative for: history of anesthetic complications  Airway Mallampati: II       Dental  (+) Teeth Intact, Edentulous Upper, Dental Advisory Given   Pulmonary Current Smoker,           Cardiovascular hypertension, Pt. on medications + CAD, + Past MI and + Peripheral Vascular Disease       Neuro/Psych CVA, Residual Symptoms negative psych ROS   GI/Hepatic negative GI ROS, Neg liver ROS,   Endo/Other  negative endocrine ROS  Renal/GU Renal InsufficiencyRenal disease     Musculoskeletal  (+) Arthritis ,   Abdominal   Peds  Hematology negative hematology ROS (+)   Anesthesia Other Findings Cath 06/2016:  1. Severe single vessel CAD with total occlusion of the RCA, collateralized from the left coronary artery 2. Mild nonobstructive plaque in the left main, LAD, and LCx 3. Mild segmental LV dysfunction, LVEF 50%  Patient states he has not been taking his B/P med or ASA  Reproductive/Obstetrics                            Anesthesia Physical Anesthesia Plan  ASA: IV and emergent  Anesthesia Plan: General   Post-op Pain Management:    Induction: Intravenous, Rapid sequence and Cricoid pressure planned  PONV Risk Score and Plan: 1 and Treatment may vary due to age or medical condition  Airway Management Planned: Oral ETT  Additional Equipment: Arterial line  Intra-op Plan:   Post-operative Plan: Extubation in OR and Possible Post-op intubation/ventilation  Informed Consent:   History available from chart only and Only emergency history available  Plan Discussed with: CRNA and Surgeon  Anesthesia Plan Comments:          Anesthesia Quick Evaluation

## 2017-10-05 NOTE — Anesthesia Procedure Notes (Signed)
Arterial Line Insertion Start/End8/11/2017 3:45 PM, 10/05/2017 3:50 PM Performed by: Dairl PonderJiang, Fudan, CRNA  Patient location: Pre-op. Preanesthetic checklist: patient identified, IV checked, site marked, risks and benefits discussed, surgical consent, monitors and equipment checked, pre-op evaluation, timeout performed and anesthesia consent Lidocaine 1% used for infiltration Left, radial was placed Catheter size: 20 Fr Hand hygiene performed  and maximum sterile barriers used   Attempts: 1 Procedure performed without using ultrasound guided technique. Following insertion, dressing applied. Post procedure assessment: normal and unchanged

## 2017-10-05 NOTE — ED Notes (Addendum)
Pt arrived in ir 1528

## 2017-10-05 NOTE — Progress Notes (Signed)
Patient ID: [name], male   DOB: Aug 05, 1960, 57 y.o.   MRN: [id number] INR [age] y old RT H M MRSS 0.LSW  At 2 15 pm. Acute onset of RT gaze deviation,Lt hemiplegia . CT Brain NO ICH .Dense RT MCA sign and occluded RT ICA at the bulb. ASPECTS 10  Received IV tpa.Marland Kitchen.  Option of endovascular revascularization of occluded RT MCA ,and RT ICA discussed with sister.  Reasons ,benefits,risks,alternatives D/W sister. Risks of ICH of 10 % ,with worsening neuro decline,vent dependency ,death ,inability to revascularize ,vascular injury all reviewed. Questions answered to sisters satisfaction. Informed witnessed consent obtained for revascularization under GA.  S.Antonela Freiman MD

## 2017-10-05 NOTE — Progress Notes (Signed)
Patient ID: [name], male   DOB: Feb 03, 1961, 57 y.o.   MRN: [id number] INR. POst procedure patient extubated.Maintaing O2 sats. Moving his Lt arm and leg,with No gaze deviation. RT groin soft. Pulses palpable DPs and dopplerable  PTs.  bilaterally.Dyna CT brain  No ICH ,mass effect or midline shift. S.Jeremy Ditullio MD

## 2017-10-05 NOTE — ED Triage Notes (Signed)
Pt here via [facility] for acute onset L sided facial droop, slurred speech, L arm flacidity and R sided gaze.  LSN 1615 witnessed by brother.  bp 150/110, hr 72 nsr, rr 18, cbg 104.

## 2017-10-05 NOTE — Code Documentation (Signed)
Per EMS patient was LKW at 1415, his brother noted that he had a facial droop and was acting different. Code Stroke was called enroute at 1442 Patient arrived via EMS at 1450  Stat head CT and stat labs done.  Foley placed. Full dose TPA given IV at 1513.  CTA head and neck done.  Groins prepped and pedal pulses marked, transported to IR. NIHSS 17  Left side weakness and neglect, right gaze preference, left visual field cut, left sensory loss, slurred speech.   Dr Roda ShuttersXu at bedside to assess patient. Full dose TPA given and transported to IR Dr Corliss Skainseveshwar spoke with patient and family prior to procedure Hand off with IR staff and CRNA

## 2017-10-06 ENCOUNTER — Inpatient Hospital Stay (HOSPITAL_COMMUNITY): Payer: Medicaid Other

## 2017-10-06 DIAGNOSIS — F1410 Cocaine abuse, uncomplicated: Secondary | ICD-10-CM

## 2017-10-06 DIAGNOSIS — I6521 Occlusion and stenosis of right carotid artery: Secondary | ICD-10-CM

## 2017-10-06 DIAGNOSIS — I6601 Occlusion and stenosis of right middle cerebral artery: Secondary | ICD-10-CM

## 2017-10-06 DIAGNOSIS — I5030 Unspecified diastolic (congestive) heart failure: Secondary | ICD-10-CM

## 2017-10-06 LAB — CBC WITH DIFFERENTIAL/PLATELET
Abs Immature Granulocytes: 0 10*3/uL (ref 0.0–0.1)
Basophils Absolute: 0 10*3/uL (ref 0.0–0.1)
Basophils Relative: 1 %
Eosinophils Absolute: 0.3 10*3/uL (ref 0.0–0.7)
Eosinophils Relative: 3 %
HEMATOCRIT: 32.5 % — AB (ref 39.0–52.0)
HEMOGLOBIN: 11 g/dL — AB (ref 13.0–17.0)
Immature Granulocytes: 0 %
LYMPHS ABS: 1.9 10*3/uL (ref 0.7–4.0)
LYMPHS PCT: 23 %
MCH: 30.8 pg (ref 26.0–34.0)
MCHC: 33.8 g/dL (ref 30.0–36.0)
MCV: 91 fL (ref 78.0–100.0)
MONOS PCT: 15 %
Monocytes Absolute: 1.3 10*3/uL — ABNORMAL HIGH (ref 0.1–1.0)
Neutro Abs: 4.9 10*3/uL (ref 1.7–7.7)
Neutrophils Relative %: 58 %
Platelets: 182 10*3/uL (ref 150–400)
RBC: 3.57 MIL/uL — AB (ref 4.22–5.81)
RDW: 14.5 % (ref 11.5–15.5)
WBC: 8.5 10*3/uL (ref 4.0–10.5)

## 2017-10-06 LAB — ECHOCARDIOGRAM COMPLETE
Height: 64 in
Weight: 3044.11 oz

## 2017-10-06 LAB — RAPID URINE DRUG SCREEN, HOSP PERFORMED
AMPHETAMINES: NOT DETECTED
BARBITURATES: NOT DETECTED
BENZODIAZEPINES: NOT DETECTED
Cocaine: POSITIVE — AB
Opiates: NOT DETECTED
Tetrahydrocannabinol: NOT DETECTED

## 2017-10-06 LAB — BASIC METABOLIC PANEL
ANION GAP: 9 (ref 5–15)
BUN: 7 mg/dL (ref 6–20)
CHLORIDE: 109 mmol/L (ref 98–111)
CO2: 21 mmol/L — AB (ref 22–32)
Calcium: 8.4 mg/dL — ABNORMAL LOW (ref 8.9–10.3)
Creatinine, Ser: 1.13 mg/dL (ref 0.61–1.24)
GFR calc non Af Amer: >60 mL/min (ref >60)
GLUCOSE: 107 mg/dL — AB (ref 70–99)
Potassium: 3.5 mmol/L (ref 3.5–5.1)
Sodium: 139 mmol/L (ref 135–145)

## 2017-10-06 LAB — LIPID PANEL
CHOLESTEROL: 193 mg/dL (ref 0–200)
HDL: 59 mg/dL (ref >40)
LDL Cholesterol: 112 mg/dL — ABNORMAL HIGH (ref 0–99)
Total CHOL/HDL Ratio: 3.3 RATIO
Triglycerides: 111 mg/dL (ref <150)
VLDL: 22 mg/dL (ref 0–40)

## 2017-10-06 LAB — HIV ANTIBODY (ROUTINE TESTING W REFLEX): HIV Screen 4th Generation wRfx: NONREACTIVE

## 2017-10-06 MED ORDER — THIAMINE HCL 100 MG/ML IJ SOLN: 100.0000 mg | Freq: Every day | INTRAMUSCULAR | Status: DC

## 2017-10-06 MED ORDER — LORAZEPAM 2 MG/ML IJ SOLN: 1.0000 mg | Freq: Four times a day (QID) | INTRAMUSCULAR | Status: DC | PRN

## 2017-10-06 MED ORDER — ORAL CARE MOUTH RINSE
15.0000 mL | Freq: Two times a day (BID) | OROMUCOSAL | Status: DC
  Administered 2017-10-06 – 2017-10-08 (×5): 15 mL via OROMUCOSAL

## 2017-10-06 MED ORDER — ADULT MULTIVITAMIN W/MINERALS CH
1.0000 | ORAL_TABLET | Freq: Every day | ORAL | Status: DC
  Administered 2017-10-06 – 2017-10-08 (×3): 1 via ORAL
  Filled 2017-10-06 (×3): qty 1

## 2017-10-06 MED ORDER — VITAMIN B-1 100 MG PO TABS
100.0000 mg | ORAL_TABLET | Freq: Every day | ORAL | Status: DC
  Administered 2017-10-06 – 2017-10-08 (×3): 100 mg via ORAL
  Filled 2017-10-06 (×3): qty 1

## 2017-10-06 MED ORDER — LORAZEPAM 1 MG PO TABS
1.0000 mg | ORAL_TABLET | Freq: Four times a day (QID) | ORAL | Status: DC | PRN
  Administered 2017-10-06: 1 mg via ORAL

## 2017-10-06 MED ORDER — ATORVASTATIN CALCIUM 80 MG PO TABS
80.0000 mg | ORAL_TABLET | Freq: Every day | ORAL | Status: DC
  Administered 2017-10-06 – 2017-10-07 (×2): 80 mg via ORAL
  Filled 2017-10-06 (×2): qty 1

## 2017-10-06 MED ORDER — FOLIC ACID 1 MG PO TABS
1.0000 mg | ORAL_TABLET | Freq: Every day | ORAL | Status: DC
  Administered 2017-10-06 – 2017-10-08 (×3): 1 mg via ORAL
  Filled 2017-10-06 (×3): qty 1

## 2017-10-06 MED ORDER — SODIUM CHLORIDE 0.9 % IV SOLN: INTRAVENOUS | Status: DC | PRN

## 2017-10-06 NOTE — Progress Notes (Signed)
Patient ID: [name], male   DOB: [birth year], 57 y.o.   MRN: [id number] Pt on way to radiology for MRI/MRA brain; nurse reports no acute neuro changes; rt groin site stable,NT; intact distal pulses; will reassess again 8/12

## 2017-10-06 NOTE — H&P (Signed)
NEURO STROKE TEAM ADMISSION NOTE      Chief Complaint:  Right gaze deviation, slurred speech, left side flaccid  History obtained from:  Patient    HPI:                                                                                                                                         Kevin Barton is an 57 y.o. male with PMH significant for HTN, CAD, NSTEMI ( 06/2016) presents to ED with suddent onset of slurred speech, right gaze, left side flaccid, and neglecting left side. Code stroke was initiated by EMS. LVO =4.   Patient stated that he woke up this morning in his usual state of health. Has been sitting outside most of the day helping his brother at cookout on [address]. At 1415 his brother noticed that his face was drooping and he was "different". EMS was called and code stroke activated. Patient does not take daily ASA of 81 mg not prescribed any blood thinners. Patient is a smoker.  ED course:  BG: 104, creatinine 1.60, BP: 140/90 at arrival, then elevated to 190's. 10 mg of labetalol given and TPA bolus started at 1513. Foley inserted prior to TPA bolus starting. Patient transported to IR after CTA.  CT head: Hyperdense right MCA.  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 2. Retrograde flow into the right A1 segment via a patent anterior communicating artery. There is slight opacification into the ICA terminus. 3. Some right MCA branch vessels are reconstituted via pial collaterals. 4. Moderate proximal narrowing in the left M2 branch vessels without focal occlusion. 5. Mild distal PCA branch vessel irregularity without a significant posterior circulation stenosis or occlusion. 6. Bilateral maxillary dental disease. 7. Edema or atelectasis throughout the lung apices.   No prior stroke history noted.    Date last known well: Date: 10/05/2017 Time last known  well: Time: 14:15 tPA Given: Yes  Bolus started at 1513 Modified Rankin: Rankin Score=0  NIHSS: 18 1a Level of Conscious:0 1b LOC Questions: 0 1c LOC Commands: 0 2 Best Gaze: 2 3 Visual: 2 4 Facial Palsy: 1 5a Motor Arm - left: 4 5b Motor Arm - Right:0  6a Motor Leg - Left: 4 6b Motor Leg - Right: 0 7 Limb Ataxia: 0 8 Sensory: 2 9 Best Language: 0 10 Dysarthria:1 11 Extinct. and Inattention:2 TOTAL:18        Past Medical History:  Diagnosis Date  . Arthritis   . CAD (coronary artery disease)    cath 06/2016 occlusion of RCA with collateralization from the left coronary artery. Mild nonobstructive plaque in the left main, LAD, and LCx. LVEF 50%. TX Rx.   . Chronic back pain   . Chronic knee pain   . HTN (hypertension)   . Tobacco abuse  Past Surgical History:  Procedure Laterality Date  . LEFT HEART CATH AND CORONARY ANGIOGRAPHY N/A 07/25/2016   Procedure: Left Heart Cath and Coronary Angiography;  Surgeon: Tonny Bollman, MD;  Location: [facility] INVASIVE CV LAB;  Service: Cardiovascular;  Laterality: N/A;         Family History  Problem Relation Age of Onset  . Diabetes Brother       Social History:  reports that he has been smoking cigarettes. He has been smoking about 0.50 packs per day. He has never used smokeless tobacco. He reports that he drinks alcohol. He reports that he does not use drugs.  Allergies: No Known Allergies  Medications:                                                                                                                                    Current Facility-Administered Medications  Medication Dose Route Frequency Provider Last Rate Last Dose  . alteplase (ACTIVASE) 1 mg/mL infusion 78.5 mg  0.9 mg/kg Intravenous Once Marvel Plan, MD       Followed by  . 0.9 %  sodium chloride infusion  50 mL Intravenous Once Marvel Plan, MD      . clevidipine (CLEVIPREX) 0.5 MG/ML infusion           . iopamidol  (ISOVUE-370) 76 % injection 50 mL  50 mL Intravenous Once PRN Marvel Plan, MD      . labetalol (NORMODYNE,TRANDATE) injection 10 mg  10 mg Intravenous Once Marvel Plan, MD             Current Outpatient Medications  Medication Sig Dispense Refill  . aspirin EC 81 MG tablet Take 81 mg by mouth daily.    Marland Kitchen ibuprofen (ADVIL,MOTRIN) 800 MG tablet Take 800 mg by mouth every 8 (eight) hours as needed for moderate pain.    . isosorbide mononitrate (IMDUR) 30 MG 24 hr tablet Take 1 tablet (30 mg total) by mouth daily. 90 tablet 3  . lisinopril-hydrochlorothiazide (PRINZIDE,ZESTORETIC) 20-25 MG tablet Take 1 tablet by mouth daily. 90 tablet 3  . nicotine (NICODERM CQ) 14 mg/24hr patch Place 1 patch (14 mg total) onto the skin daily. 28 patch 0  . nitroGLYCERIN (NITROSTAT) 0.4 MG SL tablet Place 1 tablet (0.4 mg total) under the tongue every 5 (five) minutes as needed for chest pain. 25 tablet 3  . simvastatin (ZOCOR) 40 MG tablet Take 1 tablet (40 mg total) by mouth at bedtime. 90 tablet 3     ROS:  History obtained from the patient  General ROS: negative for - chills, fatigue, fever, night sweats, weight gain or weight loss  Ophthalmic ROS: negative for - blurry vision, double vision, eye pain or loss of vision Respiratory ROS: negative for - cough,  shortness of breath or wheezing Cardiovascular ROS: negative for - chest pain, dyspnea on exertion,  Musculoskeletal ROS: negative for - joint swelling or muscular weakness ( patient is neglecting left side) Neurological ROS: as noted in HPI   General Examination:                                                                                                      Weight 87.2 kg.  HEENT-  Normocephalic, no lesions, without obvious abnormality.  Normal external eye and conjunctiva.  Cardiovascular-  pulses  palpable throughout   Lungs- no excessive working breathing.  Saturations within normal limits on RA Extremities- Warm, dry and intact Musculoskeletal-no joint tenderness, deformity or swelling Skin-warm and dry, no hyperpigmentation, vitiligo, or suspicious lesions  Neurological Examination Mental Status: Alert, oriented, thought content appropriate.  Speech fluent without evidence of aphasia. Mild dysarthria noted. Able to follow commands without difficulty. Cranial Nerves: II: Visual fields grossly normal,  III,IV, VI: ptosis not present, right gaze preference, but able to cross midline, extra-ocular motions intact bilaterally, 3 mm pupils equal, round, reactive to light and accommodation V,VII: smile symmetric, facial light touch sensation normal bilaterally VIII: hearing normal bilaterally IX,X: uvula rises symmetrically XI: bilateral shoulder shrug XII: midline tongue extension Motor: Right :  Upper extremity   5/5                                      Left:     Upper extremity   0/5             Lower extremity   5/5                                                  Lower extremity   0/5 Tone and bulk:normal tone throughout; no atrophy noted Sensory: right side normal; left side neglecting and does not feel anything.  Plantars: Right: downgoing                                Left: downgoing Cerebellar: deferred Gait: deferred   Lab Results: Basic Metabolic Panel: LastLabs     Recent Labs  Lab 10/05/17 1458  NA 139  K 3.8  CL 106  GLUCOSE 98  BUN 12  CREATININE 1.60*      CBC: LastLabs      Recent Labs  Lab 10/05/17 1452 10/05/17 1458  WBC 6.8  --   NEUTROABS 2.6  --   HGB 13.0 13.6  HCT 37.7* 40.0  MCV 89.8  --  PLT 220  --       Lipid Panel: LastLabs  No results for input(s): CHOL, TRIG, HDL, CHOLHDL, VLDL, LDLCALC in the last 168 hours.    CBG: LastLabs     Recent Labs  Lab 10/05/17 1453  GLUCAP 101*       Imaging:  ImagingResults(Last48hours)  Ct Head Code Stroke Wo Contrast  Result Date: 10/05/2017 CLINICAL DATA:  Code stroke.  Focal neuro deficit EXAM: CT HEAD WITHOUT CONTRAST TECHNIQUE: Contiguous axial images were obtained from the base of the skull through the vertex without intravenous contrast. COMPARISON:  11/23/2006 FINDINGS: Brain: No evidence of acute infarction, hemorrhage, hydrocephalus, extra-axial collection or mass lesion/mass effect. Vascular: Hyperdense right M1 segment Skull: No acute finding. Sequela of dental disease seen at the maxilla on the right. Sinuses/Orbits: Gaze to the right, correlating with the vascular findings. Other: These results were called by telephone at the time of interpretation on 10/05/2017 at 3:10 pm to Dr. Roda Shutters , who was already aware of the findings. CTA is pending as permitted by tPA administration coordination ASPECTS [facility] Stroke Program Early CT Score) - Ganglionic level infarction (caudate, lentiform nuclei, internal capsule, insula, M1-M3 cortex): 7 - Supraganglionic infarction (M4-M6 cortex): 3 Total score (0-10 with 10 being normal): 10 IMPRESSION: Hyperdense right MCA consistent with thrombosis in this setting. No acute hemorrhage. Aspects is 10. Electronically Signed   By: Marnee Spring M.D.   On: 10/05/2017 15:13        Valentina Lucks, MSN, NP-C [facility] [phone]  10/05/2017, 3:16 PM   Assessment: 57 y.o. male with PMH significant for HTN, CAD, NSTEMI ( 06/2016) presents to ED with suddent onset of slurred speech, right gaze, left side flaccid, and neglecting left side. Code stroke was initiated by EMS. LVO =4. CT head: showed Hyperdense MCA. CTA head and neck showed a right ICA occlusion. Large vessel occlusion patient given TPA and sent to IR. Further stroke work up needed.   Stroke Risk Factors - hyperlipidemia, hypertension and smoking    Recommend -- BP goal :per neuro IR recommendations  --MRI  Brain  --CTA  --Echocardiogram -- ASA 81mg  -- High intensity Statin if LDL > 70  -- HgbA1c, fasting lipid panel -- PT consult, OT consult, Speech consult --Telemetry monitoring --Frequent neuro checks -smoking cessation -- NPO --Stroke swallow screen    --please page stroke NP Or PA Or MD from 8am -4 pm as this patient from this time will be followed by the stroke. You can look them up on [email or web address] Password [email or web address]  ATTENDING NOTE: I reviewed above note and agree with the assessment and plan. I have made any additions or clarifications directly to the above note. Pt was seen and examined.   57 year old male with history of hypertension, hyperlipidemia, CAD/NSTEMI in 06/2016, heavy smoker and alcohol abuse presented as code stroke.  He was in the cookout party and found to have acute onset left-sided weakness left facial droop, slurred speech and right-sided gaze deviation.  Last seen normal 1415.  EMS called.  CBG 104 and BP 150/110 eu route.  On arrival, patient has nausea but no vomiting.  Patient found to be awake, alert, follow commands, left neglect, right gaze, left upper and lower extremity weakness, left facial droop, slurred speech, no aphasia.  NIHSS = 18.  CT no acute abnormality, however, right M1 hyperdense sign.  Patient no recent bleeding history, no recent surgery, not on anticoagulation, no contraindication for TPA.  After verbal consent, TPA was  given after BP treated with labetalol.  CT head and neck showed right ICA occlusion from bifurcation to terminus.  Right M1 occlusion with distal pial collateral filling.  Interventional neuro radiology contacted and will perform mechanical thrombectomy.  Patient will be admitted to neuro ICU after procedure.  Discussed with Dr. Corliss Skainseveshwar and Dr. Rubin PayorPickering.   Marvel PlanJindong Trayvond Viets, MD PhD Stroke Neurology 10/05/2017 4:56 PM  This patient is critically ill due to acute onset stroke, right ICA occlusion, status post TPA and at  significant risk of neurological worsening, death form recurrent stroke, hemorrhagic conversion, heart failure, respiratory failure, cerebral edema. This patient's care requires constant monitoring of vital signs, hemodynamics, respiratory and cardiac monitoring, review of multiple databases, neurological assessment, discussion with family, other specialists and medical decision making of high complexity. I spent 60 minutes of neurocritical care time in the care of this patient. I had long discussion with patient two sisters and patient at bedside, updated pt current condition, treatment plan and potential prognosis. They expressed understanding and appreciation.  I also discussed with Dr. Corliss Skainseveshwar and Dr. Rubin PayorPickering.

## 2017-10-06 NOTE — Progress Notes (Signed)
PT Cancellation Note  Patient Details Name: Kevin RiasDarryl Barton MRN: [id number] DOB: Sep 17, 1960   Cancelled Treatment:    Reason Eval/Treat Not Completed: Active bedrest order   Need increased activity orders to proceed with PT/OT evaluations;   Will follow,   Van ClinesHolly Holliday Sheaffer, PT  Acute Rehabilitation Services Pager [phone] Office [phone]    Kevin Barton 10/06/2017, 7:31 AM

## 2017-10-06 NOTE — Care Management (Signed)
Techs:[facility]ne Whidby [facility] [place] [facility] PT id w/ name & DOB. PT was in fetal position upon arrival. 9 Fr Sheath was pulled while tech [place] healed pressure with v-pad obtaining hemostasis within 30 minutes. PT did not tolerate well. He moved a lot and was in pain. Gauze and tegaderm was used along with pressure dressing to dress the wound.

## 2017-10-06 NOTE — Progress Notes (Signed)
SLP Cancellation Note  Patient Details Name: Kevin RiasDarryl Barton MRN: [id number] DOB: 1960-07-23   Cancelled treatment:       Reason Eval/Treat Not Completed: Medical issues which prohibited therapy. Pt still laying flat. Will f/u for cognitive eval when better able to participate.    [name], [name] 10/06/2017, 10:38 AM

## 2017-10-06 NOTE — Progress Notes (Signed)
STROKE TEAM PROGRESS NOTE   SUBJECTIVE (INTERVAL HISTORY) His RN is at the bedside.  Pt has been doing well. Extubated after procedure. Just had right femoral sheath removed. Pending swallow. He basically neuro intact now. However, still has bradycardia and BP on the low side 100s, likely related to carotid stent. On soft C-collar.    OBJECTIVE Temp:  [97.8 F (36.6 C)-100.1 F (37.8 C)] 98.1 F (36.7 C) (08/11 0800) Pulse Rate:  [41-115] 47 (08/11 0835) Cardiac Rhythm: Sinus bradycardia (08/11 0730) Resp:  [10-25] 11 (08/11 0500) BP: (75-205)/(39-162) 81/40 (08/11 0835) SpO2:  [88 %-100 %] 100 % (08/11 0835) Arterial Line BP: (91-140)/(31-75) 105/35 (08/11 0835) Weight:  [86.3 kg-87.2 kg] 86.3 kg (08/10 2100)  CBC:  Recent Labs  Lab 10/05/17 1452 10/05/17 1458 10/06/17 0500  WBC 6.8  --  8.5  NEUTROABS 2.6  --  4.9  HGB 13.0 13.6 11.0*  HCT 37.7* 40.0 32.5*  MCV 89.8  --  91.0  PLT 220  --  182    Basic Metabolic Panel:  Recent Labs  Lab 10/05/17 1452 10/05/17 1458 10/06/17 0500  NA 138 139 139  K 3.8 3.8 3.5  CL 107 106 109  CO2 24  --  21*  GLUCOSE 103* 98 107*  BUN 11 12 7   CREATININE 1.61* 1.60* 1.13  CALCIUM 9.2  --  8.4*    Lipid Panel:     Component Value Date/Time   CHOL 193 10/06/2017 0500   CHOL 249 (H) 05/23/2017 1204   TRIG 111 10/06/2017 0500   HDL 59 10/06/2017 0500   HDL 82 05/23/2017 1204   CHOLHDL 3.3 10/06/2017 0500   VLDL 22 10/06/2017 0500   LDLCALC 112 (H) 10/06/2017 0500   LDLCALC 154 (H) 05/23/2017 1204   HgbA1c: No results found for: HGBA1C Urine Drug Screen:     Component Value Date/Time   LABOPIA NONE DETECTED 11/23/2006 0055   COCAINSCRNUR POSITIVE (A) 11/23/2006 0055   LABBENZ NONE DETECTED 11/23/2006 0055   AMPHETMU NONE DETECTED 11/23/2006 0055   THCU NONE DETECTED 11/23/2006 0055   LABBARB  11/23/2006 0055    NONE DETECTED        DRUG SCREEN FOR MEDICAL PURPOSES ONLY.  IF CONFIRMATION IS NEEDED FOR ANY PURPOSE,  NOTIFY LAB WITHIN 5 DAYS.    Alcohol Level     Component Value Date/Time   ETH (H) 11/23/2006 0055    141        LOWEST DETECTABLE LIMIT FOR SERUM ALCOHOL IS 11 mg/dL FOR MEDICAL PURPOSES ONLY    IMAGING I have personally reviewed the radiological images below and agree with the radiology interpretations.  Ct Angio Head W Or Wo Contrast Ct Angio Neck W And/or Wo Contrast 10/05/2017 IMPRESSION:  1. 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  2. Retrograde flow into the right A1 segment via a patent anterior communicating artery. There is slight opacification into the ICA terminus.  3. Some right MCA branch vessels are reconstituted via pial collaterals.  4. Moderate proximal narrowing in the left M2 branch vessels without focal occlusion.  5. Mild distal PCA branch vessel irregularity without a significant posterior circulation stenosis or occlusion. 6. Bilateral maxillary dental disease.  7. Edema or atelectasis throughout the lung apices.   Ct Head Code Stroke Wo Contrast 10/05/2017 IMPRESSION:  Hyperdense right MCA consistent with thrombosis in this setting. No acute hemorrhage. Aspects is 10.   MRI / MRA - pending 10/06/2017  DSA 10/05/2017 S/P bilateral common carotid arteriogram,RT Vert angiogram followed by complete revascularization of RT MCA M1 occlusion with x 1 pass with 5mm x 33 mm embotrap device achieving a TICI 3 reperfusion and revascularization of acutely occluded RT ICA prox with stent assisted angioplasty.   Transthoracic Echocardiogram - pending 00/00/00    PHYSICAL EXAM  Temp:  [97.8 F (36.6 C)-100.1 F (37.8 C)] 98.1 F (36.7 C) (08/11 0800) Pulse Rate:  [41-115] 47 (08/11 0835) Resp:  [10-25] 11 (08/11 0500) BP: (75-205)/(39-162) 81/40 (08/11 0835) SpO2:  [88 %-100 %] 100 % (08/11 0835) Arterial Line BP: (91-140)/(31-75) 105/35 (08/11 0835) Weight:  [86.3 kg-87.2 kg] 86.3 kg (08/10  2100)  General - Well nourished, well developed, in no apparent distress.  Ophthalmologic - fundi not visualized due to noncooperation.  Cardiovascular - Regular rhythm but tachycardia.  Mental Status -  Level of arousal and orientation to time, place, and person were intact. Language including expression, naming, repetition, comprehension was assessed and found intact. Fund of Knowledge was assessed and was intact.  Cranial Nerves II - XII - II - Visual field intact OU. III, IV, VI - Extraocular movements intact. V - Facial sensation intact bilaterally. VII - Facial movement intact bilaterally. VIII - Hearing & vestibular intact bilaterally. X - Palate elevates symmetrically. XI - Chin turning & shoulder shrug intact bilaterally. XII - Tongue protrusion intact.  Motor Strength - The patient's strength was normal in all extremities except not able to bend RLE due to sheath removal and pronator drift was absent.  Bulk was normal and fasciculations were absent.   Motor Tone - Muscle tone was assessed at the neck and appendages and was normal.  Reflexes - The patient's reflexes were symmetrical in all extremities and he had no pathological reflexes.  Sensory - Light touch, temperature/pinprick were assessed and were symmetrical.    Coordination - The patient had normal movements in the hands with no ataxia or dysmetria.  Tremor was absent.  Gait and Station - deferred.   ASSESSMENT/PLAN Mr. Kairon Shock is a 57 y.o. male with history of HTN, CAD, NSTEMI ( 06/2016), tobacco use, and substance abuse presenting with slurred speech, right gaze, left side flaccid, and neglecting left side. IV TPA Saturday August 10th bolus started at 1513. Mechanical thrombectomy Rt MCA TICI3 with stent assisted angioplasty of Rt ICA.  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  CTA H&N - Rt ICA occluded from the bifurcation through the ICA terminus. Right M1 occlusion with some pial collateral flow distally  MRI head - pending  MRA head - pending  2D Echo - pending  UDS pending  LDL - 112  HgbA1c - pending  HIV/RPR - pending  VTE prophylaxis - SCDs  Diet NPO for now  aspirin 81 mg daily prior to admission, now on aspirin 81 mg daily and Brilinta 90 mg BID and s/p tPA.  Patient counseled to be compliant with his antithrombotic medications  Ongoing aggressive stroke risk factor management  Therapy recommendations:  pending  Disposition:  Pending  Extra- and intracranial stenosis  CTA head and neck showed right ICA and MCA occlusion, left ICA bifurcation athero, left M2 proximal moderate narrowing  Significant  risk factor with smoking, alcohol and cocaine use  S/p right ICA stenting  On ASA and brilinta  Hypertension/now hypotension  BP low . BP goal 100-140 . Currently low BP likely due to carotid stenting  Bradycardia  HR 40s  Likely due to carotid stenting  Tele monitoring closely  Atropine if needed  Hyperlipidemia  Lipid lowering medication PTA:  Zocor 40 mg daily  LDL 112, goal < 70  Current lipid lowering medication: Lipitor 80 mg daily  Continue statin at discharge  Tobacco abuse  Current heavy smoker  Smoking cessation counseling provided  Pt is willing to quit  Alcohol use  Last use Friday - 2 beers  On CIWA protocol  On FA/B1/MVI  Education on limit alcohol use  Cocaine use  Hx of cocaine use and positive on UDS  Admitted last use last week  UDS pending  Cocaine cessation education provided  Other Stroke Risk Factors  Obesity, Body mass index is 32.66 kg/m., recommend weight loss, diet and exercise as appropriate   Coronary  artery disease 07/2016 s/p stenting - on ASA and zocor at home - following with cardiology  Other Active Problems  Mild anemia - due to acute blood loss from procedure  Hospital day # 1  This patient is critically ill due to stroke, carotid occlusion, s/p stenting, hypotension and bradycardia, alcohol use and at significant risk of neurological worsening, death form recurrent stroke, stent reocclusion, cardiac arrest, DT, cerebral edema and hemorrhagic conversion. This patient's care requires constant monitoring of vital signs, hemodynamics, respiratory and cardiac monitoring, review of multiple databases, neurological assessment, discussion with family, other specialists and medical decision making of high complexity. I spent 40 minutes of neurocritical care time in the care of this patient.  Marvel PlanJindong Stephonie Wilcoxen, MD PhD Stroke Neurology 10/06/2017 11:12 AM   To contact Stroke Continuity provider, please refer to [email or web address]. After hours, contact General Neurology

## 2017-10-06 NOTE — Progress Notes (Signed)
  Echocardiogram 2D Echocardiogram has been performed.  Janalyn HarderWest, Yatzari Jonsson R 10/06/2017, 1:33 PM

## 2017-10-06 NOTE — Progress Notes (Signed)
OT Cancellation Note  Patient Details Name: Kevin RiasDarryl Barton MRN: [id number] DOB: January 27, 1961   Cancelled Treatment:    Reason Eval/Treat Not Completed: Patient not medically ready(sheath in place/ pending post sheath bedrest)  Felecia ShellingJones, Celita Aron B   Orlandis Sanden, Brynn   OTR/L Pager: [phone] Office: [phone] .  10/06/2017, 8:28 AM

## 2017-10-07 ENCOUNTER — Inpatient Hospital Stay (HOSPITAL_COMMUNITY): Payer: Medicaid Other

## 2017-10-07 ENCOUNTER — Encounter (HOSPITAL_COMMUNITY): Payer: Self-pay | Admitting: Interventional Radiology

## 2017-10-07 DIAGNOSIS — I63511 Cerebral infarction due to unspecified occlusion or stenosis of right middle cerebral artery: Secondary | ICD-10-CM

## 2017-10-07 LAB — CBC
HEMATOCRIT: 31.3 % — AB (ref 39.0–52.0)
Hemoglobin: 10.9 g/dL — ABNORMAL LOW (ref 13.0–17.0)
MCH: 31.1 pg (ref 26.0–34.0)
MCHC: 34.8 g/dL (ref 30.0–36.0)
MCV: 89.2 fL (ref 78.0–100.0)
Platelets: 169 10*3/uL (ref 150–400)
RBC: 3.51 MIL/uL — ABNORMAL LOW (ref 4.22–5.81)
RDW: 14.5 % (ref 11.5–15.5)
WBC: 7.2 10*3/uL (ref 4.0–10.5)

## 2017-10-07 LAB — BASIC METABOLIC PANEL
Anion gap: 7 (ref 5–15)
BUN: 6 mg/dL (ref 6–20)
CALCIUM: 8.5 mg/dL — AB (ref 8.9–10.3)
CO2: 24 mmol/L (ref 22–32)
CREATININE: 1.03 mg/dL (ref 0.61–1.24)
Chloride: 108 mmol/L (ref 98–111)
GFR calc non Af Amer: >60 mL/min (ref >60)
Glucose, Bld: 110 mg/dL — ABNORMAL HIGH (ref 70–99)
Potassium: 3.7 mmol/L (ref 3.5–5.1)
SODIUM: 139 mmol/L (ref 135–145)

## 2017-10-07 LAB — HEMOGLOBIN A1C
HEMOGLOBIN A1C: 5.5 % (ref 4.8–5.6)
MEAN PLASMA GLUCOSE: 111 mg/dL

## 2017-10-07 LAB — PLATELET INHIBITION P2Y12: Platelet Function  P2Y12: 59 [PRU] — ABNORMAL LOW (ref 194–418)

## 2017-10-07 MED ORDER — PANTOPRAZOLE SODIUM 40 MG PO TBEC
40.0000 mg | DELAYED_RELEASE_TABLET | Freq: Every day | ORAL | Status: DC
  Administered 2017-10-08: 40 mg via ORAL
  Filled 2017-10-07: qty 1

## 2017-10-07 MED ORDER — FAMOTIDINE 20 MG PO TABS
20.0000 mg | ORAL_TABLET | Freq: Two times a day (BID) | ORAL | Status: DC
  Administered 2017-10-07: 20 mg via ORAL
  Filled 2017-10-07: qty 1

## 2017-10-07 NOTE — Evaluation (Signed)
Physical Therapy Evaluation Patient Details Name: Kevin RiasDarryl Chovanec MRN: [id number] DOB: 06-09-1960 Today's Date: 10/07/2017   History of Present Illness  Martell Darlyn ReadStacks is an 57 y.o. male with PMH significant for HTN, CAD, NSTEMI ( 06/2016) presents to ED with suddent onset of slurred speech, right gaze, left side flaccid, and neglecting left side.  MRI showed right BG and caudate head infarct as well as punctate posterior frontal small infarcts, consistent with right ICA occlusion.  He is s/p TPA and Mechanical thrombectomy Rt MCA TICI3 with stent assisted angioplasty of Rt ICA.  Clinical Impression  Patient presents with decreased mobility due to bedrest and h/o R knee pain (?arthritic) with c/o increased stiffness.  Able to ambulate with cane, but walker seems easier and pt agreed to use to assist at d/c.  Feel likely will not need post acute PT, but will follow acutely for further gait training, safety and stroke education.     Follow Up Recommendations No PT follow up    Equipment Recommendations  Rolling walker with 5" wheels    Recommendations for Other Services       Precautions / Restrictions Precautions Precautions: Fall      Mobility  Bed Mobility Overal bed mobility: Modified Independent                Transfers Overall transfer level: Needs assistance   Transfers: Sit to/from Stand Sit to Stand: Min guard         General transfer comment: for safety/balance  Ambulation/Gait Ambulation/Gait assistance: Min guard;Supervision Gait Distance (Feet): 250 Feet Assistive device: 1 person hand held assist;Straight cane;Rolling walker (2 wheeled) Gait Pattern/deviations: Step-to pattern;Decreased stride length;Antalgic;Wide base of support     General Gait Details: initially with HHA on L, then with cane on L due to R knee pain, then with RW; noted R hip external rotation throughout; second time up walking in room to bathroom RW and S only   Stairs             Wheelchair Mobility    Modified Rankin (Stroke Patients Only) Modified Rankin (Stroke Patients Only) Pre-Morbid Rankin Score: No significant disability Modified Rankin: Moderately severe disability     Balance Overall balance assessment: Needs assistance   Sitting balance-Leahy Scale: Good       Standing balance-Leahy Scale: Fair Standing balance comment: standing static no UE support but weight shifted on L LE and R LE turned out with little weight bearing                             Pertinent Vitals/Pain Pain Assessment: Faces Faces Pain Scale: Hurts little more Pain Location: R knee stiff Pain Descriptors / Indicators: Tightness Pain Intervention(s): Monitored during session;Repositioned    Home Living Family/patient expects to be discharged to:: Private residence Living Arrangements: Alone Available Help at Discharge: Family;Friend(s) Type of Home: Other(Comment)(right now in hotel, reports to move into apartment in a week) Home Access: Elevator     Home Layout: One level Home Equipment: None      Prior Function Level of Independence: Independent         Comments: limping for awhile on a bad R knee     Hand Dominance   Dominant Hand: Right    Extremity/Trunk Assessment   Upper Extremity Assessment Upper Extremity Assessment: Defer to OT evaluation    Lower Extremity Assessment Lower Extremity Assessment: Overall WFL for tasks assessed  Communication   Communication: No difficulties  Cognition Arousal/Alertness: Awake/alert Behavior During Therapy: WFL for tasks assessed/performed Overall Cognitive Status: Within Functional Limits for tasks assessed                                        General Comments General comments (skin integrity, edema, etc.): Agrees needs to take his medications to avoid this from happening again    Exercises     Assessment/Plan    PT Assessment Patient needs continued PT  services  PT Problem List Decreased mobility;Decreased balance;Decreased knowledge of use of DME;Pain       PT Treatment Interventions DME instruction;Gait training;Patient/family education;Stair training;Functional mobility training    PT Goals (Current goals can be found in the Care Plan section)  Acute Rehab PT Goals Patient Stated Goal: to return to independent PT Goal Formulation: With patient Time For Goal Achievement: 10/14/17 Potential to Achieve Goals: Good    Frequency Min 4X/week   Barriers to discharge        Co-evaluation               AM-PAC PT "6 Clicks" Daily Activity  Outcome Measure Difficulty turning over in bed (including adjusting bedclothes, sheets and blankets)?: A Little Difficulty moving from lying on back to sitting on the side of the bed? : A Little Difficulty sitting down on and standing up from a chair with arms (e.g., wheelchair, bedside commode, etc,.)?: A Little Help needed moving to and from a bed to chair (including a wheelchair)?: A Little Help needed walking in hospital room?: A Little Help needed climbing 3-5 steps with a railing? : A Little 6 Click Score: 18    End of Session Equipment Utilized During Treatment: Gait belt Activity Tolerance: Patient tolerated treatment well Patient left: in bed;with call bell/phone within reach   PT Visit Diagnosis: Other abnormalities of gait and mobility (R26.89);Pain Pain - Right/Left: Right Pain - part of body: Knee    Time: [id number] PT Time Calculation (min) (ACUTE ONLY): 32 min   Charges:   PT Evaluation $PT Eval Moderate Complexity: 1 Mod PT Treatments $Gait Training: 8-22 mins        Homesteadyndi Marisol Giambra, [place]PT [phone] 10/07/2017   Elray Mcgregorynthia Keziyah Kneale 10/07/2017, 5:05 PM

## 2017-10-07 NOTE — Progress Notes (Addendum)
Preliminary notes--Bilateral carotid duplex exam completed. Right ICA stent patent with less than 50% stenosis. Left CCA defused hypoechoic irregular plaque appears in mid CCA with intimal thickening, Left ICA velocity classified as 1-39% according to the category of velocity.  Bilateral vertebral arteries patent with antegrade flow.   Hongying Braeson Rupe (RDMS RVT) 10/07/17 3:13 PM

## 2017-10-07 NOTE — Progress Notes (Signed)
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

## 2017-10-07 NOTE — Progress Notes (Signed)
SLP Cancellation Note  Patient Details Name: Dolly RiasDarryl Claros MRN: [id number] DOB: August 13, 1960   Cancelled treatment:        Patient off floor for vascular procedure.  Lindalou HoseSarah J. Aneudy Champlain, MA, CCC-SLP 10/07/2017 2:57 PM

## 2017-10-07 NOTE — Progress Notes (Signed)
OT Cancellation Note  Patient Details Name: Kevin RiasDarryl Barton MRN: [id number] DOB: January 28, 1961   Cancelled Treatment:    Reason Eval/Treat Not Completed: Patient not medically ready(Bedrest)  Felecia ShellingJones, Jimmy Stipes B   Gilmar Bua, Brynn   OTR/L Pager: [phone] Office: [phone] .  10/07/2017, 6:51 AM

## 2017-10-07 NOTE — Progress Notes (Addendum)
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 Corliss Skainseveshwar. Patient awake and alert laying in bed with no complaints at this time. No focal neurological symptoms noted. Can spontaneously move all extremities. Right groin incision c/d/i.  MRI/MRA brain/head 10/06/2017: 1. Acute infarct in the right stratum. Two punctate acute infarcts in the right parietal cortex. 2. 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 3. Significantly motion degraded. 4. Severely motion degraded. There is diagnostic visualization of the bilateral M1 segments which are widely patent.   Allergies: Patient has no known allergies.  Medications: Prior to Admission medications   Medication Sig Start Date End Date Taking? Authorizing Provider  aspirin EC 81 MG tablet Take 81 mg by mouth daily.    [provider]  ibuprofen (ADVIL,MOTRIN) 800 MG tablet Take 800 mg by mouth every 8 (eight) hours as needed for moderate pain.    [provider]  isosorbide mononitrate (IMDUR) 30 MG 24 hr tablet Take 1 tablet (30 mg total) by mouth daily. 05/23/17 08/21/17  Berton BonHammond, Janine, NP  lisinopril-hydrochlorothiazide (PRINZIDE,ZESTORETIC) 20-25 MG tablet Take 1 tablet by mouth daily. 05/23/17   Berton BonHammond, Janine, NP  nicotine (NICODERM CQ) 14 mg/24hr patch Place 1 patch (14 mg total) onto the skin daily. 05/23/17   Berton BonHammond, Janine, NP  nitroGLYCERIN (NITROSTAT) 0.4 MG SL tablet Place 1 tablet (0.4 mg total) under the tongue every 5 (five) minutes as needed for chest pain. 10/25/16   Tonny Bollmanooper, Michael, MD  simvastatin (ZOCOR) 40 MG tablet Take 1 tablet (40 mg total) by mouth at bedtime. 05/23/17 08/21/17  Berton BonHammond, Janine, NP     Vital Signs: BP 104/60 (BP Location: Left Arm)   Pulse (!) 54   Temp 98.4 F (36.9 C) (Oral)   Resp  14   Ht 5\' 4"  (1.626 m)   Wt 190 lb 4.1 oz (86.3 kg)   SpO2 100%   BMI 32.66 kg/m   Physical Exam  Constitutional: He appears well-developed and well-nourished. No distress.  Pulmonary/Chest: Effort normal. No respiratory distress.  Neurological:  Alert, awake, and oriented x3. Speech and comprehension intact. PERRL bilaterally. EOMs intact bilaterally without nystagmus or subjective diplopia. Visual fields not assessed. No facial asymmetry. Tongue midline. Motor power symmetric proportional to effort. No pronator drift. Fine motor and coordination intact and symmetric. Gait not assessed. Romberg not assessed. Heel to toe not assessed Distal pulses 2+ bilaterally.  Skin: Skin is warm and dry.  Right groin incision soft without active bleeding or hematoma.  Psychiatric: He has a normal mood and affect. His behavior is normal. Judgment and thought content normal.  Nursing note and vitals reviewed.   Imaging: Ct Angio Head W Or Wo Contrast  Result Date: 10/05/2017 CLINICAL DATA:  Code stroke. Left-sided weakness. Right-sided gaze. Slurred speech. EXAM: CT ANGIOGRAPHY HEAD AND NECK TECHNIQUE: Multidetector CT imaging of the head and neck was performed using the standard protocol during bolus administration of intravenous contrast. Multiplanar CT image reconstructions and MIPs were obtained to evaluate the vascular anatomy. Carotid stenosis measurements (when applicable) are obtained utilizing NASCET criteria, using the distal internal carotid diameter as the denominator. CONTRAST:  50mL ISOVUE-370 IOPAMIDOL (ISOVUE-370) INJECTION 76% COMPARISON:  CT head without contrast from the same day. FINDINGS: CTA NECK FINDINGS Aortic arch: A 3 vessel arch configuration is present. No significant stenosis or  aneurysm is present. Right carotid system: The right common carotid artery is within normal limits. Atherosclerotic changes are present at the bifurcation. The right internal carotid artery is  occluded just beyond the bifurcation without significant reconstitution in the neck. Left carotid system: The left common carotid artery is within normal limits. Atherosclerotic calcifications are present at the bifurcation without a significant stenosis relative to the more distal vessel. Minimal calcification is present just below the skull base. Vertebral arteries: The left vertebral artery is dominant. Both vertebral arteries originate from the subclavian arteries without significant stenosis. Skeleton: Vertebral body heights alignment are maintained. No focal lytic or blastic lesions are present. Large dental caries is present in the first left maxillary molar with marked periapical lucency. Large dental caries is present in the second right maxillary premolar. Maxillary teeth are absent. Other neck: Soft tissues the neck are otherwise unremarkable. No focal mucosal or submucosal lesion is present. Salivary glands are normal. Thyroid is normal. Trachea is unremarkable. Upper chest: Diffuse ground-glass attenuation is consistent with atelectasis or edema. No significant airspace consolidation is present. The thoracic inlet is within normal limits. Review of the MIP images confirms the above findings CTA HEAD FINDINGS Anterior circulation: Right internal carotid artery is occluded without reconstitution in the cavernous segment. There is slight retrograde flow from a patent anterior communicating artery. Both A2 segments fill from the left. No significant flow is present in the right M1 segment. There is some collateral flow into distal right MCA branch vessels. Left A1 and M1 segments are normal. Left MCA bifurcation is intact. There is moderate attenuation of MCA branch vessels without a significant proximal stenosis or occlusion. ACA branch vessels are within normal limits. Posterior circulation: The left vertebral artery is the dominant vessel. PICA origins are visualized and normal. Basilar artery is small.  A fetal type left posterior cerebral artery is present. The right posterior cerebral artery originates from the basilar tip. There is moderate attenuation of distal PCA branch vessels without a significant proximal stenosis or occlusion. Venous sinuses: Dural sinuses are patent. Straight sinus and deep cerebral veins are intact. Cortical veins are unremarkable. Anatomic variants: Fetal type left posterior cerebral artery. Delayed phase: Not performed in the emergent setting. Review of the MIP images confirms the above findings IMPRESSION: 1. 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 2. Retrograde flow into the right A1 segment via a patent anterior communicating artery. There is slight opacification into the ICA terminus. 3. Some right MCA branch vessels are reconstituted via pial collaterals. 4. Moderate proximal narrowing in the left M2 branch vessels without focal occlusion. 5. Mild distal PCA branch vessel irregularity without a significant posterior circulation stenosis or occlusion. 6. Bilateral maxillary dental disease. 7. Edema or atelectasis throughout the lung apices. Electronically Signed   By: Marin Roberts M.D.   On: 10/05/2017 15:41   Ct Angio Neck W And/or Wo Contrast  Result Date: 10/05/2017 CLINICAL DATA:  Code stroke. Left-sided weakness. Right-sided gaze. Slurred speech. EXAM: CT ANGIOGRAPHY HEAD AND NECK TECHNIQUE: Multidetector CT imaging of the head and neck was performed using the standard protocol during bolus administration of intravenous contrast. Multiplanar CT image reconstructions and MIPs were obtained to evaluate the vascular anatomy. Carotid stenosis measurements (when applicable) are obtained utilizing NASCET criteria, using the distal internal carotid diameter as the denominator. CONTRAST:  50mL ISOVUE-370 IOPAMIDOL (ISOVUE-370) INJECTION 76% COMPARISON:  CT head without contrast from the same day. FINDINGS: CTA NECK FINDINGS  Aortic  arch: A 3 vessel arch configuration is present. No significant stenosis or aneurysm is present. Right carotid system: The right common carotid artery is within normal limits. Atherosclerotic changes are present at the bifurcation. The right internal carotid artery is occluded just beyond the bifurcation without significant reconstitution in the neck. Left carotid system: The left common carotid artery is within normal limits. Atherosclerotic calcifications are present at the bifurcation without a significant stenosis relative to the more distal vessel. Minimal calcification is present just below the skull base. Vertebral arteries: The left vertebral artery is dominant. Both vertebral arteries originate from the subclavian arteries without significant stenosis. Skeleton: Vertebral body heights alignment are maintained. No focal lytic or blastic lesions are present. Large dental caries is present in the first left maxillary molar with marked periapical lucency. Large dental caries is present in the second right maxillary premolar. Maxillary teeth are absent. Other neck: Soft tissues the neck are otherwise unremarkable. No focal mucosal or submucosal lesion is present. Salivary glands are normal. Thyroid is normal. Trachea is unremarkable. Upper chest: Diffuse ground-glass attenuation is consistent with atelectasis or edema. No significant airspace consolidation is present. The thoracic inlet is within normal limits. Review of the MIP images confirms the above findings CTA HEAD FINDINGS Anterior circulation: Right internal carotid artery is occluded without reconstitution in the cavernous segment. There is slight retrograde flow from a patent anterior communicating artery. Both A2 segments fill from the left. No significant flow is present in the right M1 segment. There is some collateral flow into distal right MCA branch vessels. Left A1 and M1 segments are normal. Left MCA bifurcation is intact. There is  moderate attenuation of MCA branch vessels without a significant proximal stenosis or occlusion. ACA branch vessels are within normal limits. Posterior circulation: The left vertebral artery is the dominant vessel. PICA origins are visualized and normal. Basilar artery is small. A fetal type left posterior cerebral artery is present. The right posterior cerebral artery originates from the basilar tip. There is moderate attenuation of distal PCA branch vessels without a significant proximal stenosis or occlusion. Venous sinuses: Dural sinuses are patent. Straight sinus and deep cerebral veins are intact. Cortical veins are unremarkable. Anatomic variants: Fetal type left posterior cerebral artery. Delayed phase: Not performed in the emergent setting. Review of the MIP images confirms the above findings IMPRESSION: 1. 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 2. Retrograde flow into the right A1 segment via a patent anterior communicating artery. There is slight opacification into the ICA terminus. 3. Some right MCA branch vessels are reconstituted via pial collaterals. 4. Moderate proximal narrowing in the left M2 branch vessels without focal occlusion. 5. Mild distal PCA branch vessel irregularity without a significant posterior circulation stenosis or occlusion. 6. Bilateral maxillary dental disease. 7. Edema or atelectasis throughout the lung apices. Electronically Signed   By: Marin Roberts M.D.   On: 10/05/2017 15:41   Mr Brain Wo Contrast  Result Date: 10/06/2017 CLINICAL DATA:  Stroke follow-up. Status post tPA and endovascular revascularization of the right carotid and MCA. EXAM: MRI HEAD WITHOUT CONTRAST MRA HEAD WITHOUT CONTRAST TECHNIQUE: Multiplanar, multiecho pulse sequences of the brain and surrounding structures were obtained without intravenous contrast. Angiographic images of the head were obtained using MRA technique without contrast.  COMPARISON:  CTA head neck from yesterday FINDINGS: MRI HEAD FINDINGS Brain: Restricted diffusion in the right caudate and putamen. There are 2 punctate acute cortical infarcts in the right parietal  lobe. No indication of acute hemorrhage. Small remote left pontine infarct. Age normal brain volume. No hydrocephalus, masslike finding, or shift. Vascular: See below Skull and upper cervical spine: No evidence of marrow lesion Sinuses/Orbits: Clear MRA HEAD FINDINGS Severe motion degradation, most slices are nondiagnostic. There is good visualization of the bilateral M1 segments, which are widely patent. Hypoplastic/aplastic right A1 segment. Fetal type left PCA. IMPRESSION: Brain MRI: 1. Acute infarct in the right stratum. Two punctate acute infarcts in the right parietal cortex. 2. 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 3. Significantly motion degraded. Intracranial MRA: Severely motion degraded. There is diagnostic visualization of the bilateral M1 segments which are widely patent. Electronically Signed   By: Marnee SpringJonathon  Watts M.D.   On: 10/06/2017 14:40   Mr Maxine GlennMra Head Wo Contrast  Result Date: 10/06/2017 CLINICAL DATA:  Stroke follow-up. Status post tPA and endovascular revascularization of the right carotid and MCA. EXAM: MRI HEAD WITHOUT CONTRAST MRA HEAD WITHOUT CONTRAST TECHNIQUE: Multiplanar, multiecho pulse sequences of the brain and surrounding structures were obtained without intravenous contrast. Angiographic images of the head were obtained using MRA technique without contrast. COMPARISON:  CTA head neck from yesterday FINDINGS: MRI HEAD FINDINGS Brain: Restricted diffusion in the right caudate and putamen. There are 2 punctate acute cortical infarcts in the right parietal lobe. No indication of acute hemorrhage. Small remote left pontine infarct. Age normal brain volume. No hydrocephalus, masslike finding, or shift. Vascular: See below Skull and upper cervical spine: No evidence of marrow lesion  Sinuses/Orbits: Clear MRA HEAD FINDINGS Severe motion degradation, most slices are nondiagnostic. There is good visualization of the bilateral M1 segments, which are widely patent. Hypoplastic/aplastic right A1 segment. Fetal type left PCA. IMPRESSION: Brain MRI: 1. Acute infarct in the right stratum. Two punctate acute infarcts in the right parietal cortex. 2. 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 3. Significantly motion degraded. Intracranial MRA: Severely motion degraded. There is diagnostic visualization of the bilateral M1 segments which are widely patent. Electronically Signed   By: Marnee SpringJonathon  Watts M.D.   On: 10/06/2017 14:40   Ct Head Code Stroke Wo Contrast  Result Date: 10/05/2017 CLINICAL DATA:  Code stroke.  Focal neuro deficit EXAM: CT HEAD WITHOUT CONTRAST TECHNIQUE: Contiguous axial images were obtained from the base of the skull through the vertex without intravenous contrast. COMPARISON:  11/23/2006 FINDINGS: Brain: No evidence of acute infarction, hemorrhage, hydrocephalus, extra-axial collection or mass lesion/mass effect. Vascular: Hyperdense right M1 segment Skull: No acute finding. Sequela of dental disease seen at the maxilla on the right. Sinuses/Orbits: Gaze to the right, correlating with the vascular findings. Other: These results were called by telephone at the time of interpretation on 10/05/2017 at 3:10 pm to Dr. Roda ShuttersXu , who was already aware of the findings. CTA is pending as permitted by tPA administration coordination ASPECTS [facility]([place] Stroke Program Early CT Score) - Ganglionic level infarction (caudate, lentiform nuclei, internal capsule, insula, M1-M3 cortex): 7 - Supraganglionic infarction (M4-M6 cortex): 3 Total score (0-10 with 10 being normal): 10 IMPRESSION: Hyperdense right MCA consistent with thrombosis in this setting. No acute hemorrhage. Aspects is 10. Electronically Signed   By: Marnee SpringJonathon  Watts M.D.   On: 10/05/2017 15:13    Labs:  CBC: Recent Labs     10/05/17 1452 10/05/17 1458 10/06/17 0500 10/07/17 0708  WBC 6.8  --  8.5 7.2  HGB 13.0 13.6 11.0* 10.9*  HCT 37.7* 40.0 32.5* 31.3*  PLT 220  --  182 169  COAGS: Recent Labs    10/05/17 1452  INR 1.09  APTT 32    BMP: Recent Labs    05/23/17 1204 10/05/17 1452 10/05/17 1458 10/06/17 0500 10/07/17 0708  NA 141 138 139 139 139  K 4.6 3.8 3.8 3.5 3.7  CL 105 107 106 109 108  CO2 22 24  --  21* 24  GLUCOSE 102* 103* 98 107* 110*  BUN 10 11 12 7 6   CALCIUM 9.7 9.2  --  8.4* 8.5*  CREATININE 1.10 1.61* 1.60* 1.13 1.03  GFRNONAA 76 47*  --  >60 >60  GFRAA 88 54*  --  >60 >60    LIVER FUNCTION TESTS: Recent Labs    10/25/16 1522 05/23/17 1204 10/05/17 1452  BILITOT <0.2 0.5 0.6  AST 31 28 24   ALT 26 20 17   ALKPHOS 73 74 64  PROT 7.5 7.6 7.1  ALBUMIN 4.4 4.2 3.7    Assessment and Plan:  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 Corliss Skains. No focal neurological symptoms noted. Right groin incision site stable. Will order [id number] today. Continue taking Brilinta 90 mg twice daily and Aspirin 81 mg once daily. Appreciate and agree with neurology management. Please call IR with questions/concerns.   Electronically Signed: Elwin Mocha, PA-C 10/07/2017, 9:02 AM   I spent a total of 15 Minutes at the the patient's bedside AND on the patient's hospital floor or unit, greater than 50% of which was counseling/coordinating care for right MCA M1 segment AND right ICA proximal occlusions s/p revascularization.

## 2017-10-07 NOTE — Progress Notes (Signed)
PT Cancellation Note  Patient Details Name: Kevin RiasDarryl Delman MRN: [id number] DOB: 1960/12/14   Cancelled Treatment:    Reason Eval/Treat Not Completed: Active bedrest order; remains on bedrest, will attempt later.   Elray McgregorCynthia Wynn 10/07/2017, 9:43 AM  Sheran Lawlessyndi Wynn, PT [phone] 10/07/2017

## 2017-10-07 NOTE — Progress Notes (Addendum)
STROKE TEAM PROGRESS NOTE   SUBJECTIVE (INTERVAL HISTORY) His RN is at the bedside.  Pt has been doing well. No complains. Right groin clean. HR and BP improved. C-collar removed. Passed swallow. MRI showed right BG and caudate head infarct as well as punctate posterior frontal small infarcts, consistent with right ICA occlusion. MRA showed patent right MCA. CUS pending today.     OBJECTIVE Temp:  [97.6 F (36.4 C)-98.6 F (37 C)] 97.6 F (36.4 C) (08/12 0359) Pulse Rate:  [42-86] 47 (08/12 0700) Cardiac Rhythm: Normal sinus rhythm (08/12 0400) Resp:  [4-30] 12 (08/12 0700) BP: (75-128)/(39-97) 112/67 (08/12 0700) SpO2:  [94 %-100 %] 98 % (08/12 0700) Arterial Line BP: (71-140)/(31-77) 71/68 (08/11 1300)  CBC:  Recent Labs  Lab 10/05/17 1452 10/05/17 1458 10/06/17 0500  WBC 6.8  --  8.5  NEUTROABS 2.6  --  4.9  HGB 13.0 13.6 11.0*  HCT 37.7* 40.0 32.5*  MCV 89.8  --  91.0  PLT 220  --  182    Basic Metabolic Panel:  Recent Labs  Lab 10/05/17 1452 10/05/17 1458 10/06/17 0500  NA 138 139 139  K 3.8 3.8 3.5  CL 107 106 109  CO2 24  --  21*  GLUCOSE 103* 98 107*  BUN 11 12 7   CREATININE 1.61* 1.60* 1.13  CALCIUM 9.2  --  8.4*    Lipid Panel:     Component Value Date/Time   CHOL 193 10/06/2017 0500   CHOL 249 (H) 05/23/2017 1204   TRIG 111 10/06/2017 0500   HDL 59 10/06/2017 0500   HDL 82 05/23/2017 1204   CHOLHDL 3.3 10/06/2017 0500   VLDL 22 10/06/2017 0500   LDLCALC 112 (H) 10/06/2017 0500   LDLCALC 154 (H) 05/23/2017 1204   HgbA1c: No results found for: HGBA1C Urine Drug Screen:     Component Value Date/Time   LABOPIA NONE DETECTED 10/06/2017 1103   COCAINSCRNUR POSITIVE (A) 10/06/2017 1103   LABBENZ NONE DETECTED 10/06/2017 1103   AMPHETMU NONE DETECTED 10/06/2017 1103   THCU NONE DETECTED 10/06/2017 1103   LABBARB NONE DETECTED 10/06/2017 1103    Alcohol Level     Component Value Date/Time   ETH (H) 11/23/2006 0055    141        LOWEST  DETECTABLE LIMIT FOR SERUM ALCOHOL IS 11 mg/dL FOR MEDICAL PURPOSES ONLY    IMAGING I have personally reviewed the radiological images below and agree with the radiology interpretations.  Ct Angio Head W Or Wo Contrast Ct Angio Neck W And/or Wo Contrast 10/05/2017 IMPRESSION:  1. 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  2. Retrograde flow into the right A1 segment via a patent anterior communicating artery. There is slight opacification into the ICA terminus.  3. Some right MCA branch vessels are reconstituted via pial collaterals.  4. Moderate proximal narrowing in the left M2 branch vessels without focal occlusion.  5. Mild distal PCA branch vessel irregularity without a significant posterior circulation stenosis or occlusion. 6. Bilateral maxillary dental disease.  7. Edema or atelectasis throughout the lung apices.   Ct Head Code Stroke Wo Contrast 10/05/2017 IMPRESSION:  Hyperdense right MCA consistent with thrombosis in this setting. No acute hemorrhage. Aspects is 10.   Mr Brain Wo Contrast  Result Date: 10/06/2017 CLINICAL DATA:  Stroke follow-up. Status post tPA and endovascular revascularization of the right carotid and MCA. EXAM: MRI HEAD WITHOUT CONTRAST MRA HEAD WITHOUT CONTRAST TECHNIQUE: Multiplanar,  multiecho pulse sequences of the brain and surrounding structures were obtained without intravenous contrast. Angiographic images of the head were obtained using MRA technique without contrast. COMPARISON:  CTA head neck from yesterday FINDINGS: MRI HEAD FINDINGS Brain: Restricted diffusion in the right caudate and putamen. There are 2 punctate acute cortical infarcts in the right parietal lobe. No indication of acute hemorrhage. Small remote left pontine infarct. Age normal brain volume. No hydrocephalus, masslike finding, or shift. Vascular: See below Skull and upper cervical spine: No evidence of marrow lesion Sinuses/Orbits: Clear  MRA HEAD FINDINGS Severe motion degradation, most slices are nondiagnostic. There is good visualization of the bilateral M1 segments, which are widely patent. Hypoplastic/aplastic right A1 segment. Fetal type left PCA. IMPRESSION: Brain MRI: 1. Acute infarct in the right stratum. Two punctate acute infarcts in the right parietal cortex. 2. 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 3. Significantly motion degraded. Intracranial MRA: Severely motion degraded. There is diagnostic visualization of the bilateral M1 segments which are widely patent. Electronically Signed   By: Marnee SpringJonathon  Watts M.D.   On: 10/06/2017 14:40   Mr Maxine GlennMra Head Wo Contrast  Result Date: 10/06/2017 CLINICAL DATA:  Stroke follow-up. Status post tPA and endovascular revascularization of the right carotid and MCA. EXAM: MRI HEAD WITHOUT CONTRAST MRA HEAD WITHOUT CONTRAST TECHNIQUE: Multiplanar, multiecho pulse sequences of the brain and surrounding structures were obtained without intravenous contrast. Angiographic images of the head were obtained using MRA technique without contrast. COMPARISON:  CTA head neck from yesterday FINDINGS: MRI HEAD FINDINGS Brain: Restricted diffusion in the right caudate and putamen. There are 2 punctate acute cortical infarcts in the right parietal lobe. No indication of acute hemorrhage. Small remote left pontine infarct. Age normal brain volume. No hydrocephalus, masslike finding, or shift. Vascular: See below Skull and upper cervical spine: No evidence of marrow lesion Sinuses/Orbits: Clear MRA HEAD FINDINGS Severe motion degradation, most slices are nondiagnostic. There is good visualization of the bilateral M1 segments, which are widely patent. Hypoplastic/aplastic right A1 segment. Fetal type left PCA. IMPRESSION: Brain MRI: 1. Acute infarct in the right stratum. Two punctate acute infarcts in the right parietal cortex. 2. 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 3. Significantly motion degraded. Intracranial  MRA: Severely motion degraded. There is diagnostic visualization of the bilateral M1 segments which are widely patent. Electronically Signed   By: Marnee SpringJonathon  Watts M.D.   On: 10/06/2017 14:40   DSA 10/05/2017 S/P bilateral common carotid arteriogram,RT Vert angiogram followed by complete revascularization of RT MCA M1 occlusion with x 1 pass with 5mm x 33 mm embotrap device achieving a TICI 3 reperfusion and revascularization of acutely occluded RT ICA prox with stent assisted angioplasty.   Transthoracic Echocardiogram - Left ventricle: The cavity size was normal. Wall thickness was   increased in a pattern of moderate LVH. Systolic function was   vigorous. The estimated ejection fraction was in the range of 65%   to 70%. Wall motion was normal; there were no regional wall   motion abnormalities. Features are consistent with a pseudonormal   left ventricular filling pattern, with concomitant abnormal   relaxation and increased filling pressure (grade 2 diastolic   dysfunction). - Aortic valve: There was no stenosis. - Mitral valve: Mildly calcified annulus. Mildly calcified leaflets   . There was trivial regurgitation. - Right ventricle: The cavity size was normal. Systolic function   was normal. - Pulmonary arteries: No complete TR doppler jet so unable to   estimate PA  systolic pressure. - Inferior vena cava: The vessel was normal in size. The   respirophasic diameter changes were in the normal range (>= 50%),   consistent with normal central venous pressure. Impressions: - Normal LV size with moderate LV hypertrophy. EF 60-65%. Moderate   diastolic dysfunction. Normal RV size and systolic function.  CUS pending   PHYSICAL EXAM  Temp:  [97.6 F (36.4 C)-98.6 F (37 C)] 97.6 F (36.4 C) (08/12 0359) Pulse Rate:  [42-86] 47 (08/12 0700) Resp:  [4-30] 12 (08/12 0700) BP: (75-128)/(39-97) 112/67 (08/12 0700) SpO2:  [94 %-100 %] 98 % (08/12 0700) Arterial Line BP:  (71-140)/(31-77) 71/68 (08/11 1300)  General - Well nourished, well developed, in no apparent distress.  Ophthalmologic - fundi not visualized due to noncooperation.  Cardiovascular - Regular rhythm but tachycardia.  Mental Status -  Level of arousal and orientation to time, place, and person were intact. Language including expression, naming, repetition, comprehension was assessed and found intact. Fund of Knowledge was assessed and was intact.  Cranial Nerves II - XII - II - Visual field intact OU. III, IV, VI - Extraocular movements intact. V - Facial sensation intact bilaterally. VII - Facial movement intact bilaterally. VIII - Hearing & vestibular intact bilaterally. X - Palate elevates symmetrically. XI - Chin turning & shoulder shrug intact bilaterally. XII - Tongue protrusion intact.  Motor Strength - The patient's strength was normal in all extremities and pronator drift was absent.  Bulk was normal and fasciculations were absent.   Motor Tone - Muscle tone was assessed at the neck and appendages and was normal.  Reflexes - The patient's reflexes were symmetrical in all extremities and he had no pathological reflexes.  Sensory - Light touch, temperature/pinprick were assessed and were symmetrical.    Coordination - The patient had normal movements in the hands with no ataxia or dysmetria.  Tremor was absent.  Gait and Station - deferred.   ASSESSMENT/PLAN Mr. [name] is a 57 y.o. male with history of HTN, CAD, NSTEMI ( 06/2016), tobacco use, and substance abuse presenting with slurred speech, right gaze, left side flaccid, and neglecting left side. IV TPA Saturday August 10th bolus started at 1513. Mechanical thrombectomy Rt MCA TICI3 with stent assisted angioplasty of Rt ICA.  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  CTA H&N - Rt ICA occluded from the bifurcation through the ICA terminus. Right M1 occlusion with some pial collateral flow distally  IR - right M1 occlusion s/p IR with TICI3 and right ICA occlusion s/p stenting  MRI head - Acute infarct in the right stratum. Two punctate acute infarcts in the right parietal cortex. 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  MRA head - patent b/l MCAs  2D Echo - EF 60-65%  Carotid doppler pending  UDS positive for cocaine  LDL - 112  HgbA1c - pending  HIV neg  RPR - pending  P2Y12 pending  VTE prophylaxis - SCDs  Diet NPO for now  aspirin 81 mg daily prior to admission, now on aspirin 81 mg daily and Brilinta 90 mg BID.  Patient counseled to be compliant with his antithrombotic medications  Ongoing aggressive stroke risk factor management  Therapy  recommendations:  pending  Disposition:  Pending  Extra- and intracranial stenosis  CTA head and neck showed right ICA and MCA occlusion, left ICA bifurcation athero, left M2 proximal moderate narrowing  Significant risk factor with smoking, alcohol and cocaine use  S/p right ICA stenting  On ASA and brilinta  Hypertension/now hypotension  BP low . BP goal 100-140 . Currently low BP likely due to carotid stenting  Bradycardia  HR 40s->50s  Likely due to carotid stenting  Tele monitoring closely  Atropine if needed  Hyperlipidemia  Lipid lowering medication PTA:  Zocor 40 mg daily  LDL 112, goal < 70  Current lipid lowering medication: Lipitor 80 mg daily  Continue statin at discharge  Tobacco abuse  Current heavy smoker  Smoking cessation counseling provided  Pt is willing to quit  Alcohol use  Last use Friday - 2 beers  On CIWA protocol  On FA/B1/MVI  Education provided on limited alcohol use  Cocaine use  Hx of  cocaine use and positive on UDS  Admitted last use last week  UDS again positive for cocaine  Cocaine cessation education provided  Other Stroke Risk Factors  Obesity, Body mass index is 32.66 kg/m., recommend weight loss, diet and exercise as appropriate   Coronary artery disease 07/2016 s/p stenting - on ASA and zocor at home - following with cardiology  Hx of stroke by imaging - left pontine   Other Active Problems  Mild anemia - due to acute blood loss from procedure  Hospital day # 2  This patient is critically ill due to stroke, carotid occlusion, s/p stenting, hypotension and bradycardia, alcohol use and at significant risk of neurological worsening, death form recurrent stroke, stent reocclusion, cardiac arrest, DT, cerebral edema and hemorrhagic conversion. This patient's care requires constant monitoring of vital signs, hemodynamics, respiratory and cardiac monitoring, review of multiple databases, neurological assessment, discussion with family, other specialists and medical decision making of high complexity. I spent 35 minutes of neurocritical care time in the care of this patient.  Marvel Plan, MD PhD Stroke Neurology 10/07/2017 10:13 AM    To contact Stroke Continuity provider, please refer to [email or web address]. After hours, contact General Neurology

## 2017-10-08 ENCOUNTER — Other Ambulatory Visit: Payer: Self-pay

## 2017-10-08 DIAGNOSIS — I63231 Cerebral infarction due to unspecified occlusion or stenosis of right carotid arteries: Secondary | ICD-10-CM

## 2017-10-08 DIAGNOSIS — Z720 Tobacco use: Secondary | ICD-10-CM

## 2017-10-08 DIAGNOSIS — D62 Acute posthemorrhagic anemia: Secondary | ICD-10-CM | POA: Diagnosis not present

## 2017-10-08 DIAGNOSIS — Z789 Other specified health status: Secondary | ICD-10-CM

## 2017-10-08 DIAGNOSIS — I63511 Cerebral infarction due to unspecified occlusion or stenosis of right middle cerebral artery: Principal | ICD-10-CM

## 2017-10-08 DIAGNOSIS — R001 Bradycardia, unspecified: Secondary | ICD-10-CM | POA: Diagnosis not present

## 2017-10-08 DIAGNOSIS — I63239 Cerebral infarction due to unspecified occlusion or stenosis of unspecified carotid arteries: Secondary | ICD-10-CM | POA: Diagnosis present

## 2017-10-08 DIAGNOSIS — E669 Obesity, unspecified: Secondary | ICD-10-CM

## 2017-10-08 DIAGNOSIS — F1490 Cocaine use, unspecified, uncomplicated: Secondary | ICD-10-CM

## 2017-10-08 DIAGNOSIS — I672 Cerebral atherosclerosis: Secondary | ICD-10-CM

## 2017-10-08 DIAGNOSIS — Z7289 Other problems related to lifestyle: Secondary | ICD-10-CM | POA: Diagnosis present

## 2017-10-08 LAB — CBC
HEMATOCRIT: 33.2 % — AB (ref 39.0–52.0)
Hemoglobin: 11.3 g/dL — ABNORMAL LOW (ref 13.0–17.0)
MCH: 30.6 pg (ref 26.0–34.0)
MCHC: 34 g/dL (ref 30.0–36.0)
MCV: 90 fL (ref 78.0–100.0)
Platelets: 180 10*3/uL (ref 150–400)
RBC: 3.69 MIL/uL — AB (ref 4.22–5.81)
RDW: 14.2 % (ref 11.5–15.5)
WBC: 7.5 10*3/uL (ref 4.0–10.5)

## 2017-10-08 LAB — BASIC METABOLIC PANEL
ANION GAP: 9 (ref 5–15)
BUN: 6 mg/dL (ref 6–20)
CO2: 25 mmol/L (ref 22–32)
Calcium: 9.1 mg/dL (ref 8.9–10.3)
Chloride: 106 mmol/L (ref 98–111)
Creatinine, Ser: 1.09 mg/dL (ref 0.61–1.24)
GFR calc Af Amer: >60 mL/min (ref >60)
GLUCOSE: 113 mg/dL — AB (ref 70–99)
POTASSIUM: 3.5 mmol/L (ref 3.5–5.1)
Sodium: 140 mmol/L (ref 135–145)

## 2017-10-08 LAB — RPR: RPR: NONREACTIVE

## 2017-10-08 MED ORDER — ATORVASTATIN CALCIUM 80 MG PO TABS: 80.0000 mg | ORAL_TABLET | Freq: Every day | ORAL | 2 refills | Status: DC

## 2017-10-08 MED ORDER — TICAGRELOR 90 MG PO TABS: 90.0000 mg | ORAL_TABLET | Freq: Two times a day (BID) | ORAL | 2 refills | Status: DC

## 2017-10-08 NOTE — Evaluation (Signed)
Occupational Therapy Evaluation Patient Details Name: Kevin Barton MRN: [id number] DOB: 1961/01/21 Today's Date: 10/08/2017    History of Present Illness Chin Kevin Barton is an 57 y.o. male with PMH significant for HTN, CAD, NSTEMI ( 06/2016) presents to ED with suddent onset of slurred speech, right gaze, left side flaccid, and neglecting left side.  MRI showed right BG and caudate head infarct as well as punctate posterior frontal small infarcts, consistent with right ICA occlusion.  He is s/p TPA and Mechanical thrombectomy Rt MCA TICI3 with stent assisted angioplasty of Rt ICA.   Clinical Impression   Pt reports feeling close to his baseline and reports having knee problems that cause issue with ambulation prior to admission. Pt ambulating with RW and close supervision in room and donning LB clothing items with sit <>stand from bed level with min guard for balance and safety. Pt having posterior LOB when standing from toilet but able to self correct. Pt reports having intermittent supervision at discharge if needed. Pt would benefit from acute OT intervention to address functional deficits.     Follow Up Recommendations  No OT follow up    Equipment Recommendations  None recommended by OT    Recommendations for Other Services       Precautions / Restrictions Precautions Precautions: Fall Restrictions Weight Bearing Restrictions: No      Mobility Bed Mobility Overal bed mobility: Modified Independent    Transfers Overall transfer level: Needs assistance   Transfers: Sit to/from Stand Sit to Stand: Min guard         General transfer comment: for safety/balance    Balance Overall balance assessment: Needs assistance Sitting-balance support: Feet supported;No upper extremity supported Sitting balance-Leahy Scale: Good     Standing balance support: During functional activity Standing balance-Leahy Scale: Fair Standing balance comment: standing static no UE support but  reliance of RW for safety         ADL either performed or assessed with clinical judgement   ADL Overall ADL's : Needs assistance/impaired     General ADL Comments: steady assistance overall with dynamic standing balance for self care     Vision Patient Visual Report: No change from baseline              Pertinent Vitals/Pain Pain Assessment: No/denies pain Pain Score: 0-No pain Faces Pain Scale: No hurt     Hand Dominance Right   Extremity/Trunk Assessment Upper Extremity Assessment Upper Extremity Assessment: Overall WFL for tasks assessed   Lower Extremity Assessment Lower Extremity Assessment: Overall WFL for tasks assessed       Communication Communication Communication: No difficulties   Cognition Arousal/Alertness: Awake/alert Behavior During Therapy: WFL for tasks assessed/performed Overall Cognitive Status: Within Functional Limits for tasks assessed                 Home Living Family/patient expects to be discharged to:: Private residence Living Arrangements: Alone Available Help at Discharge: Family;Friend(s) Type of Home: Other(Comment) Home Access: Elevator     Home Layout: One level     Bathroom Shower/Tub: Tub/shower unit         Home Equipment: Environmental consultantWalker - standard          Prior Functioning/Environment Level of Independence: Independent        Comments: limping for awhile on a bad R knee        OT Problem List: Decreased strength;Impaired balance (sitting and/or standing);Decreased cognition;Decreased knowledge of precautions;Decreased safety awareness;Decreased activity tolerance  OT Treatment/Interventions: Self-care/ADL training;Manual therapy;Therapeutic exercise;Patient/family education;Neuromuscular education;Balance training;Energy conservation;Therapeutic activities;DME and/or AE instruction    OT Goals(Current goals can be found in the care plan section) Acute Rehab OT Goals Patient Stated Goal: to return  to independent OT Goal Formulation: With patient Time For Goal Achievement: 10/22/17 Potential to Achieve Goals: Good ADL Goals Pt Will Perform Lower Body Bathing: with modified independence Pt Will Perform Lower Body Dressing: with modified independence Pt Will Transfer to Toilet: with modified independence Pt Will Perform Toileting - Clothing Manipulation and hygiene: with modified independence Pt Will Perform Tub/Shower Transfer: with modified independence  OT Frequency: Min 2X/week   Barriers to D/C: Other (comment)  none known at this time          AM-PAC PT "6 Clicks" Daily Activity     Outcome Measure Help from another person eating meals?: None Help from another person taking care of personal grooming?: None Help from another person toileting, which includes using toliet, bedpan, or urinal?: A Little Help from another person bathing (including washing, rinsing, drying)?: A Little Help from another person to put on and taking off regular upper body clothing?: None Help from another person to put on and taking off regular lower body clothing?: A Little 6 Click Score: 21   End of Session Equipment Utilized During Treatment: Rolling walker  Activity Tolerance: Patient tolerated treatment well Patient left: in bed;with call bell/phone within reach  OT Visit Diagnosis: Unsteadiness on feet (R26.81);Muscle weakness (generalized) (M62.81)                Time: [id number] OT Time Calculation (min): 16 min Charges:  OT General Charges $OT Visit: 1 Visit OT Evaluation $OT Eval Low Complexity: 1 Low   Prescott Truex P, MS, OTR/L 10/08/2017, 1:08 PM

## 2017-10-08 NOTE — Progress Notes (Signed)
Pt being discharged from hospital per orders from MD. Pt educated on discharge instructions. Pt verbalized understanding of instructions. All questions and concerns were addressed. Pt's IV was removed prior to discharge. Pt exited hospital via wheelchair accompanied by staff. 

## 2017-10-08 NOTE — Discharge Summary (Addendum)
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 Arthritis   . CAD (coronary artery disease)    cath 06/2016 occlusion of RCA with collateralization from the left coronary artery. Mild nonobstructive plaque in the left main, LAD, and LCx. LVEF 50%. TX Rx.   . Chronic back pain   . Chronic knee pain   . HTN (hypertension)   . NSTEMI (non-ST elevated myocardial infarction) (HCC)   . Tobacco abuse    Past Surgical History:  Procedure Laterality Date  . LEFT HEART CATH AND CORONARY ANGIOGRAPHY N/A 07/25/2016   Procedure: Left Heart Cath and Coronary Angiography;  Surgeon: Tonny Bollman, MD;  Location: [facility] INVASIVE CV LAB;  Service: Cardiovascular;  Laterality: N/A;  . RADIOLOGY WITH ANESTHESIA N/A 10/05/2017   Procedure: IR WITH ANESTHESIA CODE STROKE;  Surgeon: Julieanne Cotton, MD;  Location: [facility] OR;  Service: Radiology;  Laterality: N/A;    Allergies as of 10/08/2017   No Known Allergies     Medication List    STOP taking these medications   ibuprofen 800 MG tablet Commonly known as:  ADVIL,MOTRIN   isosorbide mononitrate 30 MG 24 hr tablet Commonly known as:  IMDUR    lisinopril-hydrochlorothiazide 20-25 MG tablet Commonly known as:  PRINZIDE,ZESTORETIC   nicotine 14 mg/24hr patch Commonly known as:  NICODERM CQ - dosed in mg/24 hours   nitroGLYCERIN 0.4 MG SL tablet Commonly known as:  NITROSTAT   simvastatin 40 MG tablet Commonly known as:  ZOCOR     TAKE these medications   aspirin EC 81 MG tablet Take 81 mg by mouth daily.   atorvastatin 80 MG tablet Commonly known as:  LIPITOR Take 1 tablet (80 mg total) by mouth daily at 6 PM.   ticagrelor 90 MG Tabs tablet Commonly known as:  BRILINTA Take 1 tablet (90 mg total) by mouth 2 (two) times daily.            Durable Medical Equipment  (From admission, onward)         Start     Ordered   10/08/17 1057  For home use only DME Walker rolling  Once    Question:  Patient needs a walker to treat with the following condition  Answer:  Stroke (HCC)   10/08/17 1056          LABORATORY STUDIES CBC    Component Value Date/Time   WBC 7.5 10/08/2017 0328   RBC 3.69 (L) 10/08/2017 0328   HGB 11.3 (L) 10/08/2017 0328   HCT 33.2 (L) 10/08/2017 0328   PLT 180 10/08/2017 0328   MCV 90.0 10/08/2017 0328   MCH 30.6  10/08/2017 0328   MCHC 34.0 10/08/2017 0328   RDW 14.2 10/08/2017 0328   LYMPHSABS 1.9 10/06/2017 0500   MONOABS 1.3 (H) 10/06/2017 0500   EOSABS 0.3 10/06/2017 0500   BASOSABS 0.0 10/06/2017 0500   CMP    Component Value Date/Time   NA 140 10/08/2017 0328   NA 141 05/23/2017 1204   K 3.5 10/08/2017 0328   CL 106 10/08/2017 0328   CO2 25 10/08/2017 0328   GLUCOSE 113 (H) 10/08/2017 0328   BUN 6 10/08/2017 0328   BUN 10 05/23/2017 1204   CREATININE 1.09 10/08/2017 0328   CALCIUM 9.1 10/08/2017 0328   PROT 7.1 10/05/2017 1452   PROT 7.6 05/23/2017 1204   ALBUMIN 3.7 10/05/2017 1452   ALBUMIN 4.2 05/23/2017 1204   AST 24 10/05/2017 1452   ALT 17 10/05/2017 1452   ALKPHOS 64 10/05/2017 1452   BILITOT 0.6 10/05/2017 1452   BILITOT 0.5 05/23/2017 1204    GFRNONAA >60 10/08/2017 0328   GFRAA >60 10/08/2017 0328   COAGS Lab Results  Component Value Date   INR 1.09 10/05/2017   INR 1.01 07/25/2016   Lipid Panel    Component Value Date/Time   CHOL 193 10/06/2017 0500   CHOL 249 (H) 05/23/2017 1204   TRIG 111 10/06/2017 0500   HDL 59 10/06/2017 0500   HDL 82 05/23/2017 1204   CHOLHDL 3.3 10/06/2017 0500   VLDL 22 10/06/2017 0500   LDLCALC 112 (H) 10/06/2017 0500   LDLCALC 154 (H) 05/23/2017 1204   HgbA1C  Lab Results  Component Value Date   HGBA1C 5.5 10/06/2017   Urinalysis No results found for: COLORURINE, APPEARANCEUR, LABSPEC, PHURINE, GLUCOSEU, HGBUR, BILIRUBINUR, KETONESUR, PROTEINUR, UROBILINOGEN, NITRITE, LEUKOCYTESUR Urine Drug Screen     Component Value Date/Time   LABOPIA NONE DETECTED 10/06/2017 1103   COCAINSCRNUR POSITIVE (A) 10/06/2017 1103   LABBENZ NONE DETECTED 10/06/2017 1103   AMPHETMU NONE DETECTED 10/06/2017 1103   THCU NONE DETECTED 10/06/2017 1103   LABBARB NONE DETECTED 10/06/2017 1103    Alcohol Level    Component Value Date/Time   ETH (H) 11/23/2006 0055    141        LOWEST DETECTABLE LIMIT FOR SERUM ALCOHOL IS 11 mg/dL FOR MEDICAL PURPOSES ONLY     SIGNIFICANT DIAGNOSTIC STUDIES Ct Head Code Stroke Wo Contrast 10/05/2017 IMPRESSION:  Hyperdense right MCA consistent with thrombosis in this setting. No acute hemorrhage. Aspects is 10.   Ct Angio Head W Or Wo Contrast Ct Angio Neck W And/or Wo Contrast 10/05/2017 IMPRESSION:  1. 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  2. Retrograde flow into the right A1 segment via a patent anterior communicating artery. There is slight opacification into the ICA terminus.  3. Some right MCA branch vessels are reconstituted via pial collaterals.  4. Moderate proximal narrowing in the left M2 branch vessels without focal occlusion.  5. Mild distal PCA branch vessel irregularity without a significant  posterior circulation stenosis or occlusion. 6. Bilateral maxillary dental disease.  7. Edema or atelectasis throughout the lung apices.   Mr Brain Wo Contrast 10/06/2017 1. Acute infarct in the right stratum. Two punctate acute infarcts in the right parietal cortex. 2. 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 3. Significantly motion degraded.   Mr Maxine Glenn Head Wo Contrast 10/06/2017 Severely motion degraded. There is diagnostic visualization of the bilateral M1 segments which are widely patent.   DSA 10/05/2017 S/P bilateral common carotid arteriogram,RT  Vert angiogram followed by complete revascularization of RT MCA M1 occlusion with x 1 pass with 5mm x 33 mm embotrap device achieving a TICI 3 reperfusion and revascularization of acutely occluded RT ICA prox with stent assisted angioplasty.  Transthoracic Echocardiogram - Left ventricle: The cavity size was normal. Wall thickness wasincreased in a pattern of moderate LVH. Systolic function wasvigorous. The estimated ejection fraction was in the range of 65%to 70%. Wall motion was normal; there were no regional wallmotion abnormalities. Features are consistent with a pseudonormalleft ventricular filling pattern, with concomitant abnormalrelaxation and increased filling pressure (grade 2 diastolicdysfunction). - Aortic valve: There was no stenosis. - Mitral valve: Mildly calcified annulus. Mildly calcified leaflets. There was trivial regurgitation. - Right ventricle: The cavity size was normal. Systolic function was normal. - Pulmonary arteries: No complete TR doppler jet so unable toestimate PA systolic pressure. - Inferior vena cava: The vessel was normal in size. Therespirophasic diameter changes were in the normal range (>= 50%),consistent with normal central venous pressure. Impressions:  Normal LV size with moderate LV hypertrophy. EF 60-65%. Moderatediastolic dysfunction. Normal RV size and systolic function.  Carotid  Doppler   Right ICA stent patent withless than 50% stenosis. Left CCA defused hypoechoic irregular plaque appears in mid CCA with intimal thickening, Left ICA velocity classified as 1-39% according to the category of velocity. Bilateral vertebral arteries patent with antegrade flow.      HISTORY OF PRESENT ILLNESS Kevin Stacksis an 57 y.o.malewith PMH significant for HTN, CAD, NSTEMI ( 06/2016) presents to ED with suddent onset of slurred speech, right gaze, left side flaccid, and neglecting left side. Code stroke was initiated by EMS. LVO =4.  Patient stated that he woke up this morning 10/05/2017 in his usual state of health. Has been sitting outside most of the day helping his brother at cookout on [address]. At 1415 his brother noticed that his face was drooping and he was"different". EMS was called and code stroke activated. Patient does not take daily ASA of 81 mgnot prescribed any blood thinners. Patient is a smoker.  BP 140/90 at arrival, then elevated to 190's. Treated with labetalol. Administered TPA. CT head shows Hyperdense right MCA. CTA confirms Right internal carotid artery occluded from the bifurcation through the ICA terminus. Taken to IR for mechanical thrombectomy.    HOSPITAL COURSE [name] is a 57 y.o. male with history of HTN, CAD, NSTEMI (06/2016), tobacco use, and substance abuse presenting with slurred speech, right gaze, left side flaccid, and neglecting left side. S/p IV TPA 10/05/2017 at 1513 followed by mechanical thrombectomy Rt MCA with TICI3 revascularization with stent assisted angioplasty of Rt ICA. Infarct felt to be secondary to large vessel disease. Placed on aspirin and Brilinta post stent. Identified risk factors addressed below. Overall, good recovery without need for therapies at d/c.  Stroke: Rt MCA infarct due to right ICA and MCA occlusion s/p tPA and IR with TICI3 revascularization - infarct due to right ICA atherosclerosis (large  vessel disease) given multiple risk factors, although cardioembolic can not be completely ruled out  Resultant  Back to baseline  CT head - Hyperdense right MCA consistent with thrombosis in this setting.  CTA H&N - Rt ICA occluded from the bifurcation through the ICA terminus. Right M1 occlusion with some pial collateral flow distally  IR - right M1 occlusion s/p IR with TICI3 and right ICA occlusion s/p stenting  MRI head - Acute infarct in the right stratum. Two punctate acute infarcts in  the right parietal cortex. 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  MRA head - patent b/l MCAs  2D Echo - EF 60-65%  Carotid doppler R ICA stent < 50% stenosis, L 1-39%  UDS positive for cocaine  LDL - 112  HgbA1c 5.5  HIV/RPR neg  P2Y12 59 (good antiplatelet function)  aspirin 81 mg daily prior to admission, now on aspirin 81 mg daily and Brilinta 90 mg BID. Continue at d/c  Patient counseled to be compliant with his antithrombotic medications  Therapy recommendations:  No therapy needs  Disposition:  Return home  Extra- and intracranial stenosis  CTA head and neck showed right ICA and MCA occlusion, left ICA bifurcation athero, left M2 proximal moderate narrowing  Significant risk factor with smoking, alcohol and cocaine use  S/p right ICA stenting  Carotid doppler R ICA stent < 50% stenosis, L 1-39%  On ASA and brilinta  Hx Hypertension Now hypotension/normotension  BP 90-120s  At BP goal 100-140  Currently low BP likely due to carotid stenting  Bradycardia  HR 40s-60s  Likely due to carotid stenting  stabilized   Hyperlipidemia  Lipid lowering medication PTA:  Zocor 40 mg daily  LDL 112, goal < 70  Current lipid lowering medication: Lipitor 80 mg daily  Continue statin at discharge  Tobacco abuse  Current heavy smoker  Smoking cessation counseling provided  Pt is willing to quit  Alcohol use  Last use Friday - 2 beers  On CIWA  protocol  On FA/B1/MVI  Education provided on limited alcohol use  Willing to quit; understands increase stroke risk occurs after 2 drinks/day  Cocaine use  Hx of cocaine use and positive on UDS  Admitted last use last week  UDS again positive for cocaine  Cocaine cessation education provided and reinforced  Other Stroke Risk Factors  Obesity, Body mass index is 32.66 kg/m., recommend weight loss, diet and exercise as appropriate   Coronary artery disease 07/2016 s/p stenting - on ASA and zocor at home - following with cardiology  Hx of stroke by imaging - left pontine   Other Active Problems  Mild acute blood loss anemia - due to acute blood loss from procedure, stabilizing 11.3   DISCHARGE EXAM Blood pressure 117/65, pulse 61, temperature 98 F (36.7 C), temperature source Oral, resp. rate 18, height 5\' 4"  (1.626 m), weight 86.3 kg, SpO2 100 %. General - Well nourished, well developed, in no apparent distress. Cardiovascular - Regular rhythm   Mental Status -  Level of arousal and orientation to time, place, and person were intact. Language including expression, naming, repetition, comprehension was assessed and found intact. Fund of Knowledge was assessed and was intact.  Cranial Nerves II - XII - II - Visual field intact OU. III, IV, VI - Extraocular movements intact (OS abduction, congenital) V - Facial sensation intact bilaterally. VII - Facial movement intact bilaterally. VIII - Hearing & vestibular intact bilaterally. X - Palate elevates symmetrically. XI - Chin turning & shoulder shrug intact bilaterally. XII - Tongue protrusion intact.  Motor Strength - The patient's strength was normal in all extremities and pronator drift was absent.  Bulk was normal and fasciculations were absent.   Motor Tone - Muscle tone was assessed at the neck and appendages and was normal.  Reflexes - The patient's reflexes were symmetrical in all extremities and he  had no pathological reflexes.  Sensory - Light touch, temperature/pinprick were assessed and were symmetrical.    Coordination - The  patient had normal movements in the hands with no ataxia or dysmetria.  Tremor was absent.  Gait and Station - deferred.  Discharge Diet   Heart healthy thin liquids  DISCHARGE PLAN  Disposition:  Home  No therapy needs  aspirin 81 mg daily and Brilinta 90 mg bid for secondary stroke prevention.  Ongoing risk factor control by Primary Care Physician at time of discharge  Follow-up PCP in 2 weeks. Patient instructed to get one if he does not have.  Follow-up in Dr. Rae Roam in 4 weeks, call for an appointment.   Follow-up in [facility] in 4 weeks, office to schedule an appointment.   40 minutes were spent preparing discharge.  Annie Main, MSN, APRN, ANVP-BC, AGPCNP-BC Advanced Practice Stroke Nurse [facility] See Amion for Schedule & Pager information 10/08/2017 12:01 PM    ATTENDING NOTE: I reviewed above note and agree with the assessment and plan. I have made any additions or clarifications directly to the above note. Pt was seen and examined.   No acute event overnight.  Patient sitting in chair, ready to go home.  RPR negative.  P2Y12 within good range for antiplatelet.  Carotid Doppler done yesterday showed right ICA less than 50% stenosis.  Patient was educated again on quit smoking, quit cocaine, limit alcohol drinks.  He will follow with his PCP and stroke neurology clinic at [facility].  Also follow-up with Dr. Corliss Skains as outpatient.  Marvel Plan, MD PhD Stroke Neurology 10/08/2017 5:32 PM

## 2017-10-08 NOTE — Anesthesia Postprocedure Evaluation (Signed)
Anesthesia Post Note  Patient: Engineer, [name]  Procedure(s) Performed: IR WITH ANESTHESIA CODE STROKE (N/A )     Patient location during evaluation: PACU Anesthesia Type: General Level of consciousness: awake and patient cooperative Pain management: pain level controlled Vital Signs Assessment: post-procedure vital signs reviewed and stable Respiratory status: spontaneous breathing, nonlabored ventilation, respiratory function stable and patient connected to nasal cannula oxygen Cardiovascular status: blood pressure returned to baseline and stable Postop Assessment: no apparent nausea or vomiting Anesthetic complications: no    Last Vitals:  Vitals:   10/07/17 2315 10/08/17 0731  BP: 121/68 123/76  Pulse: (!) 58 61  Resp: 18 18  Temp: 36.8 C 36.8 C  SpO2: 99% 99%    Last Pain:  Vitals:   10/08/17 0731  TempSrc: Oral  PainSc:                  Mirayah Wren

## 2017-10-08 NOTE — Evaluation (Signed)
Speech Language Pathology Evaluation Patient Details Name: Kevin Barton MRN: [id number] DOB: [birth year] Today's Date: 10/08/2017 Time: [id number] SLP Time Calculation (min) (ACUTE ONLY): 22 min  Problem List:  Patient Active Problem List   Diagnosis Date Noted  . Cerebral embolism with cerebral infarction 10/05/2017  . Embolic stroke (HCC) 10/05/2017  . Middle cerebral artery embolism, right 10/05/2017  . CAD (coronary artery disease) 04/28/2017  . Essential (primary) hypertension 04/28/2017  . Tobacco abuse 04/28/2017  . Hyperlipidemia LDL goal <70 04/28/2017  . NSTEMI (non-ST elevated myocardial infarction) [facility](HCC)    Past Medical History:  Past Medical History:  Diagnosis Date  . Arthritis   . CAD (coronary artery disease)    cath 06/2016 occlusion of RCA with collateralization from the left coronary artery. Mild nonobstructive plaque in the left main, LAD, and LCx. LVEF 50%. TX Rx.   . Chronic back pain   . Chronic knee pain   . HTN (hypertension)   . NSTEMI (non-ST elevated myocardial infarction) (HCC)   . Tobacco abuse    Past Surgical History:  Past Surgical History:  Procedure Laterality Date  . LEFT HEART CATH AND CORONARY ANGIOGRAPHY N/A 07/25/2016   Procedure: Left Heart Cath and Coronary Angiography;  Surgeon: Tonny Bollmanooper, Michael, MD;  [facility];  Service: Cardiovascular;  Laterality: N/A;  . RADIOLOGY WITH ANESTHESIA N/A 10/05/2017   Procedure: IR WITH ANESTHESIA CODE STROKE;  Surgeon: Julieanne Cottoneveshwar, Sanjeev, MD;  Location: [facility] OR;  Service: Radiology;  Laterality: N/A;   HPI:  Kevin Barton is an 57 y.o. male with PMH significant for HTN, CAD, NSTEMI ( 06/2016) presents to ED with suddent onset of slurred speech, right gaze, left side flaccid, and neglecting left side.  MRI showed right BG and caudate head infarct as well as punctate posterior frontal small infarcts, consistent with right ICA occlusion.  He is s/p TPA and Mechanical thrombectomy Rt MCA TICI3  with stent assisted angioplasty of Rt ICA.   Assessment / Plan / Recommendation Clinical Impression  Pt presents with functional expressive and receptive language abilities.  Pt was able to participate in conversation without any difficulty.  Pt reports speech has returned to baseline. Pt's speech was clear and free from dysarthria.  Oral motor evaluation was WNL.  Cognitive abilities assessed using COGNISTAT.  Pt presents with mild cognitive deficits which he state are consistent with baseline. Areas of impariment include calculation and resoning (similarities). Mild impairment indicated on attention task; however, pt was able to participate in evaluation without any evidence of inattention and required no redirection.  Pt has no concerns about ability to manage finances and complete ADLs upon discharge.    Given clinical presentation and patient assertion that he has returned to baseline, no further ST is recommended at this time. If there is concern for new cognitive deficits, consider outpatient SLP or neuropsychology referral for assessment of higher level cognitive function.  All subtests are within the average range, except where otherwise indicated Orientation: 12/12 Attention: 5/8, Mild Impairment Comprehension: 6/6 Repetition: 12/12 Naming: 7/8 Construction: not assessed Memory: 9/12 Calculations: 1/4, Moderate Impairment Similarities: 4/8, Mild Impairment Judgment: 5/6    SLP Assessment  SLP Recommendation/Assessment: Patient does not need any further Speech Lanaguage Pathology Services SLP Visit Diagnosis: Cognitive communication deficit (R41.841)       SLP Evaluation Cognition  Overall Cognitive Status: Within Functional Limits for tasks assessed(some mild deficits, which pt report to be consistent with baseline) Arousal/Alertness: Awake/alert Orientation Level: Oriented X4 Attention: Focused;Sustained Focused  Attention: Appears intact Sustained Attention: Appears  intact Memory: Appears intact Executive Function: Reasoning Reasoning: Impaired(Mild, but pt reports at baseline)       Comprehension  Auditory Comprehension Overall Auditory Comprehension: Appears within functional limits for tasks assessed Commands: Within Functional Limits Conversation: Complex    Expression Expression Primary Mode of Expression: Verbal Verbal Expression Overall Verbal Expression: Appears within functional limits for tasks assessed Repetition: No impairment Naming: No impairment   Oral / Motor  Oral Motor/Sensory Function Overall Oral Motor/Sensory Function: Within functional limits Motor Speech Overall Motor Speech: Appears within functional limits for tasks assessed Respiration: Within functional limits Articulation: Within functional limitis Intelligibility: Intelligible   GO                    Katy Brickell E Jakaylah Schlafer, MA, CCC-SLP.  Pager [phone] 10/08/2017, 10:30 AM

## 2017-10-08 NOTE — Care Management Note (Signed)
Case Management Note  Patient Details  Name: Kevin Barton MRN: [id number] Date of Birth: Oct 11, 1960  Subjective/Objective:    Pt admitted with CVA. He is from home alone. Pt states his niece, [name], can provide assistance as needed.  Pt has Medicaid. He [facility] but he will call his CM through DSS and find out his PCP and call for a hospital f/u appointment.                 Action/Plan: No f/u per PT. Pt with orders for a walker. [name] and delivered the equipment to the room. Pt states he has transportation home.  Expected Discharge Date:  10/08/17               Expected Discharge Plan:  Home/Self Care  In-House Referral:     Discharge planning Services  CM Consult  Post Acute Care Choice:  Durable Medical Equipment Choice offered to:  Patient  DME Arranged:  Dan HumphreysWalker rolling DME Agency:  [facility]  [name]:     Status of Service:  Completed, signed off  If discussed at Long Length of Stay Meetings, dates discussed:    Additional Comments:  [name], RN 10/08/2017, 1:02 PM

## 2017-10-09 ENCOUNTER — Other Ambulatory Visit (HOSPITAL_COMMUNITY): Payer: Self-pay | Admitting: Interventional Radiology

## 2017-10-09 ENCOUNTER — Encounter (HOSPITAL_COMMUNITY): Payer: Self-pay | Admitting: Interventional Radiology

## 2017-10-09 DIAGNOSIS — I639 Cerebral infarction, unspecified: Secondary | ICD-10-CM

## 2017-10-15 ENCOUNTER — Other Ambulatory Visit: Payer: Self-pay

## 2017-10-15 ENCOUNTER — Encounter: Payer: Self-pay | Admitting: Cardiovascular Disease

## 2017-10-15 NOTE — Patient Outreach (Signed)
[facility] Care Management  10/15/2017  [name] 1960-12-11 [id number]     EMMI-Stroke(Not on APL) RED ON EMMI ALERT Day # 6 Date: 10/14/17 Red Alert Reason: "Smoked or been around smoke? Yes"   Outreach attempt # 1 to patient. No answer. RN CM left HIPAA compliant voicemail message along with contact info.        Plan: RN CM will make outreach attempt to patient within 3-4 business days, RN CM unable to send letter as patient not on APL.  [name], RN,BSN,CCM [facility] Care Management Telephonic Care Management Coordinator Direct Phone: [phone] Toll Free: [phone] Fax: [phone]

## 2017-10-15 NOTE — Patient Outreach (Signed)
[facility]) Care Management  10/15/2017  Kenric Steffensen [birth year] [id number]     EMMI-Stroke(Not on APL) RED ON EMMI ALERT Day # 6 Date: 10/14/17 Red Alert Reason: "Smoked or been around smoke? Yes"   Incoming call from patient returning RN CM call. Patient voices he is doing well since discharge. He denies any acute issues or concerns at this time. Reviewed and addressed red alert with patient. He has history of smoking and voices that he does continue to smoke. He is aware of risks and implications of smoking. Patient voices that he is trying to cut back some. RN CM confimred with patient that he has his meds. He denies any issues or concerns regarding them. Patient does not have a PCP. He voices he was given number to call for office accepting Medicaid patients. He has called and is awaiting a call back. No other RN CM needs or concerns at this time.       Plan: RN CM will follow up with patient within two weeks.   Antionette Fairyoshanda Thorne Wirz, RN,BSN,CCM [facility] Care Management Telephonic Care Management Coordinator Direct Phone: [phone] Toll Free: [phone] Fax: [phone]

## 2017-10-21 ENCOUNTER — Telehealth (HOSPITAL_COMMUNITY): Payer: Self-pay

## 2017-10-21 NOTE — Telephone Encounter (Signed)
Returned pt's call to reschedule consult, no answer, left vm. AW

## 2017-10-22 ENCOUNTER — Other Ambulatory Visit: Payer: Self-pay

## 2017-10-22 NOTE — Patient Outreach (Signed)
[facility]) Care Management  10/22/2017  Kevin Barton 1960/03/22 [id number]     EMMI-Stroke(non APL) RED ON EMMI ALERT Day # 13 Date: 10/21/17 Red Alert Reason: "Went to follow up appt? No"   Outreach attempt # 1 to patient.  No answer at predent. RN CM left HIPAA compliant voicemail message along with contact info.         Plan: RN CM will make outreach attempt to patient within 3-4 business days.    [name], RN,BSN,CCM [facility] Care Management Telephonic Care Management Coordinator Direct Phone: [phone] Toll Free: [phone] Fax: [phone]

## 2017-10-23 ENCOUNTER — Other Ambulatory Visit: Payer: Self-pay

## 2017-10-23 NOTE — Patient Outreach (Signed)
[facility]) Care Management  10/23/2017  Kevin Barton 07/26/1960 [id number]     EMMI-Stroke(non APL) RED ON EMMI ALERT Day # 13 Date: 10/21/17 Red Alert Reason: "Went to follow up appt? No"    Outreach attempt #2 to patient. Spoke with patient. He is pleased to report that he is doing and feeling "great." He denies any acute issues or concerns a this time. He voices that he followed up on finding him a PCP. [facility] get [facility] with new MD. He confirmed that he has transportation to appt. Patient states he has an appt on 11/08/17 at "the hospital" but unsure with whom. Per chart, patient scheduled to see interventional radiology dept. He states that he will need to reschedule that appt as he has to "work and cook" on that day. Patient voiced that he would call and reschedule appt. He denies any further RN CM needs or concerns at this time.     Plan: RN CM will close case at this time as no further interventions needed.   Antionette Fairyoshanda Reyan Helle, RN,BSN,CCM [facility] Care Management Telephonic Care Management Coordinator Direct Phone: [phone] Toll Free: [phone] Fax: [phone]

## 2017-10-29 ENCOUNTER — Ambulatory Visit: Payer: Self-pay

## 2017-11-04 ENCOUNTER — Ambulatory Visit: Payer: Medicaid Other | Attending: Family Medicine | Admitting: Family Medicine

## 2017-11-04 ENCOUNTER — Other Ambulatory Visit: Payer: Self-pay

## 2017-11-04 ENCOUNTER — Encounter: Payer: Self-pay | Admitting: Family Medicine

## 2017-11-04 VITALS — BP 147/83 | HR 79 | Temp 98.4°F | Resp 18 | Ht 64.0 in | Wt 188.6 lb

## 2017-11-04 DIAGNOSIS — I63511 Cerebral infarction due to unspecified occlusion or stenosis of right middle cerebral artery: Secondary | ICD-10-CM | POA: Diagnosis not present

## 2017-11-04 DIAGNOSIS — I63231 Cerebral infarction due to unspecified occlusion or stenosis of right carotid arteries: Secondary | ICD-10-CM

## 2017-11-04 DIAGNOSIS — I10 Essential (primary) hypertension: Secondary | ICD-10-CM

## 2017-11-04 DIAGNOSIS — M25561 Pain in right knee: Secondary | ICD-10-CM

## 2017-11-04 DIAGNOSIS — I252 Old myocardial infarction: Secondary | ICD-10-CM | POA: Insufficient documentation

## 2017-11-04 DIAGNOSIS — F17210 Nicotine dependence, cigarettes, uncomplicated: Secondary | ICD-10-CM | POA: Diagnosis not present

## 2017-11-04 DIAGNOSIS — Z7902 Long term (current) use of antithrombotics/antiplatelets: Secondary | ICD-10-CM

## 2017-11-04 DIAGNOSIS — I2510 Atherosclerotic heart disease of native coronary artery without angina pectoris: Secondary | ICD-10-CM

## 2017-11-04 DIAGNOSIS — Z23 Encounter for immunization: Secondary | ICD-10-CM

## 2017-11-04 DIAGNOSIS — G8929 Other chronic pain: Secondary | ICD-10-CM

## 2017-11-04 DIAGNOSIS — D649 Anemia, unspecified: Secondary | ICD-10-CM | POA: Insufficient documentation

## 2017-11-04 DIAGNOSIS — Z79899 Other long term (current) drug therapy: Secondary | ICD-10-CM

## 2017-11-04 DIAGNOSIS — E785 Hyperlipidemia, unspecified: Secondary | ICD-10-CM

## 2017-11-04 DIAGNOSIS — M549 Dorsalgia, unspecified: Secondary | ICD-10-CM | POA: Insufficient documentation

## 2017-11-04 MED ORDER — ATORVASTATIN CALCIUM 80 MG PO TABS: 80.0000 mg | ORAL_TABLET | Freq: Every day | ORAL | 6 refills | Status: DC

## 2017-11-04 MED ORDER — TICAGRELOR 90 MG PO TABS: 90.0000 mg | ORAL_TABLET | Freq: Two times a day (BID) | ORAL | 11 refills | Status: DC

## 2017-11-04 MED ORDER — PANTOPRAZOLE SODIUM 40 MG PO TBEC: 40.0000 mg | DELAYED_RELEASE_TABLET | Freq: Every day | ORAL | 11 refills | Status: DC

## 2017-11-04 NOTE — Progress Notes (Signed)
tdap refill on brilinta and lipitor

## 2017-11-04 NOTE — Progress Notes (Signed)
Subjective:    Patient ID: Kevin Barton, male    DOB: [birth year], 57 y.o.   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  Patient does not feel that he really has any residual side effects after the stroke.  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  Patient does not really recall a lot of what happened that day.  Patient does admit that he had used cocaine which likely contributed to his stroke.  Patient states that he has not used any drugs since his hospitalization.  Patient still smokes a few cigarettes per day but is trying to quit.      Patient states that he is not currently on blood pressure medicine after hospitalization.  Patient does not wish to restart blood pressure medication at this time but would rather return and have blood pressure checked and then start medication if needed.  Patient denies any headaches or dizziness related to his blood pressure his recent stroke.  Patient also with history of heart disease and patient states that he has had no chest pain or palpitations.  Patient is taking the medication for blood thinning and patient denies any unusual bruising or bleeding.  Patient states that he has had chronic, recurrent pain in his right knee which patient believes is related to playing football in high school.  Patient states the pain is worse if he has been sitting for a while and then tries to get up and walk.  Pain can sometimes be sharp.  Patient states the pain is about a 7 or 8 on a 0-to-10 scale.  Patient states that after he has been walking for a while, his knee actually feels better.  Patient states that he has had no prior surgeries other than surgery when he was a  child for correction of a lazy eye.  Patient denies any current issues with his vision. Past Medical History:  Diagnosis Date  . Arthritis   . CAD (coronary artery disease)    cath 06/2016 occlusion of RCA with collateralization from the left coronary artery. Mild nonobstructive plaque in the left main, LAD, and LCx. LVEF 50%. TX Rx.   . Chronic back pain   . Chronic knee pain   . HTN (hypertension)   . NSTEMI (non-ST elevated myocardial infarction) (HCC)   . Tobacco abuse    Past Surgical History:  Procedure Laterality Date  . IR ANGIO INTRA EXTRACRAN SEL COM CAROTID INNOMINATE UNI L MOD SED  10/05/2017  . IR ANGIO VERTEBRAL SEL SUBCLAVIAN INNOMINATE UNI R MOD SED  10/05/2017  . IR CT HEAD LTD  10/05/2017  . IR INTRAVSC STENT CERV CAROTID W/O EMB-PROT MOD SED INC ANGIO  10/05/2017  . IR PERCUTANEOUS ART THROMBECTOMY/INFUSION INTRACRANIAL INC DIAG ANGIO  10/05/2017  . LEFT HEART CATH AND CORONARY ANGIOGRAPHY N/A 07/25/2016   Procedure: Left Heart Cath and Coronary Angiography;  Surgeon: Tonny Bollman, MD;  Location: [facility] INVASIVE CV LAB;  Service: Cardiovascular;  Laterality: N/A;  . RADIOLOGY WITH ANESTHESIA N/A 10/05/2017   Procedure: IR WITH ANESTHESIA CODE STROKE;  Surgeon: Julieanne Cotton, MD;  Location: [facility] OR;  Service: Radiology;  Laterality: N/A;   Family History  Problem Relation Age  of Onset  . Diabetes Brother    Social History   Tobacco Use  . Smoking status: Current Every Day Smoker    Packs/day: 0.50    Types: Cigarettes  . Smokeless tobacco: Never Used  Substance Use Topics  . Alcohol use: Yes    Comment: a beer a day  . Drug use: No  No Known Allergies   Review of Systems  Constitutional: Negative for chills, fatigue and fever.  HENT: Positive for trouble swallowing. Negative for sore throat.   Eyes: Negative for pain and visual disturbance.  Respiratory: Negative for cough and shortness of breath.   Endocrine: Negative for polydipsia, polyphagia and  polyuria.  Genitourinary: Positive for frequency. Negative for dysuria.  Musculoskeletal: Positive for arthralgias and gait problem.  Neurological: Negative for dizziness, speech difficulty and headaches.       Objective:   Physical Exam  BP (!) 147/83   Pulse 79   Temp 98.4 F (36.9 C) (Oral)   Resp 18   Ht 5\' 4"  (1.626 m)   Wt 188 lb 9.6 oz (85.5 kg)   SpO2 99%   BMI 32.37 kg/m   Nursing note and vital signs reviewed General-well-nourished, well-developed overweight male in no acute distress. ENT- patient with disconjugate left eye movement, normal conjunctiva.  TMs gray, nares with mild edema, normal oropharynx.   Neck-supple, no lymphadenopathy, no carotid bruit appreciated on exam Lungs-clear to auscultation bilaterally. Cardiovascular-regular rate and rhythm Abdomen-soft, nontender Back-no CVA tenderness Extremities-no edema Neuro-cranial nerves II through XII are grossly intact with the exception of abnormality of left eye movement (patient reports surgery during childhood for correction of lazy eye); neuro exam is grossly intact/normal Musculoskeletal- patient with right knee medial joint line tenderness, knee joint is otherwise stable and no presence of effusion      Assessment & Plan:  1. 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  Patient reminded of importance of making sure that his blood pressure remains controlled, taking prescribed medications for hyperlipidemia and continuing Brilinta as patient also had placement of stent into the right internal carotid artery along with angioplasty secondary to occlusion. - Comprehensive metabolic panel - ticagrelor (BRILINTA) 90 MG TABS tablet; Take 1 tablet (90 mg total) by mouth 2 (two) times daily.  Dispense: 60 tablet; Refill: 11  2. 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 - atorvastatin (LIPITOR) 80 MG tablet; Take 1 tablet (80 mg total) by mouth daily at 6 PM.  Dispense: 30 tablet; Refill: 6 - ticagrelor (BRILINTA) 90 MG TABS tablet; Take 1 tablet (90 mg total) by mouth 2 (two) times daily.  Dispense: 60 tablet; Refill: 11  3. Essential (primary) hypertension Patient's blood pressure slightly elevated at today's visit.  Patient is not interested in being placed on medication at this time to lower the blood pressure.  Patient does have history of recent CVA as well as carotid stenosis.  Discussed with the patient that I would like to get his blood pressure to stay less than 140/90 and optimally in the range of 130/80.  Patient reports that he would like to return for repeat blood  pressure check and in the meantime would like to make dietary changes.  Discussed importance of control of blood pressure as patient has had prior CVA.  Patient also with recent echocardiogram during hospitalization which did show moderate LVH.  Patient's ejection fraction was 65 to 70%.  4. Coronary artery disease involving native coronary artery of native heart without angina pectoris Patient with coronary artery disease and has had past NSTEMI.  Patient has had heart cath done in May 2018 showing 1 vessel disease with total occlusion of the RCA.  Patient's heart disease is being managed medically.  Patient did see his cardiologist in March.  Patient is aware of the need to continue blood thinning medication as well as adherence with cholesterol medication.  Patient was given refill of his blood pressure medicine at his March visit but had some hypotension during recent admission for CVA and blood pressure  medicine was held and patient does not wish to restart blood pressure medication at this time. - Comprehensive metabolic panel - atorvastatin (LIPITOR) 80 MG tablet; Take 1 tablet (80 mg total) by mouth daily at 6 PM.  Dispense: 30 tablet; Refill: 6  5. Hyperlipidemia LDL goal <70 Patient with known coronary artery disease and status post recent CVA with occlusion of the right internal carotid artery.  Patient made aware of the need to remain compliant with diet along with use of Lipitor to help reduce buildup of atherosclerotic plaque.  Patient will have CMP in follow-up of use of statin medication - atorvastatin (LIPITOR) 80 MG tablet; Take 1 tablet (80 mg total) by mouth daily at 6 PM.  Dispense: 30 tablet; Refill: 6  6. Encounter for current long term use of antithrombotic drug Patient will have CBC in follow-up of his use of antithrombotic drug and patient will also be placed on pantoprazole 40 mg daily for stomach protection.  Patient will have CBC in follow-up of use of antithrombotic drug - CBC with Differential - pantoprazole (PROTONIX) 40 MG tablet; Take 1 tablet (40 mg total) by mouth daily. For stomach protection  Dispense: 30 tablet; Refill: 11  7. Anemia, unspecified type Patient with anemia during his recent hospitalization and patient will have CBC to check status.  Patient is also being placed on Protonix for stomach protection - CBC with Differential - pantoprazole (PROTONIX) 40 MG tablet; Take 1 tablet (40 mg total) by mouth daily. For stomach protection  Dispense: 30 tablet; Refill: 11  8. Encounter for long-term (current) use of medications Patient will have CMP in follow-up of long-term use of medications - Comprehensive metabolic panel  9. Chronic pain of right knee Patient may take Tylenol as needed for pain.  Patient should avoid the use of nonsteroidal anti-inflammatory secondary to current use of blood thinning medications.  Patient will be referred to orthopedics  for further evaluation and treatment - Ambulatory referral to Orthopedic Surgery  10. Need for immunization against influenza Patient was offered and agreed to receive influenza immunization at today's visit.  Patient was also given educational handout regarding immunization - Flu Vaccine QUAD 36+ mos IM  An After Visit Summary was printed and given to the patient.  Return in about 4 months (around 03/06/2018) for chronic medical issues.

## 2017-11-05 LAB — COMPREHENSIVE METABOLIC PANEL WITH GFR
ALT: 19 IU/L (ref 0–44)
AST: 26 IU/L (ref 0–40)
Albumin/Globulin Ratio: 1.6 (ref 1.2–2.2)
Albumin: 4.5 g/dL (ref 3.5–5.5)
Alkaline Phosphatase: 77 IU/L (ref 39–117)
BUN/Creatinine Ratio: 10 (ref 9–20)
BUN: 12 mg/dL (ref 6–24)
Bilirubin Total: <0.2 mg/dL (ref 0.0–1.2)
CO2: 20 mmol/L (ref 20–29)
Calcium: 9.4 mg/dL (ref 8.7–10.2)
Chloride: 109 mmol/L — ABNORMAL HIGH (ref 96–106)
Creatinine, Ser: 1.16 mg/dL (ref 0.76–1.27)
GFR calc Af Amer: 81 mL/min/1.73
GFR calc non Af Amer: 70 mL/min/1.73
Globulin, Total: 2.9 g/dL (ref 1.5–4.5)
Glucose: 91 mg/dL (ref 65–99)
Potassium: 4.4 mmol/L (ref 3.5–5.2)
Sodium: 144 mmol/L (ref 134–144)
Total Protein: 7.4 g/dL (ref 6.0–8.5)

## 2017-11-05 LAB — CBC WITH DIFFERENTIAL/PLATELET
Basophils Absolute: 0 x10E3/uL (ref 0.0–0.2)
Basos: 1 %
EOS (ABSOLUTE): 0.4 x10E3/uL (ref 0.0–0.4)
Eos: 7 %
Hematocrit: 30.9 % — ABNORMAL LOW (ref 37.5–51.0)
Hemoglobin: 10.1 g/dL — ABNORMAL LOW (ref 13.0–17.7)
Immature Grans (Abs): 0 x10E3/uL (ref 0.0–0.1)
Immature Granulocytes: 0 %
Lymphocytes Absolute: 1.9 x10E3/uL (ref 0.7–3.1)
Lymphs: 32 %
MCH: 29.8 pg (ref 26.6–33.0)
MCHC: 32.7 g/dL (ref 31.5–35.7)
MCV: 91 fL (ref 79–97)
Monocytes Absolute: 0.6 x10E3/uL (ref 0.1–0.9)
Monocytes: 10 %
Neutrophils Absolute: 2.9 x10E3/uL (ref 1.4–7.0)
Neutrophils: 50 %
Platelets: 290 x10E3/uL (ref 150–450)
RBC: 3.39 x10E6/uL — ABNORMAL LOW (ref 4.14–5.80)
RDW: 14.1 % (ref 12.3–15.4)
WBC: 5.8 x10E3/uL (ref 3.4–10.8)

## 2017-11-06 ENCOUNTER — Ambulatory Visit: Payer: Medicaid Other | Admitting: Adult Health

## 2017-11-07 ENCOUNTER — Encounter: Payer: Self-pay | Admitting: Cardiovascular Disease

## 2017-11-07 ENCOUNTER — Ambulatory Visit (INDEPENDENT_AMBULATORY_CARE_PROVIDER_SITE_OTHER): Payer: Medicaid Other | Admitting: Cardiovascular Disease

## 2017-11-07 VITALS — BP 140/86 | HR 76 | Ht 64.0 in | Wt 188.6 lb

## 2017-11-07 DIAGNOSIS — I10 Essential (primary) hypertension: Secondary | ICD-10-CM

## 2017-11-07 DIAGNOSIS — E782 Mixed hyperlipidemia: Secondary | ICD-10-CM | POA: Diagnosis not present

## 2017-11-07 DIAGNOSIS — I25119 Atherosclerotic heart disease of native coronary artery with unspecified angina pectoris: Secondary | ICD-10-CM

## 2017-11-07 NOTE — Patient Instructions (Addendum)
Medication Instructions:  Your provider recommends that you continue on your current medications as directed. Please refer to the Current Medication list given to you today.    Labwork: None  Testing/Procedures: None  Follow-Up: You have an appointment with Dr. Earmon Phoenixooper's assistant, Lorin PicketScott, on Monday, January 13 at 8:15AM.  Any Other Special Instructions Will Be Listed Below (If Applicable).     If you need a refill on your cardiac medications before your next appointment, please call your pharmacy.

## 2017-11-07 NOTE — Progress Notes (Signed)
Cardiology Office Note:    Date:  11/07/2017   ID:  Kevin Barton, DOB [birth year], MRN [id number]  PCP:  Cain SaupeFulp, Cammie, MD  Cardiologist:  Tonny BollmanMichael Malary Aylesworth, MD  Electrophysiologist:  None   Referring MD: No ref. provider found   Chief Complaint  Patient presents with  . Chest Pain    History of Present Illness:    Kevin Barton is a 57 y.o. male with a hx of coronary artery disease, presenting for follow-up evaluation.  He was evaluated for unstable angina in 2018 and ultimately found to have total occlusion of the right coronary artery with left-to-right collaterals and nonobstructive left coronary artery disease.  LV function had been normal and medical therapy was recommended.  He was hospitalized last month with a stroke after presenting with slurred speech, right gaze, and left-sided weakness and neglect.  He was diagnosed with a right MCA infarct and he received TPA and endovascular intervention.  This occurred in the setting of polysubstance abuse.  The patient is here alone today.  He is actually feeling really well.  He is back to work as a Administratorlandscaper and he is taking frequent breaks when it really hot outside.  He understands the need to stop using cocaine and he has not used any since he was discharged from the hospital.  He is trying to avoid people that are frequent users.  He is cut back on both alcohol and cigarettes and is now just smoking 1 or 2 cigarettes each day.  He denies chest pain or shortness of breath.  He denies leg swelling, orthopnea, or PND.  Past Medical History:  Diagnosis Date  . Arthritis   . CAD (coronary artery disease)    cath 06/2016 occlusion of RCA with collateralization from the left coronary artery. Mild nonobstructive plaque in the left main, LAD, and LCx. LVEF 50%. TX Rx.   . Chronic back pain   . Chronic knee pain   . HTN (hypertension)   . NSTEMI (non-ST elevated myocardial infarction) (HCC)   . Tobacco abuse     Past Surgical History:    Procedure Laterality Date  . IR ANGIO INTRA EXTRACRAN SEL COM CAROTID INNOMINATE UNI L MOD SED  10/05/2017  . IR ANGIO VERTEBRAL SEL SUBCLAVIAN INNOMINATE UNI R MOD SED  10/05/2017  . IR CT HEAD LTD  10/05/2017  . IR INTRAVSC STENT CERV CAROTID W/O EMB-PROT MOD SED INC ANGIO  10/05/2017  . IR PERCUTANEOUS ART THROMBECTOMY/INFUSION INTRACRANIAL INC DIAG ANGIO  10/05/2017  . LEFT HEART CATH AND CORONARY ANGIOGRAPHY N/A 07/25/2016   Procedure: Left Heart Cath and Coronary Angiography;  Surgeon: Tonny Bollmanooper, Demetry Bendickson, MD;  [facility];  Service: Cardiovascular;  Laterality: N/A;  . RADIOLOGY WITH ANESTHESIA N/A 10/05/2017   Procedure: IR WITH ANESTHESIA CODE STROKE;  Surgeon: Julieanne Cottoneveshwar, Sanjeev, MD;  Location: [facility] OR;  Service: Radiology;  Laterality: N/A;    Current Medications: Current Meds  Medication Sig  . aspirin EC 81 MG tablet Take 81 mg by mouth daily.  Marland Kitchen. atorvastatin (LIPITOR) 80 MG tablet Take 1 tablet (80 mg total) by mouth daily at 6 PM.  . ticagrelor (BRILINTA) 90 MG TABS tablet Take 1 tablet (90 mg total) by mouth 2 (two) times daily.     Allergies:   Patient has no known allergies.   Social History   Socioeconomic History  . Marital status: Single    Spouse name: Not on file  . Number of children: Not on file  .  Years of education: Not on file  . Highest education level: Not on file  Occupational History  . Not on file  Social Needs  . Financial resource strain: Not on file  . Food insecurity:    Worry: Not on file    Inability: Not on file  . Transportation needs:    Medical: Not on file    Non-medical: Not on file  Tobacco Use  . Smoking status: Current Every Day Smoker    Packs/day: 0.50    Types: Cigarettes  . Smokeless tobacco: Never Used  Substance and Sexual Activity  . Alcohol use: Yes    Comment: a beer a day  . Drug use: No  . Sexual activity: Yes  Lifestyle  . Physical activity:    Days per week: Not on file    Minutes per session: Not  on file  . Stress: Not on file  Relationships  . Social connections:    Talks on phone: Not on file    Gets together: Not on file    Attends religious service: Not on file    Active member of club or organization: Not on file    Attends meetings of clubs or organizations: Not on file    Relationship status: Not on file  Other Topics Concern  . Not on file  Social History Narrative  . Not on file     Family History: The patient's family history includes Diabetes in his brother.  ROS:   Please see the history of present illness.     All other systems reviewed and are negative.  EKGs/Labs/Other Studies Reviewed:    The following studies were reviewed today: Echocardiogram 10/06/2017: Study Conclusions  - Left ventricle: The cavity size was normal. Wall thickness was   increased in a pattern of moderate LVH. Systolic function was   vigorous. The estimated ejection fraction was in the range of 65%   to 70%. Wall motion was normal; there were no regional wall   motion abnormalities. Features are consistent with a pseudonormal   left ventricular filling pattern, with concomitant abnormal   relaxation and increased filling pressure (grade 2 diastolic   dysfunction). - Aortic valve: There was no stenosis. - Mitral valve: Mildly calcified annulus. Mildly calcified leaflets   . There was trivial regurgitation. - Right ventricle: The cavity size was normal. Systolic function   was normal. - Pulmonary arteries: No complete TR doppler jet so unable to   estimate PA systolic pressure. - Inferior vena cava: The vessel was normal in size. The   respirophasic diameter changes were in the normal range (>= 50%),   consistent with normal central venous pressure.  Impressions:  - Normal LV size with moderate LV hypertrophy. EF 60-65%. Moderate   diastolic dysfunction. Normal RV size and systolic function.  EKG:  EKG is not ordered today.    Recent Labs: 11/04/2017: ALT 19; BUN 12;  Creatinine, Ser 1.16; Hemoglobin 10.1; Platelets 290; Potassium 4.4; Sodium 144  Recent Lipid Panel    Component Value Date/Time   CHOL 193 10/06/2017 0500   CHOL 249 (H) 05/23/2017 1204   TRIG 111 10/06/2017 0500   HDL 59 10/06/2017 0500   HDL 82 05/23/2017 1204   CHOLHDL 3.3 10/06/2017 0500   VLDL 22 10/06/2017 0500   LDLCALC 112 (H) 10/06/2017 0500   LDLCALC 154 (H) 05/23/2017 1204    Physical Exam:    VS:  BP 140/86   Pulse 76   Ht 5\' 4"  (  1.626 m)   Wt 188 lb 9.6 oz (85.5 kg)   SpO2 98%   BMI 32.37 kg/m     Wt Readings from Last 3 Encounters:  11/07/17 188 lb 9.6 oz (85.5 kg)  11/04/17 188 lb 9.6 oz (85.5 kg)  10/05/17 190 lb 4.1 oz (86.3 kg)     GEN:  Well nourished, well developed in no acute distress HEENT: Normal NECK: No JVD; No carotid bruits LYMPHATICS: No lymphadenopathy CARDIAC: RRR, no murmurs, rubs, gallops RESPIRATORY:  Clear to auscultation without rales, wheezing or rhonchi  ABDOMEN: Soft, non-tender, non-distended MUSCULOSKELETAL:  No edema; No deformity  SKIN: Warm and dry NEUROLOGIC:  Alert and oriented x 3 PSYCHIATRIC:  Normal affect   ASSESSMENT:    1. Coronary artery disease involving native coronary artery of native heart with angina pectoris (HCC)   2. Essential hypertension   3. Mixed hyperlipidemia    PLAN:    In order of problems listed above:  1. The patient is stable currently without symptoms of angina.  He will continue on aspirin and ticagrelor after carotid stenting for treatment of his acute stroke.  He is tolerating a high intensity statin drug. 2. His chart is reviewed and he previously was on lisinopril and hydrochlorothiazide.  His medications were stopped during the hospital because his blood pressure was relatively low in the setting of acute stroke.  We will continue to observe for now, but consider a calcium channel blocker such as amlodipine if his blood pressure trends upward. 3. Taking a high intensity statin drug  with atorvastatin 80 mg.  We will ask him to follow-up in 4 months and obtain lab work at that time.  He is counseled extensively about diet, lifestyle, as well as substance abuse and the importance of staying off of alcohol, cigarettes, and cocaine.    Medication Adjustments/Labs and Tests Ordered: Current medicines are reviewed at length with the patient today.  Concerns regarding medicines are outlined above.  No orders of the defined types were placed in this encounter.  No orders of the defined types were placed in this encounter.   There are no Patient Instructions on file for this visit.   Signed, Tonny Bollman, MD  11/07/2017 2:45 PM    [facility]

## 2017-11-08 ENCOUNTER — Ambulatory Visit (HOSPITAL_COMMUNITY): Admission: RE | Admit: 2017-11-08 | Payer: Medicaid Other | Source: Ambulatory Visit

## 2017-11-12 NOTE — Progress Notes (Signed)
[facility] [address] [place]. [place] [address] [phone]       OFFICE FOLLOW UP NOTE  [name] Date of Birth:  [birth year] Medical Record Number:  [id number]   Reason for Referral:  hospital stroke follow up  CHIEF COMPLAINT:  Chief Complaint  Patient presents with  . Follow-up    Hospital Stroke follow up pt seen by Dr. Roda Shutters, pt in room alone    HPI: Kevin Barton is being seen today for initial visit in the office for right MCA infarct on 10/05/2017. History obtained from patient and chart review. Reviewed all radiology images and labs personally.  Kevin Stacksis an 57 y.o.malewith PMH significant for HTN, CAD, NSTEMI ( 06/2016) who presented to ED with suddent onset of slurred speech, right gaze, left side flaccid, and neglecting left side.  CT head reviewed and showed hyperdense right MCA consistent with thrombosis.  CTA head and neck showed right ICA occlusion from the bifurcation to the ICA terminus along with right M1 occlusion with some pial collateral flow distally.  Patient received IV TPA and then underwent endovascular treatment with TICI 3 reperfusion and right ICA stent.  MRI head reviewed and showed acute infarct in the right stratum and 2 punctate acute infarcts in the right parietal cortex.  MRA head showed patent bilateral MCAs.  2D echo showed an EF of 60 to 65%.  Carotid Doppler showed right ICA stent less than 50% stenosis and left ICA 1 to 39% stenosis.  UDS was positive for cocaine.  LDL 112 and his patient was on Zocor 40 mg PTA is recommended to start Lipitor 80 mg daily.  A1c satisfactory at 5.5.  Patient was on aspirin 81 mg PTA and recommended aspirin 81 mg and Brilinta 90 mg twice daily.  Recommended for follow-up appointment with Dr. Corliss Skains 4 weeks status post stent placement.  Patient was educated on importance of stopping tobacco use, substance abuse and alcohol abuse.  Patient discharged home in satisfactory condition without  therapy needs as he returned back to baseline.  Patient is being seen today for hospital follow-up and states overall he is been doing well.  Only complaint is right knee pain and believes that he has arthritis but does have an appointment with orthopedics tomorrow for this to be evaluated.  He continues to take both aspirin and Brilinta without side effects of bleeding or bruising.  Continues to take Lipitor without side effects of myalgias.  Blood pressure today satisfactory 127/73.  He did have follow-up appointment scheduled with Dr. Corliss Skains on 11/08/2017 but per epic, patient was a no-show.  Patient states that he was unaware of this appointment at that time due to numerous appointments that were made post hospital discharge was unable to keep track of all of them.  Patient plans on calling office to schedule follow-up appointment.  Patient continues to smoke cigarettes but has greatly decreased the amount and plans on stopping completely.  He denies continued use of cocaine and also states that he has been trying to avoid being around known cocaine users.  Denies new or worsening stroke/TIA symptoms.    ROS:   14 system review of systems performed and negative with exception of knee pain  PMH:  Past Medical History:  Diagnosis Date  . Arthritis   . CAD (coronary artery disease)    cath 06/2016 occlusion of RCA with collateralization from the left coronary artery. Mild nonobstructive plaque in the left main, LAD, and LCx. LVEF 50%.  TX Rx.   . Chronic back pain   . Chronic knee pain   . HTN (hypertension)   . NSTEMI (non-ST elevated myocardial infarction) (HCC)   . Stroke (HCC)   . Tobacco abuse     PSH:  Past Surgical History:  Procedure Laterality Date  . IR ANGIO INTRA EXTRACRAN SEL COM CAROTID INNOMINATE UNI L MOD SED  10/05/2017  . IR ANGIO VERTEBRAL SEL SUBCLAVIAN INNOMINATE UNI R MOD SED  10/05/2017  . IR CT HEAD LTD  10/05/2017  . IR INTRAVSC STENT CERV CAROTID W/O EMB-PROT MOD  SED INC ANGIO  10/05/2017  . IR PERCUTANEOUS ART THROMBECTOMY/INFUSION INTRACRANIAL INC DIAG ANGIO  10/05/2017  . LEFT HEART CATH AND CORONARY ANGIOGRAPHY N/A 07/25/2016   Procedure: Left Heart Cath and Coronary Angiography;  Surgeon: Tonny Bollmanooper, Michael, MD;  [facility];  Service: Cardiovascular;  Laterality: N/A;  . RADIOLOGY WITH ANESTHESIA N/A 10/05/2017   Procedure: IR WITH ANESTHESIA CODE STROKE;  Surgeon: Julieanne Cottoneveshwar, Sanjeev, MD;  Location: [facility] OR;  Service: Radiology;  Laterality: N/A;    Social History:  Social History   Socioeconomic History  . Marital status: Single    Spouse name: Not on file  . Number of children: Not on file  . Years of education: Not on file  . Highest education level: Not on file  Occupational History  . Not on file  Social Needs  . Financial resource strain: Not on file  . Food insecurity:    Worry: Not on file    Inability: Not on file  . Transportation needs:    Medical: Not on file    Non-medical: Not on file  Tobacco Use  . Smoking status: Current Every Day Smoker    Packs/day: 0.50    Types: Cigarettes  . Smokeless tobacco: Never Used  . Tobacco comment: smoke 6 per week  Substance and Sexual Activity  . Alcohol use: Yes    Comment: a beer a day  . Drug use: No  . Sexual activity: Yes  Lifestyle  . Physical activity:    Days per week: Not on file    Minutes per session: Not on file  . Stress: Not on file  Relationships  . Social connections:    Talks on phone: Not on file    Gets together: Not on file    Attends religious service: Not on file    Active member of club or organization: Not on file    Attends meetings of clubs or organizations: Not on file    Relationship status: Not on file  . Intimate partner violence:    Fear of current or ex partner: Not on file    Emotionally abused: Not on file    Physically abused: Not on file    Forced sexual activity: Not on file  Other Topics Concern  . Not on file  Social  History Narrative  . Not on file    Family History:  Family History  Problem Relation Age of Onset  . Diabetes Brother     Medications:   Current Outpatient Medications on File Prior to Visit  Medication Sig Dispense Refill  . aspirin EC 81 MG tablet Take 81 mg by mouth daily.    Marland Kitchen. atorvastatin (LIPITOR) 80 MG tablet Take 1 tablet (80 mg total) by mouth daily at 6 PM. 30 tablet 6  . ticagrelor (BRILINTA) 90 MG TABS tablet Take 1 tablet (90 mg total) by mouth 2 (two) times daily.  60 tablet 11   No current facility-administered medications on file prior to visit.     Allergies:  No Known Allergies   Physical Exam  Vitals:   11/13/17 0837  BP: 127/73  Pulse: 83  Weight: 188 lb (85.3 kg)  Height: 5\' 4"  (1.626 m)   Body mass index is 32.27 kg/m. No exam data present  General: well developed, well nourished, pleasant middle-aged African-American male, seated, in no evident distress Head: head normocephalic and atraumatic.   Neck: supple with no carotid or supraclavicular bruits Cardiovascular: regular rate and rhythm, no murmurs Musculoskeletal: no deformity Skin:  no rash/petichiae Vascular:  Normal pulses all extremities  Neurologic Exam Mental Status: Awake and fully alert. Oriented to place and time. Recent and remote memory intact. Attention span, concentration and fund of knowledge appropriate. Mood and affect appropriate.  Cranial Nerves: Fundoscopic exam reveals sharp disc margins. Pupils equal, briskly reactive to light. Extraocular movements full without nystagmus. Visual fields full to confrontation. Hearing intact. Facial sensation intact. Face, tongue, palate moves normally and symmetrically.  Motor: Normal bulk and tone. Normal strength in all tested extremity muscles except for very mild left upper extremity weakness  Sensory.: intact to touch , pinprick , position and vibratory sensation.  Coordination: Rapid alternating movements normal in all extremities.  Finger-to-nose and heel-to-shin performed accurately bilaterally.  Orbits right arm over left arm Gait and Station: Arises from chair without difficulty. Stance is normal. Gait demonstrates normal stride length and balance except for mild limping on left leg due to knee pain. Able to heel, toe and tandem walk without difficulty.  Reflexes: 1+ and symmetric. Toes downgoing.    NIHSS  0 Modified Rankin  1   Diagnostic Data (Labs, Imaging, Testing)  CT HEAD WO CONTRAST 10/05/2017 IMPRESSION:  Hyperdense right MCA consistent with thrombosis in this setting.No acute hemorrhage. Aspects is 10.   Ct Angio Head W Or Wo Contrast Ct Angio Neck W And/or Wo Contrast 10/05/2017 IMPRESSION:  1.Right internal carotid artery occluded from the bifurcation through the ICA terminus.  2. Retrograde flow into the right A1 segment via a patent anterior communicating artery. There is slight opacification into the ICA terminus.  3. Some right MCA branch vessels are reconstituted via pial collaterals.  4. Moderate proximal narrowing in the left M2 branch vessels without focal occlusion.  5. Mild distal PCA branch vessel irregularity without a significant posterior circulation stenosis or occlusion. 6. Bilateral maxillary dental disease.  7. Edema or atelectasis throughout the lung apices.   Mr Brain Wo Contrast 10/06/2017 1. Acute infarct in the right stratum. Two punctate acute infarcts in the right parietal cortex. 2. 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 3. Significantly motion degraded.   MrMra HeadWo Contrast 10/06/2017 Severely motion degraded. There is diagnostic visualization of the bilateral M1 segments which are widely patent.   DSA 10/05/2017 S/P bilateral common carotid arteriogram,RT Vert angiogram followed by complete revascularization of RT [facility] occlusionwith x 1 pass with 5mm x 33 mm embotrap device achieving a TICI 3 reperfusionand revascularization of acutely occluded RT ICA  proxwith stent assisted angioplasty.  Transthoracic Echocardiogram - Left ventricle: The cavity size was normal. Wall thickness wasincreased in a pattern of moderate LVH. Systolic function wasvigorous. The estimated ejection fraction was in the range of 65%to 70%. Wall motion was normal; there were no regional wallmotion abnormalities. Features are consistent with a pseudonormalleft ventricular filling pattern, with concomitant abnormalrelaxation and increased filling pressure (grade 2 diastolicdysfunction). - Aortic valve: There  was no stenosis. - Mitral valve: Mildly calcified annulus. Mildly calcified leaflets. There was trivial regurgitation. - Right ventricle: The cavity size was normal. Systolic function was normal. - Pulmonary arteries: No complete TR doppler jet so unabletoestimate PA systolic pressure. - Inferior vena cava: The vessel was normal in size. Therespirophasic diameter changes were in the normal range (>= 50%),consistent with normal central venous pressure. Impressions: Normal LV size with moderate LV hypertrophy. EF 60-65%. Moderatediastolic dysfunction. Normal RV size and systolic function.  Carotid Doppler   Right ICA stent patent withless than 50% stenosis. Left CCA defused hypoechoic irregular plaque appears in mid CCA with intimal thickening, Left ICA velocity classified as 1-39% according to the category of velocity. Bilateral vertebral arteries patent with antegrade flow.   ASSESSMENT: Kevin Barton is a 57 y.o. 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 Vascular risk factors include HTN, HLD, CAD, NSTEMI, tobacco use and substance abuse.  Patient is being seen today for hospital follow-up and overall has been stable from a stroke standpoint with residual deficit of only mild left upper extremity  weakness.    PLAN: -Continue aspirin 81 mg daily and Brilinta  and Lipitor 80 mg for secondary stroke prevention -F/u with PCP regarding your HLD and HTN management -Instructed patient to contact Dr. Fatima Sanger office to schedule follow-up appointment that was previously missed in office number was provided for follow-up of right ICA stent -continue to monitor BP at home -Highly encouraged smoking cessation along with continued avoidance of cocaine use -Recommended Tylenol as needed for knee pain and advised to avoid use of ibuprofen -advised to continue to stay active and maintain a healthy diet -Maintain strict control of hypertension with blood pressure goal below 130/90, diabetes with hemoglobin A1c goal below 6.5% and cholesterol with LDL cholesterol (bad cholesterol) goal below 70 mg/dL. I also advised the patient to eat a healthy diet with plenty of whole grains, cereals, fruits and vegetables, exercise regularly and maintain ideal body weight.  Follow up in 3 months or call earlier if needed   Greater than 50% of time during this 25 minute visit was spent on counseling,explanation of diagnosis of right MCA infarct, reviewing risk factor management of HTN, HLD, CAD, NSTEMI and right ICA stent, planning of further management, discussion with patient and family and coordination of care    George Hugh, AGNP-BC  [facility] [address] [place], [place] [address]  Phone [phone] Fax [phone] Note: This document was prepared with digital dictation and possible smart phrase technology. Any transcriptional errors that result from this process are unintentional.

## 2017-11-13 ENCOUNTER — Telehealth: Payer: Self-pay

## 2017-11-13 ENCOUNTER — Encounter: Payer: Self-pay | Admitting: Adult Health

## 2017-11-13 ENCOUNTER — Ambulatory Visit (INDEPENDENT_AMBULATORY_CARE_PROVIDER_SITE_OTHER): Payer: Medicaid Other | Admitting: Adult Health

## 2017-11-13 VITALS — BP 127/73 | HR 83 | Ht 64.0 in | Wt 188.0 lb

## 2017-11-13 DIAGNOSIS — I63511 Cerebral infarction due to unspecified occlusion or stenosis of right middle cerebral artery: Secondary | ICD-10-CM

## 2017-11-13 DIAGNOSIS — I63231 Cerebral infarction due to unspecified occlusion or stenosis of right carotid arteries: Secondary | ICD-10-CM

## 2017-11-13 DIAGNOSIS — E785 Hyperlipidemia, unspecified: Secondary | ICD-10-CM | POA: Diagnosis not present

## 2017-11-13 DIAGNOSIS — I10 Essential (primary) hypertension: Secondary | ICD-10-CM | POA: Diagnosis not present

## 2017-11-13 DIAGNOSIS — Z720 Tobacco use: Secondary | ICD-10-CM | POA: Diagnosis not present

## 2017-11-13 NOTE — Telephone Encounter (Signed)
Patient was called, verified DOB, and was given most recent lab results. Patient verbalized understanding and had no further questions.  

## 2017-11-13 NOTE — Telephone Encounter (Signed)
-----   Message from Kevin Saupeammie Fulp, MD sent at 11/05/2017 11:22 AM EDT ----- Please notify patient of normal CMP.  Patient does have continued anemia on CBC.  This will be reevaluated at his next visit.

## 2017-11-13 NOTE — Patient Instructions (Signed)
Continue aspirin 81 mg daily and Brilinta  and lipitor 80mg   for secondary stroke prevention  Continue to follow up with PCP regarding cholesterol and blood pressure management   Please call dr. Corliss Skainseveshwar office to reschedule follow up appointment - [phone]  Tylenol as needed for pain - do not take ibuprofen, advil, aleve or naproxen   Continue to monitor blood pressure at home  Maintain strict control of hypertension with blood pressure goal below 130/90, diabetes with hemoglobin A1c goal below 6.5% and cholesterol with LDL cholesterol (bad cholesterol) goal below 70 mg/dL. I also advised the patient to eat a healthy diet with plenty of whole grains, cereals, fruits and vegetables, exercise regularly and maintain ideal body weight.  Followup in the future with me in 3 months or call earlier if needed       [facility]. I hope we have been able to provide you high quality care today.  You may receive a patient satisfaction survey over the next few weeks. We would appreciate your feedback and comments so that we may continue to improve ourselves and the health of our patients.

## 2017-11-14 ENCOUNTER — Ambulatory Visit (INDEPENDENT_AMBULATORY_CARE_PROVIDER_SITE_OTHER): Payer: Self-pay | Admitting: Orthopaedic Surgery

## 2017-11-14 NOTE — Progress Notes (Signed)
I agree with the above plan 

## 2017-11-21 ENCOUNTER — Ambulatory Visit (INDEPENDENT_AMBULATORY_CARE_PROVIDER_SITE_OTHER): Payer: Medicaid Other | Admitting: Orthopaedic Surgery

## 2017-11-21 ENCOUNTER — Ambulatory Visit (INDEPENDENT_AMBULATORY_CARE_PROVIDER_SITE_OTHER): Payer: Medicaid Other

## 2017-11-21 DIAGNOSIS — M1711 Unilateral primary osteoarthritis, right knee: Secondary | ICD-10-CM

## 2017-11-21 DIAGNOSIS — M25561 Pain in right knee: Secondary | ICD-10-CM | POA: Insufficient documentation

## 2017-11-21 MED ORDER — LIDOCAINE HCL 1 % IJ SOLN
2.0000 mL | INTRAMUSCULAR | Status: AC | PRN
  Administered 2017-11-21: 2 mL

## 2017-11-21 MED ORDER — BUPIVACAINE HCL 0.25 % IJ SOLN
2.0000 mL | INTRAMUSCULAR | Status: AC | PRN
  Administered 2017-11-21: 2 mL via INTRA_ARTICULAR

## 2017-11-21 MED ORDER — METHYLPREDNISOLONE ACETATE 40 MG/ML IJ SUSP
40.0000 mg | INTRAMUSCULAR | Status: AC | PRN
  Administered 2017-11-21: 40 mg via INTRA_ARTICULAR

## 2017-11-21 NOTE — Progress Notes (Signed)
Office Visit Note   Patient: Kevin Barton           Date of Birth: January 28, 1961           MRN: [id number] Visit Date: 11/21/2017              Requested by: Cain Saupe, MD [address] [place], [place] [address] PCP: Cain Saupe, MD   Assessment & Plan: Visit Diagnoses:  1. Primary localized osteoarthritis of right knee     Plan: Impression is in stage of joint disease right knee.  The patient associate out from his CVA that he will not likely be cleared for surgery for at least 6 months following the stroke.  We will go ahead and inject his right knee with cortisone today in hopes of giving him some relief.  In the meantime, he will follow-up with his doctor who is managing his stroke and stent placements to ask when he will likely be a candidate for right total knee replacement.  He will follow-up with [place] 2 months before possible joint replacement surgery.  Call with concerns or questions in the meantime.  Follow-Up Instructions: Return if symptoms worsen or fail to improve.   Orders:  Orders Placed This Encounter  Procedures  . Large Joint Inj: R knee  . XR KNEE 3 VIEW RIGHT   No orders of the defined types were placed in this encounter.     Procedures: Large Joint Inj: R knee on 11/21/2017 9:56 AM Indications: pain Details: 22 G needle, anterolateral approach Medications: 2 mL bupivacaine 0.25 %; 2 mL lidocaine 1 %; 40 mg methylPREDNISolone acetate 40 MG/ML      Clinical Data: No additional findings.   Subjective: Chief Complaint  Patient presents with  . Right Knee - Pain    HPI patient is a pleasant 57 year old gentleman who presents to our clinic today with right knee pain.  This began approximately 6 years ago without any known injury or change in activity.  It has progressively worsened.  He does note a previous injury several years back while playing football where he thinks he tore a ligament but never had surgical intervention for this.  The pain  he has is to the entire knee.  Worse with standing and with activity.  He has pain at rest and pain at night.  He has tried Tylenol and Advil without relief of symptoms.  His primary care provider from oxycodone for some time but he has since stopped this.  No previous cortisone injection to the right knee.  Of note, he did have a CVA approximately 1 month ago and is currently on aspirin and Brilinta.  He does note 2 stent placements a little over a year ago.  Review of Systems as detailed in HPI.  All others reviewed and are negative.   Objective: Vital Signs: There were no vitals taken for this visit.  Physical Exam well-developed well-nourished gentleman no acute distress.  Alert and oriented x3.  Ortho Exam examination of the right knee reveals a valgus deformity which is correctable.  He has moderate tenderness medial joint line.  He does have slight laxity with valgus and varus stress, but without pain.  Moderate patellofemoral crepitus.  He is neurovascularly intact distally.  Specialty Comments:  No specialty comments available.  Imaging: Xr Knee 3 View Right  Result Date: 11/21/2017 X-rays of the right knee show severe lateral and patellofemoral compartment changes with periarticular spurring and multiple loose bodies.  PMFS History: Patient Active Problem List   Diagnosis Date Noted  . Right knee pain 11/21/2017  . Anemia 11/04/2017  . Carotid artery occlusion with infarction (HCC) - R ICA s/p stent 10/08/2017  . Cocaine use 10/08/2017  . Intracranial atherosclerosis 10/08/2017  . Bradycardia 10/08/2017  . Alcohol use 10/08/2017  . Obesity (BMI 30.0-34.9) 10/08/2017  . Acute blood loss anemia 10/08/2017  . 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 CAD (coronary artery disease) 04/28/2017  . Essential (primary) hypertension 04/28/2017  . Tobacco abuse 04/28/2017  . Hyperlipidemia LDL  goal <70 04/28/2017  . NSTEMI (non-ST elevated myocardial infarction) [facility])    Past Medical History:  Diagnosis Date  . Arthritis   . CAD (coronary artery disease)    cath 06/2016 occlusion of RCA with collateralization from the left coronary artery. Mild nonobstructive plaque in the left main, LAD, and LCx. LVEF 50%. TX Rx.   . Chronic back pain   . Chronic knee pain   . HTN (hypertension)   . NSTEMI (non-ST elevated myocardial infarction) (HCC)   . Stroke (HCC)   . Tobacco abuse     Family History  Problem Relation Age of Onset  . Diabetes Brother     Past Surgical History:  Procedure Laterality Date  . IR ANGIO INTRA EXTRACRAN SEL COM CAROTID INNOMINATE UNI L MOD SED  10/05/2017  . IR ANGIO VERTEBRAL SEL SUBCLAVIAN INNOMINATE UNI R MOD SED  10/05/2017  . IR CT HEAD LTD  10/05/2017  . IR INTRAVSC STENT CERV CAROTID W/O EMB-PROT MOD SED INC ANGIO  10/05/2017  . IR PERCUTANEOUS ART THROMBECTOMY/INFUSION INTRACRANIAL INC DIAG ANGIO  10/05/2017  . LEFT HEART CATH AND CORONARY ANGIOGRAPHY N/A 07/25/2016   Procedure: Left Heart Cath and Coronary Angiography;  Surgeon: Tonny Bollman, MD;  [facility] CV [facility];  Service: Cardiovascular;  Laterality: N/A;  . RADIOLOGY WITH ANESTHESIA N/A 10/05/2017   Procedure: IR WITH ANESTHESIA CODE STROKE;  Surgeon: Julieanne Cotton, MD;  Location: [facility] OR;  Service: Radiology;  Laterality: N/A;   Social History   Occupational History  . Not on file  Tobacco Use  . Smoking status: Current Every Day Smoker    Packs/day: 0.50    Types: Cigarettes  . Smokeless tobacco: Never Used  . Tobacco comment: smoke 6 per week  Substance and Sexual Activity  . Alcohol use: Yes    Comment: a beer a day  . Drug use: No  . Sexual activity: Yes

## 2018-01-09 ENCOUNTER — Other Ambulatory Visit: Payer: Self-pay

## 2018-01-09 NOTE — Patient Outreach (Signed)
First attempt to obtain mRs. Number not accepting calls at this time.

## 2018-01-13 ENCOUNTER — Other Ambulatory Visit: Payer: Self-pay

## 2018-01-13 NOTE — Patient Outreach (Signed)
Second attempt to obtain mRs. Patient's home # not accepting calls. Spoke with Nicole C[place]othy [place] and [place] at work. Will call me back this afternoon.

## 2018-01-20 ENCOUNTER — Other Ambulatory Visit: Payer: Self-pay

## 2018-01-20 NOTE — Patient Outreach (Signed)
3 outreach attempts were completed to obtain mRs. mRs could not be obtained because patient never returned my calls. MRs=7  

## 2018-02-11 NOTE — Progress Notes (Deleted)
[facility] [address] [place] [place] [address] [phone]       OFFICE FOLLOW UP NOTE  [name] Date of Birth:  05/10/1960 Medical Record Number:  [id number]   Reason for Referral:  hospital stroke follow up  CHIEF COMPLAINT:  No chief complaint on file.   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 History obtained from patient and chart review. Reviewed all radiology images and labs personally.  Kevin Stacksis an 57 y.o.malewith PMH significant for HTN, CAD, NSTEMI ( 06/2016) who presented to ED with suddent onset of slurred speech, right gaze, left side flaccid, and neglecting left side.  CT head reviewed and showed hyperdense right MCA consistent with thrombosis.  CTA head and neck showed right ICA occlusion from the bifurcation to the ICA terminus along with right M1 occlusion with some pial collateral flow distally.  Patient received IV TPA and then underwent endovascular treatment with TICI 3 reperfusion and right ICA stent.  MRI head reviewed and showed acute infarct in the right stratum and 2 punctate acute infarcts in the right parietal cortex.  MRA head showed patent bilateral MCAs.  2D echo showed an EF of 60 to 65%.  Carotid Doppler showed right ICA stent less than 50% stenosis and left ICA 1 to 39% stenosis.  UDS was positive for cocaine.  LDL 112 and his patient was on Zocor 40 mg PTA is recommended to start Lipitor 80 mg daily.  A1c satisfactory at 5.5.  Patient was on aspirin 81 mg PTA and recommended aspirin 81 mg and Brilinta 90 mg twice daily.  Recommended for follow-up appointment with Dr. Corliss Skains 4 weeks status post stent placement.  Patient was educated on importance of stopping tobacco use, substance abuse and alcohol abuse.  Patient discharged home in satisfactory condition without therapy needs as he returned back to baseline.  11/13/2017 visit: Patient is being seen  today for hospital follow-up and states overall he is been doing well.  Only complaint is right knee pain and believes that he has arthritis but does have an appointment with orthopedics tomorrow for this to be evaluated.  He continues to take both aspirin and Brilinta without side effects of bleeding or bruising.  Continues to take Lipitor without side effects of myalgias.  Blood pressure today satisfactory 127/73.  He did have follow-up appointment scheduled with Dr. Corliss Skains on 11/08/2017 but per epic, patient was a no-show.  Patient states that he was unaware of this appointment at that time due to numerous appointments that were made post hospital discharge was unable to keep track of all of them.  Patient plans on calling office to schedule follow-up appointment.  Patient continues to smoke cigarettes but has greatly decreased the amount and plans on stopping completely.  He denies continued use of cocaine and also states that he has been trying to avoid being around known cocaine users.  Denies new or worsening stroke/TIA symptoms.  Interval history 02/12/2018: Patient is being seen today for 34-month follow-up visit.   ROS:   14 system review of systems performed and negative with exception of knee pain  PMH:  Past Medical History:  Diagnosis Date  . Arthritis   . CAD (coronary artery disease)    cath 06/2016 occlusion of RCA with collateralization from the left coronary artery. Mild nonobstructive plaque in the left main, LAD, and LCx. LVEF 50%. TX Rx.   . Chronic back  pain   . Chronic knee pain   . HTN (hypertension)   . NSTEMI (non-ST elevated myocardial infarction) (HCC)   . Stroke (HCC)   . Tobacco abuse     PSH:  Past Surgical History:  Procedure Laterality Date  . IR ANGIO INTRA EXTRACRAN SEL COM CAROTID INNOMINATE UNI L MOD SED  10/05/2017  . IR ANGIO VERTEBRAL SEL SUBCLAVIAN INNOMINATE UNI R MOD SED  10/05/2017  . IR CT HEAD LTD  10/05/2017  . IR INTRAVSC STENT CERV CAROTID W/O  EMB-PROT MOD SED INC ANGIO  10/05/2017  . IR PERCUTANEOUS ART THROMBECTOMY/INFUSION INTRACRANIAL INC DIAG ANGIO  10/05/2017  . LEFT HEART CATH AND CORONARY ANGIOGRAPHY N/A 07/25/2016   Procedure: Left Heart Cath and Coronary Angiography;  Surgeon: Tonny Bollman, MD;  Location: [facility] INVASIVE CV LAB;  Service: Cardiovascular;  Laterality: N/A;  . RADIOLOGY WITH ANESTHESIA N/A 10/05/2017   Procedure: IR WITH ANESTHESIA CODE STROKE;  Surgeon: Julieanne Cotton, MD;  Location: [facility] OR;  Service: Radiology;  Laterality: N/A;    Social History:  Social History   Socioeconomic History  . Marital status: Single    Spouse name: Not on file  . Number of children: Not on file  . Years of education: Not on file  . Highest education level: Not on file  Occupational History  . Not on file  Social Needs  . Financial resource strain: Not on file  . Food insecurity:    Worry: Not on file    Inability: Not on file  . Transportation needs:    Medical: Not on file    Non-medical: Not on file  Tobacco Use  . Smoking status: Current Every Day Smoker    Packs/day: 0.50    Types: Cigarettes  . Smokeless tobacco: Never Used  . Tobacco comment: smoke 6 per week  Substance and Sexual Activity  . Alcohol use: Yes    Comment: a beer a day  . Drug use: No  . Sexual activity: Yes  Lifestyle  . Physical activity:    Days per week: Not on file    Minutes per session: Not on file  . Stress: Not on file  Relationships  . Social connections:    Talks on phone: Not on file    Gets together: Not on file    Attends religious service: Not on file    Active member of club or organization: Not on file    Attends meetings of clubs or organizations: Not on file    Relationship status: Not on file  . Intimate partner violence:    Fear of current or ex partner: Not on file    Emotionally abused: Not on file    Physically abused: Not on file    Forced sexual activity: Not on file  Other Topics Concern  . Not on  file  Social History Narrative  . Not on file    Family History:  Family History  Problem Relation Age of Onset  . Diabetes Brother     Medications:   Current Outpatient Medications on File Prior to Visit  Medication Sig Dispense Refill  . aspirin EC 81 MG tablet Take 81 mg by mouth daily.    Marland Kitchen atorvastatin (LIPITOR) 80 MG tablet Take 1 tablet (80 mg total) by mouth daily at 6 PM. 30 tablet 6  . ticagrelor (BRILINTA) 90 MG TABS tablet Take 1 tablet (90 mg total) by mouth 2 (two) times daily. 60 tablet 11   No current  facility-administered medications on file prior to visit.     Allergies:  No Known Allergies   Physical Exam  There were no vitals filed for this visit. There is no height or weight on file to calculate BMI. No exam data present  General: well developed, well nourished, pleasant middle-aged African-American male, seated, in no evident distress Head: head normocephalic and atraumatic.   Neck: supple with no carotid or supraclavicular bruits Cardiovascular: regular rate and rhythm, no murmurs Musculoskeletal: no deformity Skin:  no rash/petichiae Vascular:  Normal pulses all extremities  Neurologic Exam Mental Status: Awake and fully alert. Oriented to place and time. Recent and remote memory intact. Attention span, concentration and fund of knowledge appropriate. Mood and affect appropriate.  Cranial Nerves: Fundoscopic exam reveals sharp disc margins. Pupils equal, briskly reactive to light. Extraocular movements full without nystagmus. Visual fields full to confrontation. Hearing intact. Facial sensation intact. Face, tongue, palate moves normally and symmetrically.  Motor: Normal bulk and tone. Normal strength in all tested extremity muscles except for very mild left upper extremity weakness  Sensory.: intact to touch , pinprick , position and vibratory sensation.  Coordination: Rapid alternating movements normal in all extremities. Finger-to-nose and  heel-to-shin performed accurately bilaterally.  Orbits right arm over left arm Gait and Station: Arises from chair without difficulty. Stance is normal. Gait demonstrates normal stride length and balance except for mild limping on left leg due to knee pain. Able to heel, toe and tandem walk without difficulty.  Reflexes: 1+ and symmetric. Toes downgoing.    NIHSS  0 Modified Rankin  1   Diagnostic Data (Labs, Imaging, Testing)  CT HEAD WO CONTRAST 10/05/2017 IMPRESSION:  Hyperdense right MCA consistent with thrombosis in this setting.No acute hemorrhage. Aspects is 10.   Ct Angio Head W Or Wo Contrast Ct Angio Neck W And/or Wo Contrast 10/05/2017 IMPRESSION:  1.Right internal carotid artery occluded from the bifurcation through the ICA terminus.  2. Retrograde flow into the right A1 segment via a patent anterior communicating artery. There is slight opacification into the ICA terminus.  3. Some right MCA branch vessels are reconstituted via pial collaterals.  4. Moderate proximal narrowing in the left M2 branch vessels without focal occlusion.  5. Mild distal PCA branch vessel irregularity without a significant posterior circulation stenosis or occlusion. 6. Bilateral maxillary dental disease.  7. Edema or atelectasis throughout the lung apices.   Mr Brain Wo Contrast 10/06/2017 1. Acute infarct in the right stratum. Two punctate acute infarcts in the right parietal cortex. 2. 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 3. Significantly motion degraded.   MrMra HeadWo Contrast 10/06/2017 Severely motion degraded. There is diagnostic visualization of the bilateral M1 segments which are widely patent.   DSA 10/05/2017 S/P bilateral common carotid arteriogram,RT Vert angiogram followed by complete revascularization of RT [facility] occlusion[facility] x 1 pass with 5mm x 33 mm embotrap device achieving a TICI 3 reperfusionand revascularization of acutely occluded RT ICA proxwith stent  assisted angioplasty.  Transthoracic Echocardiogram - Left ventricle: The cavity size was normal. Wall thickness wasincreased in a pattern of moderate LVH. Systolic function wasvigorous. The estimated ejection fraction was in the range of 65%to 70%. Wall motion was normal; there were no regional wallmotion abnormalities. Features are consistent with a pseudonormalleft ventricular filling pattern, with concomitant abnormalrelaxation and increased filling pressure (grade 2 diastolicdysfunction). - Aortic valve: There was no stenosis. - Mitral valve: Mildly calcified annulus. Mildly calcified leaflets. There was trivial regurgitation. - Right ventricle:  The cavity size was normal. Systolic function was normal. - Pulmonary arteries: No complete TR doppler jet so unabletoestimate PA systolic pressure. - Inferior vena cava: The vessel was normal in size. Therespirophasic diameter changes were in the normal range (>= 50%),consistent with normal central venous pressure. Impressions: Normal LV size with moderate LV hypertrophy. EF 60-65%. Moderatediastolic dysfunction. Normal RV size and systolic function.  Carotid Doppler   Right ICA stent patent withless than 50% stenosis. Left CCA defused hypoechoic irregular plaque appears in mid CCA with intimal thickening, Left ICA velocity classified as 1-39% according to the category of velocity. Bilateral vertebral arteries patent with antegrade flow.   ASSESSMENT: Kevin Barton is a 57 y.o. 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 Vascular risk factors include HTN, HLD, CAD, NSTEMI, tobacco use and substance abuse.  Patient is being seen today for hospital follow-up and overall has been stable from a stroke standpoint with residual deficit of only mild left upper extremity  weakness.    PLAN: -Continue aspirin 81 mg daily and Brilinta  and Lipitor 80 mg for secondary stroke prevention -F/u with PCP regarding your HLD and HTN management -Instructed patient to contact Dr. Fatima Sanger office to schedule follow-up appointment that was previously missed in office number was provided for follow-up of right ICA stent -continue to monitor BP at home -Highly encouraged smoking cessation along with continued avoidance of cocaine use -Recommended Tylenol as needed for knee pain and advised to avoid use of ibuprofen -advised to continue to stay active and maintain a healthy diet -Maintain strict control of hypertension with blood pressure goal below 130/90, diabetes with hemoglobin A1c goal below 6.5% and cholesterol with LDL cholesterol (bad cholesterol) goal below 70 mg/dL. I also advised the patient to eat a healthy diet with plenty of whole grains, cereals, fruits and vegetables, exercise regularly and maintain ideal body weight.  Follow up in 3 months or call earlier if needed   Greater than 50% of time during this 25 minute visit was spent on counseling,explanation of diagnosis of right MCA infarct, reviewing risk factor management of HTN, HLD, CAD, NSTEMI and right ICA stent, planning of further management, discussion with patient and family and coordination of care    George Hugh, AGNP-BC  [facility] [address] [place], [place] [address]  Phone [phone] Fax [phone] Note: This document was prepared with digital dictation and possible smart phrase technology. Any transcriptional errors that result from this process are unintentional.

## 2018-02-12 ENCOUNTER — Ambulatory Visit: Payer: Medicaid Other | Admitting: Adult Health

## 2018-02-12 ENCOUNTER — Encounter: Payer: Self-pay | Admitting: Adult Health

## 2018-02-13 ENCOUNTER — Telehealth: Payer: Self-pay

## 2018-02-13 NOTE — Telephone Encounter (Signed)
Patient no show for appt on 02/12/2018. 

## 2018-02-17 ENCOUNTER — Telehealth: Payer: Self-pay

## 2018-02-17 NOTE — Telephone Encounter (Signed)
I have attempted to contact this patient by phone about moving appointment due to [facility], PA-C being off 03/10/18. Pt phone number list is not a working number.

## 2018-02-28 ENCOUNTER — Telehealth: Payer: Self-pay

## 2018-02-28 NOTE — Telephone Encounter (Signed)
Call pt on phone number listed in chart but was no longer a working number. Spoke with sister, Nicole Cella list in contacts to obtain pt working number.  Pt sister made me aware that pt new phone is [phone]. Spoke with pt and made pt aware of appt time and date needed to be changed on 1-13 due to [facility], PA-C being out of the office that day. Rescheduled pt for 1/10 @ 11:45 am. Pt verbalized understanding and thanked me for the call. Phone number has been updated in Epic.

## 2018-03-07 ENCOUNTER — Ambulatory Visit: Payer: Medicaid Other | Admitting: Physician Assistant

## 2018-03-10 ENCOUNTER — Ambulatory Visit: Payer: Medicaid Other | Admitting: Physician Assistant

## 2018-03-26 ENCOUNTER — Encounter: Payer: Self-pay | Admitting: Physician Assistant

## 2018-03-26 ENCOUNTER — Telehealth: Payer: Self-pay | Admitting: Family Medicine

## 2018-03-26 ENCOUNTER — Encounter (INDEPENDENT_AMBULATORY_CARE_PROVIDER_SITE_OTHER): Payer: Self-pay

## 2018-03-26 ENCOUNTER — Other Ambulatory Visit (HOSPITAL_COMMUNITY): Payer: Self-pay | Admitting: Interventional Radiology

## 2018-03-26 ENCOUNTER — Ambulatory Visit (INDEPENDENT_AMBULATORY_CARE_PROVIDER_SITE_OTHER): Payer: Medicaid Other | Admitting: Physician Assistant

## 2018-03-26 VITALS — BP 136/60 | HR 65 | Ht 64.0 in | Wt 172.0 lb

## 2018-03-26 DIAGNOSIS — Z720 Tobacco use: Secondary | ICD-10-CM | POA: Diagnosis not present

## 2018-03-26 DIAGNOSIS — M1711 Unilateral primary osteoarthritis, right knee: Secondary | ICD-10-CM

## 2018-03-26 DIAGNOSIS — I10 Essential (primary) hypertension: Secondary | ICD-10-CM

## 2018-03-26 DIAGNOSIS — I2510 Atherosclerotic heart disease of native coronary artery without angina pectoris: Secondary | ICD-10-CM

## 2018-03-26 DIAGNOSIS — I639 Cerebral infarction, unspecified: Secondary | ICD-10-CM

## 2018-03-26 DIAGNOSIS — Z8673 Personal history of transient ischemic attack (TIA), and cerebral infarction without residual deficits: Secondary | ICD-10-CM

## 2018-03-26 DIAGNOSIS — E782 Mixed hyperlipidemia: Secondary | ICD-10-CM

## 2018-03-26 MED ORDER — TICAGRELOR 90 MG PO TABS: 90.0000 mg | ORAL_TABLET | Freq: Two times a day (BID) | ORAL | 11 refills | Status: DC

## 2018-03-26 MED ORDER — AMLODIPINE BESYLATE 2.5 MG PO TABS: 2.5000 mg | ORAL_TABLET | Freq: Every day | ORAL | 3 refills | Status: DC

## 2018-03-26 MED ORDER — ATORVASTATIN CALCIUM 80 MG PO TABS: 80.0000 mg | ORAL_TABLET | Freq: Every day | ORAL | 11 refills | Status: DC

## 2018-03-26 NOTE — Progress Notes (Signed)
Cardiology Office Note:    Date:  03/26/2018   ID:  Kevin Barton, DOB 09/24/1960, MRN [id number]  PCP:  Antony Blackbird, MD  Cardiologist:  Sherren Mocha, MD   Electrophysiologist:  None   Referring MD: Antony Blackbird, MD   Chief Complaint  Patient presents with  . Follow-up    CAD     History of Present Illness:    Kevin Barton is a 58 y.o. male with coronary artery disease with known total occlusion of the RCA with L-R collaterals treated medically, R MCA cerebrovascular accident in 09/2017 treated with tPA and endovascular intervention (R MCA and stenting to R ICA), polysubstance abuse (alcohol, cigarettes, cocaine).  Mr. Semper returns for follow up.  He is here alone.  He has been out of Atorvastatin and Ticagrelor for several months.  He lost his phone and was unable to request a refill. He is still taking ASA.  He has luckily not had a recurrent CVA.  He occasionally has some chest discomfort when he is smoking cigarettes.  However, this is rare and he does not have exertional chest pain when he is not smoking.  He has not had significant shortness of breath, paroxysmal nocturnal dyspnea, lower extremity swelling or syncope.  Prior CV studies:   The following studies were reviewed today:  Carotid US 10/07/17 Final Interpretation: Right Carotid: Right ICA stent <50% stenosis. Left Carotid: Velocities in the left ICA are consistent with a 1-39% stenosis. Vertebrals: Bilateral vertebral arteries demonstrate antegrade flow.  Echocardiogram 10/06/2017 Moderate LVH, EF 65-70, normal wall motion, grade 2 diastolic dysfunction, mild MAC, trivial MR, normal RV SF  Cardiac catheterization 07/25/2016 LAD mild disease RI mild disease LCx ostial 30 RCA mid 100 with L-R collaterals (first septal perforator) EF 45-50 1. Severe single vessel CAD with total occlusion of the RCA, collateralized from the left coronary artery 2. Mild nonobstructive plaque in the left main, LAD, and LCx 3.  Mild segmental LV dysfunction, LVEF 50% Recommend: medical therapy  Past Medical History:  Diagnosis Date  . Arthritis   . CAD (coronary artery disease)    cath 06/2016 occlusion of RCA with collateralization from the left coronary artery. Mild nonobstructive plaque in the left main, LAD, and LCx. LVEF 50%. TX Rx.   . Chronic back pain   . Chronic knee pain   . HTN (hypertension)   . NSTEMI (non-ST elevated myocardial infarction) ([place])   . Stroke ([place])   . Tobacco abuse    Surgical Hx: The patient  has a past surgical history that includes LEFT HEART CATH AND CORONARY ANGIOGRAPHY (N/A, 07/25/2016); Radiology with anesthesia (N/A, 10/05/2017); IR ANGIO INTRA EXTRACRAN SEL COM CAROTID INNOMINATE UNI L MOD SED (10/05/2017); IR ANGIO VERTEBRAL SEL SUBCLAVIAN INNOMINATE UNI R MOD SED (10/05/2017); IR PERCUTANEOUS ART THROMBECTOMY/INFUSION INTRACRANIAL INC DIAG ANGIO (10/05/2017); IR INTRAVSC STENT CERV CAROTID W/O EMB-PROT MOD SED (10/05/2017); and IR CT Head Ltd (10/05/2017).   Current Medications: Current Meds  Medication Sig  . aspirin EC 81 MG tablet Take 81 mg by mouth daily.  Marland Kitchen atorvastatin (LIPITOR) 80 MG tablet Take 1 tablet (80 mg total) by mouth daily at 6 PM.  . ticagrelor (BRILINTA) 90 MG TABS tablet Take 1 tablet (90 mg total) by mouth 2 (two) times daily.  . [DISCONTINUED] atorvastatin (LIPITOR) 80 MG tablet Take 1 tablet (80 mg total) by mouth daily at 6 PM.  . [DISCONTINUED] ticagrelor (BRILINTA) 90 MG TABS tablet Take 1 tablet (90 mg total) by mouth 2 (  two) times daily.     Allergies:   Patient has no known allergies.   Social History   Tobacco Use  . Smoking status: Current Every Day Smoker    Packs/day: 0.50    Types: Cigarettes  . Smokeless tobacco: Never Used  . Tobacco comment: smoke 6 per week  Substance Use Topics  . Alcohol use: Yes    Comment: a beer a day  . Drug use: No     Family Hx: The patient's family history includes Diabetes in his brother.  ROS:     Please see the history of present illness.    ROS All other systems reviewed and are negative.   EKGs/Labs/Other Test Reviewed:    EKG:  EKG is  ordered today.  The ekg ordered today demonstrates normal sinus rhythm, heart rate 65, normal axis, QTC 405  Recent Labs: 11/04/2017: ALT 19; BUN 12; Creatinine, Ser 1.16; Hemoglobin 10.1; Platelets 290; Potassium 4.4; Sodium 144   Recent Lipid Panel Lab Results  Component Value Date/Time   CHOL 193 10/06/2017 05:00 AM   CHOL 249 (H) 05/23/2017 12:04 PM   TRIG 111 10/06/2017 05:00 AM   HDL 59 10/06/2017 05:00 AM   HDL 82 05/23/2017 12:04 PM   CHOLHDL 3.3 10/06/2017 05:00 AM   LDLCALC 112 (H) 10/06/2017 05:00 AM   LDLCALC 154 (H) 05/23/2017 12:04 PM    Physical Exam:    VS:  BP 136/60   Pulse 65   Ht _0  (1.626 m)   Wt 172 lb (78 kg)   SpO2 99%   BMI 29.52 kg/m     Wt Readings from Last 3 Encounters:  03/26/18 172 lb (78 kg)  11/13/17 188 lb (85.3 kg)  11/07/17 188 lb 9.6 oz (85.5 kg)     Physical Exam  Constitutional: He is oriented to person, place, and time. He appears well-developed and well-nourished. No distress.  HENT:  Head: Normocephalic and atraumatic.  Eyes: No scleral icterus.  Neck: Neck supple. No JVD present. No thyromegaly present.  Cardiovascular: Normal rate, regular rhythm, S1 normal and S2 normal.  No murmur heard. Pulmonary/Chest: Breath sounds normal. He has no rales.  Abdominal: Soft. There is no abdominal tenderness.  Musculoskeletal:        General: No edema.  Lymphadenopathy:    He has no cervical adenopathy.  Neurological: He is alert and oriented to person, place, and time.  Skin: Skin is warm and dry.  Psychiatric: He has a normal mood and affect.    ASSESSMENT & PLAN:    Coronary artery disease involving native coronary artery of native heart without angina pectoris  Cardiac catheterization in May 2018 demonstrated a totally occluded RCA with left-to-right collaterals and mild  nonobstructive disease elsewhere.  He does have occasional anginal symptoms.  These are quite rare and seem to be associated with smoking cigarettes.  His blood pressure has increased over time.  He was previously on ACE inhibitor therapy.  However, given his symptoms, I think he would benefit from the addition of either long-acting nitrates or amlodipine.  Therefore, I recommended that we start him on amlodipine 2.5 mg daily.  He also needs to resume his high-dose statin therapy and continue aspirin.  -Refill atorvastatin 80 mg  -Start amlodipine 2.5 mg daily  -Continue aspirin  -Follow-up with me in 8 weeks  History of stroke Right MCA stroke in August 2019 which was treated with endovascular intervention of the right MCA as well as stenting of  an occluded right ICA.  He was discharged on dual antiplatelet therapy with ticagrelor and aspirin.  Unfortunately, he ran out of his ticagrelor several months ago.  He has continued on aspirin.  To avoid any further interruption in therapy, I will go ahead and refill his ticagrelor.  However, he will need to get future refills with his neurologist and/or interventional radiologist.  He has follow-up with neurology in about 2 weeks.  I have encouraged him to keep that appointment.  We will contact [facility] to try to arrange follow-up with Dr. Estanislado Pandy.  Essential hypertension  Blood pressure somewhat above target.  As noted, I will place him on amlodipine 2.5 mg daily.  Mixed hyperlipidemia Refill atorvastatin.  Arrange fasting CMET, lipids in 8 weeks.  Tobacco abuse I have encouraged him to quit smoking.  Osteoarthritis of right knee, unspecified osteoarthritis type He tells me that he may need knee replacement at some point in the future.  I am adjusting his therapy to treat angina as well as hypertension.  I can see him back in 8 weeks.  However, from a neurologic standpoint, he will need follow-up with neurology and interventional radiology to also  determine his risk for proceeding with knee replacement as well as to make recommendations for +/- holding Ticagrelor.   Dispo:  Return in about 8 weeks (around 05/21/2018) for Routine Follow Up, w/ Richardson Dopp, PA-C.   Medication Adjustments/Labs and Tests Ordered: Current medicines are reviewed at length with the patient today.  Concerns regarding medicines are outlined above.  Tests Ordered: Orders Placed This Encounter  Procedures  . Comprehensive metabolic panel  . Lipid panel  . EKG 12-Lead   Medication Changes: Meds ordered this encounter  Medications  . amLODipine (NORVASC) 2.5 MG tablet    Sig: Take 1 tablet (2.5 mg total) by mouth daily.    Dispense:  90 tablet    Refill:  3  . ticagrelor (BRILINTA) 90 MG TABS tablet    Sig: Take 1 tablet (90 mg total) by mouth 2 (two) times daily.    Dispense:  60 tablet    Refill:  11  . atorvastatin (LIPITOR) 80 MG tablet    Sig: Take 1 tablet (80 mg total) by mouth daily at 6 PM.    Dispense:  30 tablet    Refill:  [id number], Richardson Dopp, PA-C  03/26/2018 10:06 PM    [facility] [place], [place], [place]  [address] Phone: [phone]; Fax: [phone]

## 2018-03-26 NOTE — Telephone Encounter (Signed)
1) Medication(s) Requested (by name): brilinta atorvastatin 2) Pharmacy of Choice:  [facility] on [address]

## 2018-03-26 NOTE — Telephone Encounter (Signed)
Refills sent in by Cardiologist earlier today, no further action is required.

## 2018-03-26 NOTE — Patient Instructions (Addendum)
Medication Instructions:  Your physician has recommended you make the following change in your medication:  1. START AMLODIPINE 2.5 MG DAILY.  Refills have been sent in to your pharmacy for your Cardiac medications.  If you need a refill on your cardiac medications before your next appointment, please call your pharmacy.   Lab work: TO BE DONE IN ON 3/27 AFTER APPOINTMENT WITH SCOTT WEAVER, PA-C: FASTING CMET, LIPIDS  If you have labs (blood work) drawn today and your tests are completely normal, you will receive your results only by: Marland Kitchen MyChart Message (if you have MyChart) OR . A paper copy in the mail If you have any lab test that is abnormal or we need to change your treatment, we will call you to review the results.  Testing/Procedures: NONE  Follow-Up: At [facility], you and your health needs are our priority.  As part of our continuing mission to provide you with exceptional heart care, we have created designated Provider Care Teams.  These Care Teams include your primary Cardiologist (physician) and Advanced Practice Providers (APPs -  Physician Assistants and Nurse Practitioners) who all work together to provide you with the care you need, when you need it. . You have a follow up appointment with Tereso Newcomer, PA-C on 05-23-18 @ 10:45 am.  . Bonita Quin have follow up appointment with Dr. Fatima Sanger office on 04-01-18 @ 1 pm. Please go to the radiology department on the first floor.   Any Other Special Instructions Will Be Listed Below (If Applicable).

## 2018-03-27 ENCOUNTER — Ambulatory Visit: Payer: Medicaid Other | Admitting: Family Medicine

## 2018-04-01 ENCOUNTER — Ambulatory Visit (HOSPITAL_COMMUNITY): Admission: RE | Admit: 2018-04-01 | Payer: Medicaid Other | Source: Ambulatory Visit

## 2018-04-01 ENCOUNTER — Encounter (HOSPITAL_COMMUNITY): Payer: Self-pay

## 2018-04-01 HISTORY — DX: Hyperlipidemia, unspecified: E78.5

## 2018-04-08 ENCOUNTER — Telehealth: Payer: Self-pay

## 2018-04-08 ENCOUNTER — Ambulatory Visit: Payer: Medicaid Other | Admitting: Adult Health

## 2018-04-08 NOTE — Progress Notes (Deleted)
[facility] [address] [place]. [place] [address] [phone]       OFFICE FOLLOW UP NOTE  [name] Date of Birth:  07/23/1960 Medical Record Number:  [id number]   Reason for Referral:  hospital stroke follow up  CHIEF COMPLAINT:  No chief complaint on file.   HPI: 04/08/18 VISIT  Kevin Barton is being seen today for routine follow-up of right MCA infarct in 09/2017.  He did have prior appointment scheduled on 02/13/2018 but unfortunately was a no-show for appointment.  Overall, he has been stable from a stroke standpoint without residual deficits or recurring of symptoms.  He was seen in his cardiologist office on 03/26/2018 and per notes, he ran out of his Brilinta several months prior and did not attempt to request refill.  He has not scheduled follow-up appointment Dr. Fatima Sanger office as recommended.  Cardiologist refilled Brilinta at that time with recommending following up in this office or interventional radiology for further refills and again advised him to schedule appointment with Dr. Fatima Sanger office.  He currently has appointment scheduled on 04/10/2018 with IR.  He has continued on aspirin and restarted Brilinta without side effects of bleeding or bruising.  Continues on atorvastatin without side effects of myalgias.  Blood pressure today ***.  Recent addition of amlodipine by cardiology due to continued elevated BP.  He continues tobacco use but denies illicit drug use.  Denies new or worsening stroke/TIA symptoms.     HISTORY SUMMARY:  Kevin Barton is being seen today for initial visit in the office for right MCA infarct on 10/05/2017. History obtained from Kevin Barton and chart review. Reviewed all radiology images and labs personally.  Kevin Barton an 58 y.o.malewith PMH significant for HTN, CAD, NSTEMI ( 06/2016) who presented to ED with suddent onset of slurred speech, right gaze, left side flaccid, and neglecting left side.  CT head  reviewed and showed hyperdense right MCA consistent with thrombosis.  CTA head and neck showed right ICA occlusion from the bifurcation to the ICA terminus along with right M1 occlusion with some pial collateral flow distally.  Kevin Barton received IV TPA and then underwent endovascular treatment with TICI 3 reperfusion and right ICA stent.  MRI head reviewed and showed acute infarct in the right stratum and 2 punctate acute infarcts in the right parietal cortex.  MRA head showed patent bilateral MCAs.  2D echo showed an EF of 60 to 65%.  Carotid Doppler showed right ICA stent less than 50% stenosis and left ICA 1 to 39% stenosis.  UDS was positive for cocaine.  LDL 112 and his Kevin Barton was on Zocor 40 mg PTA is recommended to start Lipitor 80 mg daily.  A1c satisfactory at 5.5.  Kevin Barton was on aspirin 81 mg PTA and recommended aspirin 81 mg and Brilinta 90 mg twice daily.  Recommended for follow-up appointment with Dr. Corliss Skains 4 weeks status post stent placement.  Kevin Barton was educated on importance of stopping tobacco use, substance abuse and alcohol abuse.  Kevin Barton discharged home in satisfactory condition without therapy needs as he returned back to baseline.  Kevin Barton is being seen today for hospital follow-up and states overall he is been doing well.  Only complaint is right knee pain and believes that he has arthritis but does have an appointment with orthopedics tomorrow for this to be evaluated.  He continues to take both aspirin and Brilinta without side effects of bleeding or bruising.  Continues to take Lipitor without side effects of myalgias.  Blood pressure today satisfactory 127/73.  He did have follow-up appointment scheduled with Dr. Corliss Skainsdeveshwar on 11/08/2017 but per epic, Kevin Barton was a no-show.  Kevin Barton states that he was unaware of this appointment at that time due to numerous appointments that were made post hospital discharge was unable to keep track of all of them.  Kevin Barton plans on calling office to  schedule follow-up appointment.  Kevin Barton continues to smoke cigarettes but has greatly decreased the amount and plans on stopping completely.  He denies continued use of cocaine and also states that he has been trying to avoid being around known cocaine users.  Denies new or worsening stroke/TIA symptoms.    ROS:   14 system review of systems performed and negative with exception of knee pain  PMH:  Past Medical History:  Diagnosis Date  . Arthritis   . CAD (coronary artery disease)    cath 06/2016 occlusion of RCA with collateralization from the left coronary artery. Mild nonobstructive plaque in the left main, LAD, and LCx. LVEF 50%. TX Rx.   . Chronic back pain   . Chronic knee pain   . HTN (hypertension)   . Hyperlipidemia   . NSTEMI (non-ST elevated myocardial infarction) (HCC)   . Stroke (HCC)   . Tobacco abuse     PSH:  Past Surgical History:  Procedure Laterality Date  . IR ANGIO INTRA EXTRACRAN SEL COM CAROTID INNOMINATE UNI L MOD SED  10/05/2017  . IR ANGIO VERTEBRAL SEL SUBCLAVIAN INNOMINATE UNI R MOD SED  10/05/2017  . IR CT HEAD LTD  10/05/2017  . IR INTRAVSC STENT CERV CAROTID W/O EMB-PROT MOD SED INC ANGIO  10/05/2017  . IR PERCUTANEOUS ART THROMBECTOMY/INFUSION INTRACRANIAL INC DIAG ANGIO  10/05/2017  . LEFT HEART CATH AND CORONARY ANGIOGRAPHY N/A 07/25/2016   Procedure: Left Heart Cath and Coronary Angiography;  Surgeon: Tonny Bollmanooper, Michael, MD;  [facility];  Service: Cardiovascular;  Laterality: N/A;  . RADIOLOGY WITH ANESTHESIA N/A 10/05/2017   Procedure: IR WITH ANESTHESIA CODE STROKE;  Surgeon: Julieanne Cottoneveshwar, Sanjeev, MD;  Location: [facility] OR;  Service: Radiology;  Laterality: N/A;    Social History:  Social History   Socioeconomic History  . Marital status: Single    Spouse name: Not on file  . Number of children: Not on file  . Years of education: Not on file  . Highest education level: Not on file  Occupational History  . Not on file  Social Needs    . Financial resource strain: Not on file  . Food insecurity:    Worry: Not on file    Inability: Not on file  . Transportation needs:    Medical: Not on file    Non-medical: Not on file  Tobacco Use  . Smoking status: Current Every Day Smoker    Packs/day: 0.50    Types: Cigarettes  . Smokeless tobacco: Never Used  . Tobacco comment: smoke 6 per week  Substance and Sexual Activity  . Alcohol use: Yes    Comment: a beer a day  . Drug use: No  . Sexual activity: Yes  Lifestyle  . Physical activity:    Days per week: Not on file    Minutes per session: Not on file  . Stress: Not on file  Relationships  . Social connections:    Talks on phone: Not on file    Gets together: Not on file    Attends religious service: Not on file    Active member  of club or organization: Not on file    Attends meetings of clubs or organizations: Not on file    Relationship status: Not on file  . Intimate partner violence:    Fear of current or ex partner: Not on file    Emotionally abused: Not on file    Physically abused: Not on file    Forced sexual activity: Not on file  Other Topics Concern  . Not on file  Social History Narrative  . Not on file    Family History:  Family History  Problem Relation Age of Onset  . Diabetes Brother     Medications:   Current Outpatient Medications on File Prior to Visit  Medication Sig Dispense Refill  . amLODipine (NORVASC) 2.5 MG tablet Take 1 tablet (2.5 mg total) by mouth daily. 90 tablet 3  . aspirin EC 81 MG tablet Take 81 mg by mouth daily.    Marland Kitchen atorvastatin (LIPITOR) 80 MG tablet Take 1 tablet (80 mg total) by mouth daily at 6 PM. 30 tablet 11  . ticagrelor (BRILINTA) 90 MG TABS tablet Take 1 tablet (90 mg total) by mouth 2 (two) times daily. 60 tablet 11   No current facility-administered medications on file prior to visit.     Allergies:  No Known Allergies   Physical Exam  There were no vitals filed for this visit. There is no  height or weight on file to calculate BMI. No exam data present  General: well developed, well nourished, pleasant middle-aged African-American male, seated, in no evident distress Head: head normocephalic and atraumatic.   Neck: supple with no carotid or supraclavicular bruits Cardiovascular: regular rate and rhythm, no murmurs Musculoskeletal: no deformity Skin:  no rash/petichiae Vascular:  Normal pulses all extremities  Neurologic Exam Mental Status: Awake and fully alert. Oriented to place and time. Recent and remote memory intact. Attention span, concentration and fund of knowledge appropriate. Mood and affect appropriate.  Cranial Nerves: Fundoscopic exam reveals sharp disc margins. Pupils equal, briskly reactive to light. Extraocular movements full without nystagmus. Visual fields full to confrontation. Hearing intact. Facial sensation intact. Face, tongue, palate moves normally and symmetrically.  Motor: Normal bulk and tone. Normal strength in all tested extremity muscles except for very mild left upper extremity weakness  Sensory.: intact to touch , pinprick , position and vibratory sensation.  Coordination: Rapid alternating movements normal in all extremities. Finger-to-nose and heel-to-shin performed accurately bilaterally.  Orbits right arm over left arm Gait and Station: Arises from chair without difficulty. Stance is normal. Gait demonstrates normal stride length and balance except for mild limping on left leg due to knee pain. Able to heel, toe and tandem walk without difficulty.  Reflexes: 1+ and symmetric. Toes downgoing.    NIHSS  0 Modified Rankin  1   Diagnostic Data (Labs, Imaging, Testing)  CT HEAD WO CONTRAST 10/05/2017 IMPRESSION:  Hyperdense right MCA consistent with thrombosis in this setting.No acute hemorrhage. Aspects is 10.   Ct Angio Head W Or Wo Contrast Ct Angio Neck W And/or Wo Contrast 10/05/2017 IMPRESSION:  1.Right internal carotid artery  occluded from the bifurcation through the ICA terminus.  2. Retrograde flow into the right A1 segment via a patent anterior communicating artery. There is slight opacification into the ICA terminus.  3. Some right MCA branch vessels are reconstituted via pial collaterals.  4. Moderate proximal narrowing in the left M2 branch vessels without focal occlusion.  5. Mild distal PCA branch vessel irregularity without  a significant posterior circulation stenosis or occlusion. 6. Bilateral maxillary dental disease.  7. Edema or atelectasis throughout the lung apices.   Mr Brain Wo Contrast 10/06/2017 1. Acute infarct in the right stratum. Two punctate acute infarcts in the right parietal cortex. 2. 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 3. Significantly motion degraded.   MrMra HeadWo Contrast 10/06/2017 Severely motion degraded. There is diagnostic visualization of the bilateral M1 segments which are widely patent.   DSA 10/05/2017 S/P bilateral common carotid arteriogram,RT Vert angiogram followed by complete revascularization of RT [facility] occlusionwith x 1 pass with 79mm x 33 mm embotrap device achieving a TICI 3 reperfusionand revascularization of acutely occluded RT ICA proxwith stent assisted angioplasty.  Transthoracic Echocardiogram - Left ventricle: The cavity size was normal. Wall thickness wasincreased in a pattern of moderate LVH. Systolic function wasvigorous. The estimated ejection fraction was in the range of 65%to 70%. Wall motion was normal; there were no regional wallmotion abnormalities. Features are consistent with a pseudonormalleft ventricular filling pattern, with concomitant abnormalrelaxation and increased filling pressure (grade 2 diastolicdysfunction). - Aortic valve: There was no stenosis. - Mitral valve: Mildly calcified annulus. Mildly calcified leaflets. There was trivial regurgitation. - Right ventricle: The cavity size was normal. Systolic  function was normal. - Pulmonary arteries: No complete TR doppler jet so unabletoestimate PA systolic pressure. - Inferior vena cava: The vessel was normal in size. Therespirophasic diameter changes were in the normal range (>= 50%),consistent with normal central venous pressure. Impressions: Normal LV size with moderate LV hypertrophy. EF 60-65%. Moderatediastolic dysfunction. Normal RV size and systolic function.  Carotid Doppler   Right ICA stent patent withless than 50% stenosis. Left CCA defused hypoechoic irregular plaque appears in mid CCA with intimal thickening, Left ICA velocity classified as 1-39% according to the category of velocity. Bilateral vertebral arteries patent with antegrade flow.   ASSESSMENT: Asha Sears is a 58 y.o. 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 Vascular risk factors include HTN, HLD, CAD, NSTEMI, tobacco use and substance abuse.  Kevin Barton is being seen today for hospital follow-up and overall has been stable from a stroke standpoint with residual deficit of only mild left upper extremity weakness.    PLAN: -Continue aspirin 81 mg daily and Brilinta  and Lipitor 80 mg for secondary stroke prevention -F/u with PCP regarding your HLD and HTN management -Instructed Kevin Barton to contact Dr. Fatima Sanger office to schedule follow-up appointment that was previously missed in office number was provided for follow-up of right ICA stent -continue to monitor BP at home -Highly encouraged smoking cessation along with continued avoidance of cocaine use -Recommended Tylenol as needed for knee pain and advised to avoid use of ibuprofen -advised to continue to stay active and maintain a healthy diet -Maintain strict control of hypertension with blood pressure goal below 130/90, diabetes with hemoglobin A1c goal  below 6.5% and cholesterol with LDL cholesterol (bad cholesterol) goal below 70 mg/dL. I also advised the Kevin Barton to eat a healthy diet with plenty of whole grains, cereals, fruits and vegetables, exercise regularly and maintain ideal body weight.  Follow up in 3 months or call earlier if needed   Greater than 50% of time during this 25 minute visit was spent on counseling,explanation of diagnosis of right MCA infarct, reviewing risk factor management of HTN, HLD, CAD, NSTEMI and  right ICA stent, planning of further management, discussion with Kevin Barton and family and coordination of care    George Hugh, [facility]  [facility] [address] [place], [place] [address]  Phone [phone] Fax [phone] Note: This document was prepared with digital dictation and possible smart phrase technology. Any transcriptional errors that result from this process are unintentional.

## 2018-04-08 NOTE — Telephone Encounter (Signed)
Patient no show for appt today. 

## 2018-04-09 ENCOUNTER — Encounter: Payer: Self-pay | Admitting: Adult Health

## 2018-04-10 ENCOUNTER — Ambulatory Visit (HOSPITAL_COMMUNITY)
Admission: RE | Admit: 2018-04-10 | Discharge: 2018-04-10 | Disposition: A | Discharge status: Home / Self Care | Payer: Medicaid Other | Source: Ambulatory Visit | Attending: Interventional Radiology | Admitting: Interventional Radiology

## 2018-04-10 DIAGNOSIS — I639 Cerebral infarction, unspecified: Secondary | ICD-10-CM

## 2018-04-10 NOTE — Progress Notes (Signed)
Chief Complaint: Patient was seen in consultation today for follow up stroke intervention at the request of Deveshwar,Sanjeev  Referring Physician(s): Deveshwar,Sanjeev  Supervising Physician: Julieanne Cotton  Patient Status: [facility] - Out-pt  History of Present Illness: Kevin Barton is a 58 y.o. male who admitted CODE STROKE back in 09/2017. He underwent emergent cerebral angiogram with successful endovascular complete revascularization of occluded right middle cerebral artery M1 segment with 1 pass with a 36mm x 44mm Embotrap retrieval device achieving a reperfusion followed by endovascular revascularization of acutely occluded symptomatic right internal carotid artery proximally with stent assisted angioplasty. He has done very well since then. Remains on ASA and Brilinta as directed. He does continue to smoke about 4-6 cigarettes per day. 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 He denies sxs c/w TIA or stroke, speech is fine and coordination is good. He has had any follow up with [place] since the procedure and is here today to do so. He will likely need to come off the Brilinta if he proceeds with knee surgery.   Past Medical History:  Diagnosis Date  . Arthritis   . CAD (coronary artery disease)    cath 06/2016 occlusion of RCA with collateralization from the left coronary artery. Mild nonobstructive plaque in the left main, LAD, and LCx. LVEF 50%. TX Rx.   . Chronic back pain   . Chronic knee pain   . HTN (hypertension)   . Hyperlipidemia   . NSTEMI (non-ST elevated myocardial infarction) (HCC)   . Stroke (HCC)   . Tobacco abuse     Past Surgical History:  Procedure Laterality Date  . IR ANGIO INTRA EXTRACRAN SEL COM CAROTID INNOMINATE UNI L MOD SED  10/05/2017  . IR ANGIO VERTEBRAL SEL SUBCLAVIAN INNOMINATE UNI R MOD SED  10/05/2017  . IR CT HEAD LTD  10/05/2017  . IR  INTRAVSC STENT CERV CAROTID W/O EMB-PROT MOD SED INC ANGIO  10/05/2017  . IR PERCUTANEOUS ART THROMBECTOMY/INFUSION INTRACRANIAL INC DIAG ANGIO  10/05/2017  . LEFT HEART CATH AND CORONARY ANGIOGRAPHY N/A 07/25/2016   Procedure: Left Heart Cath and Coronary Angiography;  Surgeon: Tonny Bollman, MD;  Location: [facility] INVASIVE CV LAB;  Service: Cardiovascular;  Laterality: N/A;  . RADIOLOGY WITH ANESTHESIA N/A 10/05/2017   Procedure: IR WITH ANESTHESIA CODE STROKE;  Surgeon: Julieanne Cotton, MD;  Location: [facility] OR;  Service: Radiology;  Laterality: N/A;    Allergies: Patient has no known allergies.  Medications: Prior to Admission medications   Medication Sig Start Date End Date Taking? Authorizing Provider  amLODipine (NORVASC) 2.5 MG tablet Take 1 tablet (2.5 mg total) by mouth daily. 03/26/18 06/24/18  Tereso Newcomer T, PA-C  aspirin EC 81 MG tablet Take 81 mg by mouth daily.    [provider]  atorvastatin (LIPITOR) 80 MG tablet Take 1 tablet (80 mg total) by mouth daily at 6 PM. 03/26/18   Alben Spittle, Evern Bio, PA-C  ticagrelor (BRILINTA) 90 MG TABS tablet Take 1 tablet (90 mg total) by mouth 2 (two) times daily. 03/26/18   Beatrice Lecher, PA-C     Family History  Problem Relation Age of Onset  . Diabetes Brother     Social History   Socioeconomic History  . Marital status: Single    Spouse name: Not on file  . Number of children: Not on file  . Years of education: Not on  file  . Highest education level: Not on file  Occupational History  . Not on file  Social Needs  . Financial resource strain: Not on file  . Food insecurity:    Worry: Not on file    Inability: Not on file  . Transportation needs:    Medical: Not on file    Non-medical: Not on file  Tobacco Use  . Smoking status: Current Every Day Smoker    Packs/day: 0.50    Types: Cigarettes  . Smokeless tobacco: Never Used  . Tobacco comment: smoke 6 per week  Substance and Sexual Activity  . Alcohol use: Yes     Comment: a beer a day  . Drug use: No  . Sexual activity: Yes  Lifestyle  . Physical activity:    Days per week: Not on file    Minutes per session: Not on file  . Stress: Not on file  Relationships  . Social connections:    Talks on phone: Not on file    Gets together: Not on file    Attends religious service: Not on file    Active member of club or organization: Not on file    Attends meetings of clubs or organizations: Not on file    Relationship status: Not on file  Other Topics Concern  . Not on file  Social History Narrative  . Not on file    Review of Systems: A 12 point ROS discussed and pertinent positives are indicated in the HPI above.  All other systems are negative.  Review of Systems  Vital Signs: There were no vitals taken for this visit.  Physical Exam Constitutional:      Appearance: Normal appearance.  HENT:     Head: Normocephalic.  Neurological:     General: No focal deficit present.     Mental Status: He is alert and oriented to person, place, and time. Mental status is at baseline.     Cranial Nerves: No cranial nerve deficit.  Psychiatric:        Mood and Affect: Mood normal.        Judgment: Judgment normal.      Imaging: No results found.  Labs:  CBC: Recent Labs    10/06/17 0500 10/07/17 0708 10/08/17 0328 11/04/17 1039  WBC 8.5 7.2 7.5 5.8  HGB 11.0* 10.9* 11.3* 10.1*  HCT 32.5* 31.3* 33.2* 30.9*  PLT 182 169 180 290    COAGS: Recent Labs    10/05/17 1452  INR 1.09  APTT 32    BMP: Recent Labs    10/06/17 0500 10/07/17 0708 10/08/17 0328 11/04/17 1039  NA 139 139 140 144  K 3.5 3.7 3.5 4.4  CL 109 108 106 109*  CO2 21* 24 25 20   GLUCOSE 107* 110* 113* 91  BUN 7 6 6 12   CALCIUM 8.4* 8.5* 9.1 9.4  CREATININE 1.13 1.03 1.09 1.16  GFRNONAA >60 >60 >60 70  GFRAA >60 >60 >60 81    LIVER FUNCTION TESTS: Recent Labs    05/23/17 1204 10/05/17 1452 11/04/17 1039  BILITOT 0.5 0.6 <0.2  AST 28 24 26   ALT  20 17 19   ALKPHOS 74 64 77  PROT 7.6 7.1 7.4  ALBUMIN 4.2 3.7 4.5    TUMOR MARKERS: No results for input(s): AFPTM, CEA, CA199, CHROMGRNA in the last 8760 hours.  Assessment and Plan: S/p CVA with successful revasc and (R)ICA stenting in 09/2017. Remains on Brilinta and ASA We will get him  scheduled for Carotid duplex study. If it is stable, he may pursue knee surgery with clearance to hold Brilinta as recommended prior to surgery, however, he needs to stay on ASA daily no matter what. We will follow up with him after the results of his duplex study are complete.  Thank you for this interesting consult.  I greatly enjoyed meeting Allin Alesi and look forward to participating in their care.  A copy of this report was sent to the requesting provider on this date.  Electronically Signed: Brayton ElKevin Manolo Bosket, PA-C 04/10/2018, 2:18 PM   I spent a total of 30 minutes in face to face in clinical consultation, greater than 50% of which was counseling/coordinating care for carotid occlusive disease and stroke

## 2018-04-15 ENCOUNTER — Ambulatory Visit: Payer: Medicaid Other | Admitting: Family Medicine

## 2018-04-16 ENCOUNTER — Other Ambulatory Visit (HOSPITAL_COMMUNITY): Payer: Self-pay | Admitting: Interventional Radiology

## 2018-04-16 DIAGNOSIS — I639 Cerebral infarction, unspecified: Secondary | ICD-10-CM

## 2018-04-24 ENCOUNTER — Ambulatory Visit: Payer: Medicaid Other

## 2018-04-29 ENCOUNTER — Other Ambulatory Visit: Payer: Self-pay | Admitting: Cardiovascular Disease

## 2018-04-29 ENCOUNTER — Other Ambulatory Visit: Payer: Self-pay

## 2018-04-29 DIAGNOSIS — Z8673 Personal history of transient ischemic attack (TIA), and cerebral infarction without residual deficits: Secondary | ICD-10-CM

## 2018-04-29 NOTE — Telephone Encounter (Signed)
° ° ° °*  STAT* If patient is at the pharmacy, call can be transferred to refill team.   1. Which medications need to be refilled? (please list name of each medication and dose if known) ticagrelor (BRILINTA) 90 MG TABS tablet, atorvastatin (LIPITOR) 80 MG tablet 2. Which pharmacy/location (including street and city if local pharmacy) is medication to be sent to? [id number] - [place] ([place]), [place] - [address] 3. Do they need a 30 day or 90 day supply? 30

## 2018-04-29 NOTE — Telephone Encounter (Signed)
Called and spoke with Pharmacy. Both of these prescriptions were sent in Jan with several refills. They will refill for pt.

## 2018-05-08 ENCOUNTER — Ambulatory Visit (HOSPITAL_COMMUNITY)
Admission: RE | Admit: 2018-05-08 | Discharge: 2018-05-08 | Disposition: A | Discharge status: Home / Self Care | Payer: Medicaid Other | Source: Ambulatory Visit | Attending: Interventional Radiology | Admitting: Interventional Radiology

## 2018-05-08 ENCOUNTER — Other Ambulatory Visit: Payer: Self-pay

## 2018-05-08 DIAGNOSIS — I639 Cerebral infarction, unspecified: Secondary | ICD-10-CM | POA: Insufficient documentation

## 2018-05-08 NOTE — Progress Notes (Signed)
Bilateral carotid duplex completed. Preliminary results in Chart review CV Proc. [place] Reakwon Barren,RVS 05/08/2018 11:17 AM

## 2018-05-23 ENCOUNTER — Ambulatory Visit: Payer: Medicaid Other | Admitting: Physician Assistant

## 2018-05-23 ENCOUNTER — Other Ambulatory Visit: Payer: Medicaid Other

## 2018-06-04 ENCOUNTER — Encounter: Payer: Self-pay | Admitting: *Deleted

## 2018-06-04 ENCOUNTER — Telehealth: Payer: Self-pay | Admitting: *Deleted

## 2018-06-04 NOTE — Telephone Encounter (Signed)
SPOKE WITH PT TO R/S APPT TO OPTIONS OF TELEHEALTH OR IN OFFICE VISIT IN AUG.  PT STATES  ITS EARLY AND HES STILL TIRED EXPLAINED HE DOESN'T NOT HAVE AN BP CUFF  AND WOULD PERFER TO DUE IN MORNING IN OFFICE VISITS.  PT WAS CONCERNED WITH DOING LABS AND WHICH WAS EXPLAINED HE WAS STILL ABLE TO DO  PT DIDNT C/O OF ANY PAST OR CURRENT SOB CP AND DIZZY SPELLS. PT STATES HE'S BEEN DOING FINE.  PT STATED  H'ELL CALL BACK AND RESCHEDULE ON HIS OWN.

## 2018-06-04 NOTE — Telephone Encounter (Signed)
ERROR

## 2018-06-13 ENCOUNTER — Other Ambulatory Visit: Payer: Medicaid Other

## 2018-06-13 ENCOUNTER — Ambulatory Visit: Payer: Medicaid Other | Admitting: Physician Assistant

## 2018-06-23 ENCOUNTER — Telehealth: Payer: Self-pay

## 2018-06-23 NOTE — Telephone Encounter (Signed)
I called pt that our office is closed on fridays due to COVID 19. I stated his visit will be a video visit. 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 I stated it will be change to telephone visit. PT gave verbal consent to do telephone visit and to file insurance. I stated we are not seeing pts in the office at this time due to COVID 19. I stated he will have to answer his phone at 1045 for the telephone visit. Pt verbalized understanding. I updated his medication list.

## 2018-06-25 NOTE — Progress Notes (Signed)
[facility] [address] [place]. [place] [address] [phone]     Virtual Visit via Telephone Note  I connected with Kevin Barton on 06/25/18 at 10:45 AM EDT by telephone located remotely within my own home and verified that I am speaking with the correct person using two identifiers who reports being located within his own home.    I discussed the limitations, risks, security and privacy concerns of performing an evaluation and management service by telephone and the availability of in person appointments. I also discussed with the patient that there may be a patient responsible charge related to this service. The patient expressed understanding and agreed to proceed.   History of Present Illness:  Kevin Barton is a 58 y.o. male who has been followed in this office for right MCA infarct in 09/2017.  He was initially scheduled for face-to-face office stroke follow-up visit today but due to COVID19, face-to-face office visit rescheduled for non-face-to-face telephone visit with patient's consent.  He was last seen in office on 11/13/2017 and has not returned since with 2 no-shows for scheduled appointments.  He has been stable from a stroke standpoint without residual deficits or recurring of symptoms.  He continues on aspirin and Brilinta without side effects of bleeding or bruising.  He was evaluated by vascular surgery on 04/10/2018 and recommended continuation of Brilinta and aspirin.  He continues on atorvastatin 80 mg without side effects myalgias.  Blood pressure not monitored but states typically stable at provider appointments.  He continues tobacco use with approximately half pack or less per day.  Denies recreational drug use.  No further concerns at this time.     Observations/Objective:  General: well developed, well nourished, pleasant middle-aged African-American male, seated, in no evident distress Mental Status: Awake and fully alert. Oriented to place  and time. Recent and remote memory intact. Attention span, concentration and fund of knowledge appropriate. Mood and affect appropriate.   VAS US CAROTID DUPLEX BILATERAL 05/08/2018 Summary: Right Carotid: Patent ICA stent with <50% stenosis. There is no significant                change since exam of 10/09/2017. Left Carotid: Velocities in the left ICA are consistent with a 1-39% stenosis.               There is no significant change since exam of 10/09/2017. Vertebrals:  Bilateral vertebral arteries demonstrate antegrade flow. Subclavians: Normal flow hemodynamics were seen in bilateral subclavian              arteries.   Assessment and Plan: Kevin Barton is a 58 y.o. 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 Vascular risk factors include HTN, HLD, CAD, NSTEMI, tobacco use and substance abuse.   He has been stable from a stroke standpoint without residual deficits or recurring of symptoms.   -Continue aspirin 81 mg daily and Brilinta  and Lipitor 80 mg for secondary stroke prevention -Follow-up with vascular surgery as recommended for ongoing surveillance monitoring of right ICA stent and ongoing duration of aspirin 81 mg and Brilinta -F/u with PCP regarding your HLD and HTN management -Continue to stay active and maintain a healthy diet -Highly encouraged smoking cessation along with continued avoidance of cocaine use -advised to continue to stay active and maintain a healthy diet -Maintain strict control  of hypertension with blood pressure goal below 130/90, diabetes with hemoglobin A1c goal below 6.5% and cholesterol with LDL cholesterol (bad cholesterol) goal below 70 mg/dL. I also advised the patient to eat a healthy diet with plenty of whole grains, cereals, fruits and vegetables, exercise regularly and maintain ideal body weight.   Follow Up Instructions:  Stable from a stroke standpoint and recommend follow-up as needed or to call office with questions or concerns regarding stroke management    I discussed the assessment and treatment plan with the patient.  The patient was provided an opportunity to ask questions and all were answered to their satisfaction. The patient agreed with the plan and verbalized an understanding of the instructions.   I provided 22 minutes of non-face-to-face time during this encounter.    George HughJessica VanSchaick, AGNP-BC  [facility] [address] [place], [place] [id number]  Phone [phone] Fax [phone] Note: This document was prepared with digital dictation and possible smart phrase technology. Any transcriptional errors that result from this process are unintentional.

## 2018-06-26 ENCOUNTER — Encounter: Payer: Self-pay | Admitting: Adult Health

## 2018-06-26 ENCOUNTER — Other Ambulatory Visit: Payer: Self-pay

## 2018-06-26 ENCOUNTER — Ambulatory Visit (INDEPENDENT_AMBULATORY_CARE_PROVIDER_SITE_OTHER): Payer: Medicaid Other | Admitting: Adult Health

## 2018-06-26 DIAGNOSIS — E785 Hyperlipidemia, unspecified: Secondary | ICD-10-CM | POA: Diagnosis not present

## 2018-06-26 DIAGNOSIS — Z720 Tobacco use: Secondary | ICD-10-CM

## 2018-06-26 DIAGNOSIS — I63231 Cerebral infarction due to unspecified occlusion or stenosis of right carotid arteries: Secondary | ICD-10-CM

## 2018-06-26 DIAGNOSIS — I10 Essential (primary) hypertension: Secondary | ICD-10-CM | POA: Diagnosis not present

## 2018-06-26 DIAGNOSIS — I63511 Cerebral infarction due to unspecified occlusion or stenosis of right middle cerebral artery: Secondary | ICD-10-CM

## 2018-06-26 NOTE — Progress Notes (Signed)
I agree with the above plan 

## 2018-06-27 ENCOUNTER — Ambulatory Visit: Payer: Medicaid Other | Admitting: Adult Health

## 2018-07-11 ENCOUNTER — Encounter (HOSPITAL_COMMUNITY): Payer: Self-pay | Admitting: Emergency Medicine

## 2018-07-11 ENCOUNTER — Other Ambulatory Visit: Payer: Self-pay

## 2018-07-11 ENCOUNTER — Emergency Department (HOSPITAL_COMMUNITY)
Admission: EM | Admit: 2018-07-11 | Discharge: 2018-07-12 | Disposition: A | Discharge status: Home / Self Care | Payer: Medicaid Other | Attending: Emergency Medicine | Admitting: Emergency Medicine

## 2018-07-11 DIAGNOSIS — F17210 Nicotine dependence, cigarettes, uncomplicated: Secondary | ICD-10-CM | POA: Diagnosis not present

## 2018-07-11 DIAGNOSIS — I252 Old myocardial infarction: Secondary | ICD-10-CM | POA: Diagnosis not present

## 2018-07-11 DIAGNOSIS — F1910 Other psychoactive substance abuse, uncomplicated: Secondary | ICD-10-CM | POA: Diagnosis not present

## 2018-07-11 DIAGNOSIS — Z8673 Personal history of transient ischemic attack (TIA), and cerebral infarction without residual deficits: Secondary | ICD-10-CM | POA: Insufficient documentation

## 2018-07-11 DIAGNOSIS — Z79899 Other long term (current) drug therapy: Secondary | ICD-10-CM | POA: Insufficient documentation

## 2018-07-11 DIAGNOSIS — S0101XA Laceration without foreign body of scalp, initial encounter: Secondary | ICD-10-CM | POA: Diagnosis not present

## 2018-07-11 DIAGNOSIS — Z7982 Long term (current) use of aspirin: Secondary | ICD-10-CM | POA: Diagnosis not present

## 2018-07-11 DIAGNOSIS — W109XXA Fall (on) (from) unspecified stairs and steps, initial encounter: Secondary | ICD-10-CM | POA: Diagnosis not present

## 2018-07-11 DIAGNOSIS — S199XXA Unspecified injury of neck, initial encounter: Secondary | ICD-10-CM | POA: Diagnosis not present

## 2018-07-11 DIAGNOSIS — W19XXXA Unspecified fall, initial encounter: Secondary | ICD-10-CM

## 2018-07-11 DIAGNOSIS — I2510 Atherosclerotic heart disease of native coronary artery without angina pectoris: Secondary | ICD-10-CM | POA: Diagnosis not present

## 2018-07-11 DIAGNOSIS — S0003XA Contusion of scalp, initial encounter: Secondary | ICD-10-CM | POA: Diagnosis not present

## 2018-07-11 DIAGNOSIS — Y999 Unspecified external cause status: Secondary | ICD-10-CM | POA: Insufficient documentation

## 2018-07-11 DIAGNOSIS — I10 Essential (primary) hypertension: Secondary | ICD-10-CM | POA: Diagnosis not present

## 2018-07-11 DIAGNOSIS — Y929 Unspecified place or not applicable: Secondary | ICD-10-CM | POA: Insufficient documentation

## 2018-07-11 DIAGNOSIS — S0990XA Unspecified injury of head, initial encounter: Secondary | ICD-10-CM | POA: Diagnosis present

## 2018-07-11 DIAGNOSIS — Y9301 Activity, walking, marching and hiking: Secondary | ICD-10-CM | POA: Diagnosis not present

## 2018-07-11 MED ORDER — LORAZEPAM 2 MG/ML IJ SOLN
2.0000 mg | Freq: Once | INTRAMUSCULAR | Status: AC
  Administered 2018-07-11: 2 mg via INTRAMUSCULAR
  Filled 2018-07-11: qty 1

## 2018-07-11 NOTE — ED Notes (Signed)
Pt uncooperative, attempting to get out of bed.

## 2018-07-11 NOTE — ED Provider Notes (Signed)
[facility] Provider Note   CSN: [id number] Arrival date & time: 07/11/18  2312    History   Chief Complaint Chief Complaint  Patient presents with   Fall   Laceration    LEVEL 5 CAVEAT 2/2 AMS AND INTOXICATION  HPI Dolly RiasDarryl Trindade is a 58 y.o. male.    58 y/o male with hx of CAD (chronic RCA occlusion), HTN, MCA infarct s/p tPA and thrombectomy, cocaine and alcohol abuse presents to the emergency department following a fall.  Per family who reported to EMS, patient has been drinking alcohol tonight and fell down several flights of stairs.  LOC unknown.  Noted to have laceration to his posterior scalp.  Bleeding controlled prior to arrival.  He became increasingly combative with EMS requiring 5 mg IM Versed.  Chart documentation without documentation of chronic anticoagulation use.  The history is provided by the EMS personnel and medical records. No language interpreter was used.  Fall   Laceration    Past Medical History:  Diagnosis Date   Arthritis    CAD (coronary artery disease)    cath 06/2016 occlusion of RCA with collateralization from the left coronary artery. Mild nonobstructive plaque in the left main, LAD, and LCx. LVEF 50%. TX Rx.    Chronic back pain    Chronic knee pain    HTN (hypertension)    Hyperlipidemia    NSTEMI (non-ST elevated myocardial infarction) (HCC)    Stroke (HCC)    Tobacco abuse     Patient Active Problem List   Diagnosis Date Noted   Right knee pain 11/21/2017   Anemia 11/04/2017   Carotid artery occlusion with infarction (HCC) - R ICA s/p stent 10/08/2017   Cocaine use 10/08/2017   Intracranial atherosclerosis 10/08/2017   Bradycardia 10/08/2017   Alcohol use 10/08/2017   Obesity (BMI 30.0-34.9) 10/08/2017   Acute blood loss anemia 10/08/2017   Cerebral infarction due to stenosis of right middle cerebral artery (HCC) s/p tPA and mechanical thrombectomy 10/05/2017   CAD  (coronary artery disease) 04/28/2017   Essential (primary) hypertension 04/28/2017   Tobacco abuse 04/28/2017   Hyperlipidemia LDL goal <70 04/28/2017   NSTEMI (non-ST elevated myocardial infarction) [facility](HCC)     Past Surgical History:  Procedure Laterality Date   IR ANGIO INTRA EXTRACRAN SEL COM CAROTID INNOMINATE UNI L MOD SED  10/05/2017   IR ANGIO VERTEBRAL SEL SUBCLAVIAN INNOMINATE UNI R MOD SED  10/05/2017   IR CT HEAD LTD  10/05/2017   IR INTRAVSC STENT CERV CAROTID W/O EMB-PROT MOD SED INC ANGIO  10/05/2017   IR PERCUTANEOUS ART THROMBECTOMY/INFUSION INTRACRANIAL INC DIAG ANGIO  10/05/2017   LEFT HEART CATH AND CORONARY ANGIOGRAPHY N/A 07/25/2016   Procedure: Left Heart Cath and Coronary Angiography;  Surgeon: Tonny Bollmanooper, Michael, MD;  [facility];  Service: Cardiovascular;  Laterality: N/A;   RADIOLOGY WITH ANESTHESIA N/A 10/05/2017   Procedure: IR WITH ANESTHESIA CODE STROKE;  Surgeon: Julieanne Cottoneveshwar, Sanjeev, MD;  Location: [facility] OR;  Service: Radiology;  Laterality: N/A;        Home Medications    Prior to Admission medications   Medication Sig Start Date End Date Taking? Authorizing Provider  amLODipine (NORVASC) 2.5 MG tablet Take 1 tablet (2.5 mg total) by mouth daily. 03/26/18 06/24/18  Tereso NewcomerWeaver, Scott T, PA-C  aspirin EC 81 MG tablet Take 81 mg by mouth daily.    [provider]  atorvastatin (LIPITOR) 80 MG tablet Take 1 tablet (80  mg total) by mouth daily at 6 PM. 03/26/18   Tereso Newcomer T, PA-C  ticagrelor (BRILINTA) 90 MG TABS tablet Take 1 tablet (90 mg total) by mouth 2 (two) times daily. 03/26/18   Beatrice Lecher, PA-C    Family History Family History  Problem Relation Age of Onset   Diabetes Brother     Social History Social History   Tobacco Use   Smoking status: Current Every Day Smoker    Packs/day: 0.50    Types: Cigarettes   Smokeless tobacco: Never Used   Tobacco comment: smoke 6 per week  Substance Use Topics   Alcohol  use: Yes    Comment: a beer a day   Drug use: No     Allergies   Patient has no known allergies.   Review of Systems Review of Systems  Unable to perform ROS: Other  PATIENT INTOXICATED   Physical Exam Updated Vital Signs BP (!) 146/78    Pulse 88    Temp 98 F (36.7 C) (Oral)    Resp 19    SpO2 98%   Physical Exam Vitals signs and nursing note reviewed.  Constitutional:      General: He is not in acute distress.    Appearance: He is well-developed. He is not diaphoretic.  HENT:     Head: Normocephalic.      Comments: Hematoma to posterior scalp with overlying laceration. Bleeding controlled. No hemotympanum on the left; unable to visualize right TM.    Mouth/Throat:     Mouth: Mucous membranes are moist.  Eyes:     General: No scleral icterus.    Extraocular Movements: Extraocular movements intact.     Conjunctiva/sclera: Conjunctivae normal.     Pupils: Pupils are equal, round, and reactive to light.  Neck:     Musculoskeletal: Normal range of motion.     Comments: Freely moving neck Cardiovascular:     Rate and Rhythm: Normal rate and regular rhythm.     Pulses: Normal pulses.  Pulmonary:     Effort: Pulmonary effort is normal. No respiratory distress.     Comments: Respirations even and unlabored Musculoskeletal: Normal range of motion.  Skin:    General: Skin is warm and dry.     Coloration: Skin is not pale.     Findings: No erythema or rash.  Neurological:     Mental Status: He is alert.     Comments: GCS 15. Speech slurred. Patient moving all extremities vigorously. No obvious deficits, but unable to perform formal neurologic exam 2/2 cooperation and intoxication.  Psychiatric:        Speech: Speech is slurred.        Behavior: Behavior normal.      ED Treatments / Results  Labs (all labs ordered are listed, but only abnormal results are displayed) Labs Reviewed  CBC WITH DIFFERENTIAL/PLATELET - Abnormal; Notable for the following components:        Result Value   Hemoglobin 12.9 (*)    HCT 37.2 (*)    RDW 15.9 (*)    All other components within normal limits  COMPREHENSIVE METABOLIC PANEL - Abnormal; Notable for the following components:   Sodium 133 (*)    Potassium 3.3 (*)    Calcium 8.8 (*)    All other components within normal limits  ETHANOL - Abnormal; Notable for the following components:   Alcohol, Ethyl (B) 269 (*)    All other components within normal limits  RAPID URINE  DRUG SCREEN, HOSP PERFORMED - Abnormal; Notable for the following components:   Cocaine POSITIVE (*)    Benzodiazepines POSITIVE (*)    All other components within normal limits  PROTIME-INR    EKG None  Radiology Ct Head Wo Contrast  Result Date: 07/12/2018 CLINICAL DATA:  Fall down stairs. Head trauma, minor, GCS>=13, high clinical risk, initial exam EXAM: CT HEAD WITHOUT CONTRAST CT CERVICAL SPINE WITHOUT CONTRAST TECHNIQUE: Multidetector CT imaging of the head and cervical spine was performed following the standard protocol without intravenous contrast. Multiplanar CT image reconstructions of the cervical spine were also generated. COMPARISON:  Head CT 10/05/2017 FINDINGS: CT HEAD FINDINGS Brain: No intracranial hemorrhage, mass effect, or midline shift. No hydrocephalus. The basilar cisterns are patent. Remote infarct in right basal ganglia. No evidence of territorial infarct or acute ischemia. No extra-axial or intracranial fluid collection. Vascular: Hyperdense right MCA which is chronic, not as prominent as on prior exam. Skull: No fracture or focal lesion. Sinuses/Orbits: Dysconjugate gaze. Periodontal disease with scattered mucosal thickening of the paranasal sinuses. Mastoid air cells are clear. Other: Midline posterior parietal scalp hematoma. CT CERVICAL SPINE FINDINGS Alignment: Normal. Skull base and vertebrae: No acute fracture. Vertebral body heights are maintained. The dens and skull base are intact. Soft tissues and spinal canal: No  prevertebral fluid or swelling. No visible canal hematoma. Disc levels: Minor endplate spurring and disc space narrowing C5-C6. Upper chest: No acute findings. Other: Right carotid stent. Left carotid calcifications. IMPRESSION: 1. Midline posterior parietal scalp hematoma. No acute intracranial abnormality. No skull fracture. 2. No fracture or subluxation of the cervical spine. Electronically Signed   By: Narda Rutherford M.D.   On: 07/12/2018 01:33   Ct Cervical Spine Wo Contrast  Result Date: 07/12/2018 CLINICAL DATA:  Fall down stairs. Head trauma, minor, GCS>=13, high clinical risk, initial exam EXAM: CT HEAD WITHOUT CONTRAST CT CERVICAL SPINE WITHOUT CONTRAST TECHNIQUE: Multidetector CT imaging of the head and cervical spine was performed following the standard protocol without intravenous contrast. Multiplanar CT image reconstructions of the cervical spine were also generated. COMPARISON:  Head CT 10/05/2017 FINDINGS: CT HEAD FINDINGS Brain: No intracranial hemorrhage, mass effect, or midline shift. No hydrocephalus. The basilar cisterns are patent. Remote infarct in right basal ganglia. No evidence of territorial infarct or acute ischemia. No extra-axial or intracranial fluid collection. Vascular: Hyperdense right MCA which is chronic, not as prominent as on prior exam. Skull: No fracture or focal lesion. Sinuses/Orbits: Dysconjugate gaze. Periodontal disease with scattered mucosal thickening of the paranasal sinuses. Mastoid air cells are clear. Other: Midline posterior parietal scalp hematoma. CT CERVICAL SPINE FINDINGS Alignment: Normal. Skull base and vertebrae: No acute fracture. Vertebral body heights are maintained. The dens and skull base are intact. Soft tissues and spinal canal: No prevertebral fluid or swelling. No visible canal hematoma. Disc levels: Minor endplate spurring and disc space narrowing C5-C6. Upper chest: No acute findings. Other: Right carotid stent. Left carotid calcifications.  IMPRESSION: 1. Midline posterior parietal scalp hematoma. No acute intracranial abnormality. No skull fracture. 2. No fracture or subluxation of the cervical spine. Electronically Signed   By: Narda Rutherford M.D.   On: 07/12/2018 01:33    Procedures Procedures (including critical care time)  Medications Ordered in ED Medications  LORazepam (ATIVAN) injection 2 mg (2 mg Intramuscular Given 07/11/18 2335)    1:57 AM Went to fix patient's wound with staplers. Patient yelling and aggressive. Adamant that he does not watch staples or stitches. Explained that he  will likely have a large scar if wound not closed. Patient pushing at my hand and arm when I attempt to reevaluate his wound; will not allow me to touch him or assess his laceration. States that we are holding him here against his will. Speech slightly slurred, but more articulate than arrival. Security and [employer] at bedside given combative nature for patient and staff safety.  2:45 AM Patient continues to be loud and combative with staff.  3:00 AM Patient discharged in stable condition in the custody of [employer].   Initial Impression / Assessment and Plan / ED Course  I have reviewed the triage vital signs and the nursing notes.  Pertinent labs & imaging results that were available during my care of the patient were reviewed by me and considered in my medical decision making (see chart for details).        58 year old male presents to the emergency department following a fall at home.  He is clinically intoxicated on arrival with word speech.  Required Versed in route with EMS for combative nature.  He was further sedated with Ativan on arrival to allow for completion of CT scans and lab work.  CAT scan shows posterior scalp hematoma which is mild.  There is an overlying laceration without significant bleeding.  Wound is slightly gaping, though patient will not allow me to repair it with staples.  His laboratory evaluation is reassuring  other than his alcohol level of 269.  He is positive for cocaine today as well.  The patient has been observed in the emergency department for sobering, but is adamant on discharge.  He has been verbally aggressive with staff requiring security at bedside.  The patient was discharged in the custody of [employer] in stable condition.   Final Clinical Impressions(s) / ED Diagnoses   Final diagnoses:  Polysubstance abuse (HCC)  Laceration of scalp, initial encounter  Fall, initial encounter    ED Discharge Orders    None       Antony Madura, PA-C 07/12/18 0324    Nira Conn, MD 07/13/18 [phone]

## 2018-07-11 NOTE — ED Triage Notes (Signed)
Pt BIB [facility], family reports pt has been drinking ETOH tonight and fell down several flights of stairs. Pt has laceration to back of head as well as a hematoma. Bleeding controlled at this time. Pt became combative with EMS, given 5mg  versed IM PTA. EMS VS: BP- 152/98, CBG-89

## 2018-07-12 ENCOUNTER — Emergency Department (HOSPITAL_COMMUNITY): Payer: Medicaid Other

## 2018-07-12 DIAGNOSIS — S0003XA Contusion of scalp, initial encounter: Secondary | ICD-10-CM | POA: Diagnosis not present

## 2018-07-12 DIAGNOSIS — S199XXA Unspecified injury of neck, initial encounter: Secondary | ICD-10-CM | POA: Diagnosis not present

## 2018-07-12 LAB — CBC WITH DIFFERENTIAL/PLATELET
Abs Immature Granulocytes: 0.01 10*3/uL (ref 0.00–0.07)
Basophils Absolute: 0.1 10*3/uL (ref 0.0–0.1)
Basophils Relative: 1 %
Eosinophils Absolute: 0.3 10*3/uL (ref 0.0–0.5)
Eosinophils Relative: 4 %
HCT: 37.2 % — ABNORMAL LOW (ref 39.0–52.0)
Hemoglobin: 12.9 g/dL — ABNORMAL LOW (ref 13.0–17.0)
Immature Granulocytes: 0 %
Lymphocytes Relative: 42 %
Lymphs Abs: 2.8 10*3/uL (ref 0.7–4.0)
MCH: 28.4 pg (ref 26.0–34.0)
MCHC: 34.7 g/dL (ref 30.0–36.0)
MCV: 81.9 fL (ref 80.0–100.0)
Monocytes Absolute: 0.7 10*3/uL (ref 0.1–1.0)
Monocytes Relative: 10 %
Neutro Abs: 2.9 10*3/uL (ref 1.7–7.7)
Neutrophils Relative %: 43 %
Platelets: 274 10*3/uL (ref 150–400)
RBC: 4.54 MIL/uL (ref 4.22–5.81)
RDW: 15.9 % — ABNORMAL HIGH (ref 11.5–15.5)
WBC: 6.7 10*3/uL (ref 4.0–10.5)
nRBC: 0 % (ref 0.0–0.2)

## 2018-07-12 LAB — RAPID URINE DRUG SCREEN, HOSP PERFORMED
Amphetamines: NOT DETECTED
Barbiturates: NOT DETECTED
Benzodiazepines: POSITIVE — AB
Cocaine: POSITIVE — AB
Opiates: NOT DETECTED
Tetrahydrocannabinol: NOT DETECTED

## 2018-07-12 LAB — COMPREHENSIVE METABOLIC PANEL
ALT: 19 U/L (ref 0–44)
AST: 30 U/L (ref 15–41)
Albumin: 3.8 g/dL (ref 3.5–5.0)
Alkaline Phosphatase: 63 U/L (ref 38–126)
Anion gap: 10 (ref 5–15)
BUN: 7 mg/dL (ref 6–20)
CO2: 23 mmol/L (ref 22–32)
Calcium: 8.8 mg/dL — ABNORMAL LOW (ref 8.9–10.3)
Chloride: 100 mmol/L (ref 98–111)
Creatinine, Ser: 0.98 mg/dL (ref 0.61–1.24)
GFR calc Af Amer: >60 mL/min (ref >60)
GFR calc non Af Amer: >60 mL/min (ref >60)
Glucose, Bld: 98 mg/dL (ref 70–99)
Potassium: 3.3 mmol/L — ABNORMAL LOW (ref 3.5–5.1)
Sodium: 133 mmol/L — ABNORMAL LOW (ref 135–145)
Total Bilirubin: 0.4 mg/dL (ref 0.3–1.2)
Total Protein: 7.4 g/dL (ref 6.5–8.1)

## 2018-07-12 LAB — PROTIME-INR
INR: 1 (ref 0.8–1.2)
Prothrombin Time: 13.5 seconds (ref 11.4–15.2)

## 2018-07-12 LAB — ETHANOL: Alcohol, Ethyl (B): 269 mg/dL — ABNORMAL HIGH (ref <10)

## 2018-07-12 NOTE — ED Notes (Signed)
Patient transported to CT 

## 2018-07-12 NOTE — ED Notes (Signed)
Pt continuing to yell and be combative with staff. [employer] and security back at bedside.

## 2018-07-12 NOTE — ED Notes (Signed)
Spoke with pt's sister and she is unable to pick pt up at this time.

## 2018-07-12 NOTE — ED Notes (Signed)
Pt becoming agitated and combative, cursing and spitting at staff. [employer] and security at bedside.

## 2018-07-12 NOTE — Discharge Instructions (Signed)
We recommend that you discontinue excessive use of illicit drugs as well as alcohol.  Continue your daily prescribed medications.  You elected not to have your laceration on your scalp repaired.  This wound will heal on its own, but should remain covered over the next few days.  You will likely have a scar in this area.  Take Tylenol or ibuprofen as needed for any residual headache.  Follow-up with your primary care doctor.

## 2018-07-12 NOTE — ED Notes (Signed)
Pt discharged home with [employer], refused discharge vitals.

## 2018-07-12 NOTE — ED Notes (Signed)
Pt resting comfortably at this time.

## 2018-08-19 ENCOUNTER — Other Ambulatory Visit: Payer: Medicaid Other

## 2018-12-22 ENCOUNTER — Telehealth (HOSPITAL_COMMUNITY): Payer: Self-pay

## 2018-12-22 NOTE — Telephone Encounter (Signed)
Called to schedule us carotid, no answer, no vm. AW 

## 2019-01-14 ENCOUNTER — Telehealth (HOSPITAL_COMMUNITY): Payer: Self-pay

## 2019-01-14 NOTE — Telephone Encounter (Signed)
Called to schedule us carotid, no answer, no vm. AW 

## 2019-01-28 ENCOUNTER — Telehealth (HOSPITAL_COMMUNITY): Payer: Self-pay

## 2019-01-28 NOTE — Telephone Encounter (Signed)
Called to schedule us carotid, no answer, no vm. AW 

## 2019-04-28 DIAGNOSIS — H1013 Acute atopic conjunctivitis, bilateral: Secondary | ICD-10-CM | POA: Diagnosis not present

## 2019-07-25 DIAGNOSIS — Z03818 Encounter for observation for suspected exposure to other biological agents ruled out: Secondary | ICD-10-CM | POA: Diagnosis not present

## 2019-08-07 DIAGNOSIS — Z03818 Encounter for observation for suspected exposure to other biological agents ruled out: Secondary | ICD-10-CM | POA: Diagnosis not present

## 2019-08-09 ENCOUNTER — Other Ambulatory Visit: Payer: Self-pay

## 2019-08-09 ENCOUNTER — Encounter (HOSPITAL_COMMUNITY): Payer: Self-pay | Admitting: Emergency Medicine

## 2019-08-09 ENCOUNTER — Emergency Department (HOSPITAL_COMMUNITY)
Admission: EM | Admit: 2019-08-09 | Discharge: 2019-08-09 | Disposition: A | Discharge status: Home / Self Care | Payer: Medicaid Other | Attending: Emergency Medicine | Admitting: Emergency Medicine

## 2019-08-09 ENCOUNTER — Emergency Department (HOSPITAL_COMMUNITY): Payer: Medicaid Other

## 2019-08-09 DIAGNOSIS — I252 Old myocardial infarction: Secondary | ICD-10-CM | POA: Diagnosis not present

## 2019-08-09 DIAGNOSIS — Z955 Presence of coronary angioplasty implant and graft: Secondary | ICD-10-CM | POA: Insufficient documentation

## 2019-08-09 DIAGNOSIS — F17210 Nicotine dependence, cigarettes, uncomplicated: Secondary | ICD-10-CM | POA: Diagnosis not present

## 2019-08-09 DIAGNOSIS — M25561 Pain in right knee: Secondary | ICD-10-CM | POA: Diagnosis not present

## 2019-08-09 DIAGNOSIS — M25461 Effusion, right knee: Secondary | ICD-10-CM | POA: Insufficient documentation

## 2019-08-09 DIAGNOSIS — Z79899 Other long term (current) drug therapy: Secondary | ICD-10-CM | POA: Insufficient documentation

## 2019-08-09 DIAGNOSIS — R52 Pain, unspecified: Secondary | ICD-10-CM | POA: Diagnosis not present

## 2019-08-09 DIAGNOSIS — M1711 Unilateral primary osteoarthritis, right knee: Secondary | ICD-10-CM | POA: Diagnosis not present

## 2019-08-09 DIAGNOSIS — Z7982 Long term (current) use of aspirin: Secondary | ICD-10-CM | POA: Diagnosis not present

## 2019-08-09 DIAGNOSIS — I10 Essential (primary) hypertension: Secondary | ICD-10-CM | POA: Insufficient documentation

## 2019-08-09 DIAGNOSIS — I2510 Atherosclerotic heart disease of native coronary artery without angina pectoris: Secondary | ICD-10-CM | POA: Diagnosis not present

## 2019-08-09 MED ORDER — IBUPROFEN 400 MG PO TABS
600.0000 mg | ORAL_TABLET | Freq: Once | ORAL | Status: AC
  Administered 2019-08-09: 600 mg via ORAL
  Filled 2019-08-09: qty 1

## 2019-08-09 NOTE — ED Provider Notes (Signed)
[facility] EMERGENCY DEPARTMENT Provider Note   CSN: [id number] Arrival date & time: 08/09/19  0235     History Chief Complaint  Patient presents with  . Knee Pain  . Elbow Pain    Kevin Barton is a 59 y.o. male with PMHx HTN, HLD, CAD s/p stent, and arthritis who presents to the ED today with complaint of acute on chronic R knee pain.  Patient reports the pain has been bothering him on and off for 20 years.  He states that last year he saw an orthopedist who advised total knee replacement however "Covid happened and I did not go back."  Patient denies any recent injury or trauma however states that it has been bothering him more increasingly as of late.  He has not taking any medications for pain.  He also mentions that he has not been on any of his daily medications for approximately 3 years which includes Norvasc, Lipitor, Brilinta.  Patient states this is due to "hard headedness." He has no other complaints today.   The history is provided by the patient and medical records.       Past Medical History:  Diagnosis Date  . Arthritis   . CAD (coronary artery disease)    cath 06/2016 occlusion of RCA with collateralization from the left coronary artery. Mild nonobstructive plaque in the left main, LAD, and LCx. LVEF 50%. TX Rx.   . Chronic back pain   . Chronic knee pain   . HTN (hypertension)   . Hyperlipidemia   . NSTEMI (non-ST elevated myocardial infarction) (HCC)   . Stroke (HCC)   . Tobacco abuse     Patient Active Problem List   Diagnosis Date Noted  . Right knee pain 11/21/2017  . Anemia 11/04/2017  . Carotid artery occlusion with infarction (HCC) - R ICA s/p stent 10/08/2017  . Cocaine use 10/08/2017  . Intracranial atherosclerosis 10/08/2017  . Bradycardia 10/08/2017  . Alcohol use 10/08/2017  . Obesity (BMI 30.0-34.9) 10/08/2017  . Acute blood loss anemia 10/08/2017  . 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 CAD (coronary artery disease) 04/28/2017  . Essential (primary) hypertension 04/28/2017  . Tobacco abuse 04/28/2017  . Hyperlipidemia LDL goal <70 04/28/2017  . NSTEMI (non-ST elevated myocardial infarction) [facility])     Past Surgical History:  Procedure Laterality Date  . IR ANGIO INTRA EXTRACRAN SEL COM CAROTID INNOMINATE UNI L MOD SED  10/05/2017  . IR ANGIO VERTEBRAL SEL SUBCLAVIAN INNOMINATE UNI R MOD SED  10/05/2017  . IR CT HEAD LTD  10/05/2017  . IR INTRAVSC STENT CERV CAROTID W/O EMB-PROT MOD SED INC ANGIO  10/05/2017  . IR PERCUTANEOUS ART THROMBECTOMY/INFUSION INTRACRANIAL INC DIAG ANGIO  10/05/2017  . LEFT HEART CATH AND CORONARY ANGIOGRAPHY N/A 07/25/2016   Procedure: Left Heart Cath and Coronary Angiography;  Surgeon: Tonny Bollman, MD;  Location: [facility] INVASIVE CV LAB;  Service: Cardiovascular;  Laterality: N/A;  . RADIOLOGY WITH ANESTHESIA N/A 10/05/2017   Procedure: IR WITH ANESTHESIA CODE STROKE;  Surgeon: Julieanne Cotton, MD;  Location: [facility] OR;  Service: Radiology;  Laterality: N/A;       Family History  Problem Relation Age of Onset  . Diabetes Brother     Social History   Tobacco Use  . Smoking status: Current Every Day Smoker    Packs/day: 0.50    Types: Cigarettes  . Smokeless tobacco: Never Used  .  Tobacco comment: smoke 6 per week  Vaping Use  . Vaping Use: Never used  Substance Use Topics  . Alcohol use: Yes    Comment: a beer a day  . Drug use: No    Home Medications Prior to Admission medications   Medication Sig Start Date End Date Taking? Authorizing Provider  amLODipine (NORVASC) 2.5 MG tablet Take 1 tablet (2.5 mg total) by mouth daily. 03/26/18 06/24/18  Richardson Dopp T, PA-C  aspirin EC 81 MG tablet Take 81 mg by mouth daily.    [provider]  atorvastatin (LIPITOR) 80 MG tablet Take 1 tablet (80 mg total) by mouth daily at 6 PM. 03/26/18   Kathlen Mody, Blair Dolphin, PA-C  ticagrelor  (BRILINTA) 90 MG TABS tablet Take 1 tablet (90 mg total) by mouth 2 (two) times daily. 03/26/18   Richardson Dopp T, PA-C    Allergies    Patient has no known allergies.  Review of Systems   Review of Systems  Constitutional: Negative for chills and fever.  Musculoskeletal: Positive for arthralgias.    Physical Exam Updated Vital Signs BP 116/66   Pulse 75   Temp 98.4 F (36.9 C) (Oral)   Resp 14   Ht 5\' 8"  (1.727 m)   Wt 75 kg   SpO2 97%   BMI 25.14 kg/m   Physical Exam Vitals and nursing note reviewed.  Constitutional:      Appearance: He is not ill-appearing.     Comments: Disheveled apperance  HENT:     Head: Normocephalic and atraumatic.  Eyes:     Conjunctiva/sclera: Conjunctivae normal.  Cardiovascular:     Rate and Rhythm: Normal rate and regular rhythm.     Pulses: Normal pulses.  Pulmonary:     Effort: Pulmonary effort is normal.     Breath sounds: Normal breath sounds. No wheezing, rhonchi or rales.  Musculoskeletal:     Comments: No overlying skin changes to R knee including erythema or increased warmth. Effusion appreciated to lateral aspect with TTP. ROM limited s/2 pain. Strength 5/5 with flexion and extension. Sensation intact throughout. Negative anterior and posterior drawer test. No varus or valgus laxity appreciated. 2+ DP pulses.   Skin:    General: Skin is warm and dry.     Coloration: Skin is not jaundiced.  Neurological:     Mental Status: He is alert.     ED Results / Procedures / Treatments   Labs (all labs ordered are listed, but only abnormal results are displayed) Labs Reviewed - No data to display  EKG None  Radiology DG Knee Complete 4 Views Right  Result Date: 08/09/2019 CLINICAL DATA:  Knee pain for 20 years that is worse today. No known injury EXAM: RIGHT KNEE - COMPLETE 4+ VIEW COMPARISON:  11/21/2017 FINDINGS: Advanced osteoarthritis with marginal spurring and lateral compartment predominant narrowing. A large joint  effusion is present. On the lateral view there is evidence of an anterior joint line ossified body. Atherosclerosis IMPRESSION: 1. Knee osteoarthritis that is particularly advanced at the narrowed lateral compartment. 2. Large joint effusion. Electronically Signed   By: Monte Fantasia M.D.   On: 08/09/2019 07:37    Procedures Procedures (including critical care time)  Medications Ordered in ED Medications  ibuprofen (ADVIL) tablet 600 mg (has no administration in time range)    ED Course  I have reviewed the triage vital signs and the nursing notes.  Pertinent labs & imaging results that were available during my care of  the patient were reviewed by me and considered in my medical decision making (see chart for details).    MDM Rules/Calculators/A&P                          59 year old male who presents to the ED today with complaint of acute on chronic right knee pain, no recent injury or trauma however states it has been bothering as of late.  Not taken any medications for pain.  States he saw an orthopedist last year who recommended total knee replacement however he never went back.  On arrival to the ED patient is afebrile, nontachycardic and nontachypneic.  He appears to be in no acute distress.  He does appear disheveled.  Per triage report he is from local motel.  He has an obvious joint effusion to the right knee with tenderness to palpation, no overlying skin changes including increased warmth or erythema.  I have low suspicion for septic arthritis as well as gout. Will obtain x-ray given effusion and reevaluate.  Ibuprofen given for pain.  Xray of the knee with arthritis changes as well as large joint effusion. Given pt is well appearing without concern for possible septic joint I do not think pt requires arthrocentesis today. Will apply knee immobilizer and provide crutches. It does appear pt saw Dr. Roda Shutters in 2019 for similar complaints with plan for possible knee replacement surgery  however he had recently suffered a stroke at that time and was not a great candidate for surgery for at least 6 months. He was advised to follow with his cardiologist and neurologist to seek clearance for surgery. I suspect given the fact that pt has been off of his medications that he will need to be restarted and then cleared after several months. Pt strongly encouraged to follow up with his regular doctors to start back on his medications. Ibuprofen and Tylenol PRN for pain. Pt is in agreement with plan and stable for discharge home.   This note was prepared using Dragon voice recognition software and may include unintentional dictation errors due to the inherent limitations of voice recognition software.  Final Clinical Impression(s) / ED Diagnoses Final diagnoses:  Acute pain of right knee  Effusion of right knee    Rx / DC Orders ED Discharge Orders    None       Discharge Instructions     Please use crutches as needed. Wear knee immobilizer for comfort. Take Ibuprofen and Tylenol as needed for pain.   Follow up with Dr. Roda Shutters [facility] for further evaluation. Please follow up with your neurologist and cardiologist to restart medications.   Return to the ED for any worsening symptoms.       [name], PA-C 08/09/19 0745    Margarita Grizzle, MD 08/09/19 [phone]

## 2019-08-09 NOTE — Discharge Instructions (Signed)
Please use crutches as needed. Wear knee immobilizer for comfort. Take Ibuprofen and Tylenol as needed for pain.   Follow up with Dr. Roda Shutters [facility] for further evaluation. Please follow up with your neurologist and cardiologist to restart medications.   Return to the ED for any worsening symptoms.

## 2019-08-09 NOTE — ED Notes (Signed)
Paged ortho tech 

## 2019-08-09 NOTE — ED Triage Notes (Signed)
Patient arrived with EMS from a local motel reports chronic arthritic right knee and right elbow pain for 20 years worse today , denies recent injury /ambulatory .

## 2019-08-09 NOTE — Progress Notes (Signed)
Orthopedic Tech Progress Note Patient Details:  Kevin Barton [birth year] [id number]  Ortho Devices Type of Ortho Device: Knee Immobilizer Ortho Device/Splint Location: LLE Ortho Device/Splint Interventions: Application, Ordered   Post Interventions Patient Tolerated: Well Instructions Provided: Care of device   Kevin Barton 08/09/2019, 9:37 AM

## 2019-08-14 ENCOUNTER — Ambulatory Visit (INDEPENDENT_AMBULATORY_CARE_PROVIDER_SITE_OTHER): Payer: Medicaid Other

## 2019-08-14 ENCOUNTER — Encounter: Payer: Self-pay | Admitting: Orthopaedic Surgery

## 2019-08-14 ENCOUNTER — Ambulatory Visit (INDEPENDENT_AMBULATORY_CARE_PROVIDER_SITE_OTHER): Payer: Medicaid Other | Admitting: Orthopaedic Surgery

## 2019-08-14 VITALS — Ht 66.0 in | Wt 168.8 lb

## 2019-08-14 DIAGNOSIS — M1711 Unilateral primary osteoarthritis, right knee: Secondary | ICD-10-CM | POA: Insufficient documentation

## 2019-08-14 NOTE — Progress Notes (Signed)
Office Visit Note   Patient: Kevin Barton           Date of Birth: 03-15-1960           MRN: [id number] Visit Date: 08/14/2019              Requested by: Antony Blackbird, MD [place],  [place] [id number] PCP: Antony Blackbird, MD   Assessment & Plan: Visit Diagnoses:  1. Primary osteoarthritis of right knee     Plan: Impression is end-stage right knee DJD with valgus deformity and slight flexion contracture.  At this point Kevin Barton has elected to proceed with scheduling for right total knee replacement given the failure of conservative treatment.  Based on our discussion risk benefits rehab and recovery he is agreeable to going forward with the surgery.  We will obtain preoperative clearance from his PCP and cardiologist prior to surgery.  He will need to discontinue Brilinta 7 days in advance of surgery.  We will contact the patient once we have the proper clearances for surgery.  Hinged knee brace provided today to give him support.  Follow-Up Instructions: Return if symptoms worsen or fail to improve.   Orders:  Orders Placed This Encounter  Procedures   XR KNEE 3 VIEW RIGHT   No orders of the defined types were placed in this encounter.     Procedures: No procedures performed   Clinical Data: No additional findings.   Subjective: Chief Complaint  Patient presents with   Right Knee - Pain    Kevin Barton is a 59 year old gentleman who is well-known to me who comes in for chronic right knee pain.  He has had chronic right knee pain for many years and he has had significant difficulty with walking due to the valgus deformity.  He has undergone extensive conservative treatment in terms of cortisone injections, activity restriction, over-the-counter medications, knee brace, cane.  These have all been unsuccessful in alleviating his pain.  He is interested in undergoing knee replacement.  He lives alone.  He does have a history of stroke and an MI with cardiac stents.  He  is currently taking Brilinta.  He has a past history of cocaine use.  He has significant pain and swelling and decreased range of motion.   Review of Systems  Constitutional: Negative.   All other systems reviewed and are negative.    Objective: Vital Signs: Ht 5\' 6"  (1.676 m)    Wt 168 lb 12.8 oz (76.6 kg)    BMI 27.25 kg/m   Physical Exam Vitals and nursing note reviewed.  Constitutional:      Appearance: He is well-developed.  HENT:     Head: Normocephalic and atraumatic.  Eyes:     Pupils: Pupils are equal, round, and reactive to light.  Pulmonary:     Effort: Pulmonary effort is normal.  Abdominal:     Palpations: Abdomen is soft.  Musculoskeletal:        General: Normal range of motion.     Cervical back: Neck supple.  Skin:    General: Skin is warm.  Neurological:     Mental Status: He is alert and oriented to person, place, and time.  Psychiatric:        Behavior: Behavior normal.        Thought Content: Thought content normal.        Judgment: Judgment normal.     Ortho Exam Right knee shows a valgus deformity with painful range of  motion that is moderately limited.  Significant patellofemoral crepitus. Specialty Comments:  No specialty comments available.  Imaging: XR KNEE 3 VIEW RIGHT  Result Date: 08/14/2019 Advanced tricompartmental DJD with valgus deformity    PMFS History: Patient Active Problem List   Diagnosis Date Noted   Primary osteoarthritis of right knee 08/14/2019   Right knee pain 11/21/2017   Anemia 11/04/2017   Carotid artery occlusion with infarction (HCC) - R ICA s/p stent 10/08/2017   Cocaine use 10/08/2017   Intracranial atherosclerosis 10/08/2017   Bradycardia 10/08/2017   Alcohol use 10/08/2017   Obesity (BMI 30.0-34.9) 10/08/2017   Acute blood loss anemia 10/08/2017   Cerebral infarction due to stenosis of right middle cerebral artery (HCC) s/p tPA and mechanical thrombectomy 10/05/2017   CAD (coronary  artery disease) 04/28/2017   Essential (primary) hypertension 04/28/2017   Tobacco abuse 04/28/2017   Hyperlipidemia LDL goal <70 04/28/2017   NSTEMI (non-ST elevated myocardial infarction) [facility])    Past Medical History:  Diagnosis Date   Arthritis    CAD (coronary artery disease)    cath 06/2016 occlusion of RCA with collateralization from the left coronary artery. Mild nonobstructive plaque in the left main, LAD, and LCx. LVEF 50%. TX Rx.    Chronic back pain    Chronic knee pain    HTN (hypertension)    Hyperlipidemia    NSTEMI (non-ST elevated myocardial infarction) (HCC)    Stroke (HCC)    Tobacco abuse     Family History  Problem Relation Age of Onset   Diabetes Brother     Past Surgical History:  Procedure Laterality Date   IR ANGIO INTRA EXTRACRAN SEL COM CAROTID INNOMINATE UNI L MOD SED  10/05/2017   IR ANGIO VERTEBRAL SEL SUBCLAVIAN INNOMINATE UNI R MOD SED  10/05/2017   IR CT HEAD LTD  10/05/2017   IR INTRAVSC STENT CERV CAROTID W/O EMB-PROT MOD SED INC ANGIO  10/05/2017   IR PERCUTANEOUS ART THROMBECTOMY/INFUSION INTRACRANIAL INC DIAG ANGIO  10/05/2017   LEFT HEART CATH AND CORONARY ANGIOGRAPHY N/A 07/25/2016   Procedure: Left Heart Cath and Coronary Angiography;  Surgeon: Tonny Bollman, MD;  Location: [facility] INVASIVE CV LAB;  Service: Cardiovascular;  Laterality: N/A;   RADIOLOGY WITH ANESTHESIA N/A 10/05/2017   Procedure: IR WITH ANESTHESIA CODE STROKE;  Surgeon: Julieanne Cotton, MD;  Location: [facility] OR;  Service: Radiology;  Laterality: N/A;   Social History   Occupational History   Not on file  Tobacco Use   Smoking status: Current Every Day Smoker    Packs/day: 0.50    Types: Cigarettes   Smokeless tobacco: Never Used   Tobacco comment: smoke 6 per week  Vaping Use   Vaping Use: Never used  Substance and Sexual Activity   Alcohol use: Yes    Comment: a beer a day   Drug use: No   Sexual activity: Yes

## 2019-08-17 ENCOUNTER — Telehealth: Payer: Self-pay | Admitting: *Deleted

## 2019-08-17 NOTE — Telephone Encounter (Signed)
   Primary Cardiologist:Michael Excell Seltzer, MD  Chart reviewed as part of pre-operative protocol coverage. Because of Jarrette Taranto's past medical history and time since last visit, they will require a follow-up visit in order to better assess preoperative cardiovascular risk.  Pre-op covering staff: - Please schedule appointment and call patient to inform them. If patient already had an upcoming appointment within acceptable timeframe, please add "pre-op clearance" to the appointment notes so provider is aware. - Please contact requesting surgeon's office via preferred method (i.e, phone, fax) to inform them of need for appointment prior to surgery.   [place], [place]  08/17/2019, 12:06 PM

## 2019-08-17 NOTE — Telephone Encounter (Signed)
    [facility] Pre-operative Risk Assessment    HEARTCARE STAFF: - Please ensure there is not already an duplicate clearance open for this procedure. - Under Visit Info/Reason for Call, type in Other and utilize the format Clearance MM/DD/YY or Clearance TBD. Do not use dashes or single digits. - If request is for dental extraction, please clarify the # of teeth to be extracted.  Request for surgical clearance:  1. What type of surgery is being performed? RIGHT TOTAL KNEE   2. When is this surgery scheduled? TBD   3. What type of clearance is required (medical clearance vs. Pharmacy clearance to hold med vs. Both)? MEDICAL  4. Are there any medications that need to be held prior to surgery and how long? ASA and BRILINTA   5. Practice name and name of physician performing surgery? [facility]; [facility]   6. What is the office phone number? [phone]   7.   What is the office fax number? [phone]  8.   Anesthesia type (None, local, MAC, general) ? SPINAL   Julaine Hua 08/17/2019, 10:25 AM  _________________________________________________________________   (provider comments below)

## 2019-08-17 NOTE — Telephone Encounter (Signed)
Pt has is agreeable to pre op appt. Pt has been scheduled on 7/21 @ 9:45 with Tereso Newcomer, PAC. I offered pt if I could get him sooner at our [facility] office pt declined and said no he wants to be seen by Dr. Excell Seltzer. Pt is aware though that he is going to be seeing Tereso Newcomer, [facility] 7/21 @ 9:45 same day Dr. Excell Seltzer is in the office. Pt is grateful for the appt and the help. I will forward clearance notes to Tereso Newcomer, [facility] for appt. I will send FYI to surgeon office in regards to pt has appt 09/16/19 with cardiologist. I will remove from the pre op call back.

## 2019-08-21 DIAGNOSIS — Z03818 Encounter for observation for suspected exposure to other biological agents ruled out: Secondary | ICD-10-CM | POA: Diagnosis not present

## 2019-09-03 ENCOUNTER — Inpatient Hospital Stay: Payer: Medicaid Other

## 2019-09-10 ENCOUNTER — Encounter: Payer: Self-pay | Admitting: Internal Medicine

## 2019-09-10 ENCOUNTER — Other Ambulatory Visit: Payer: Self-pay

## 2019-09-10 ENCOUNTER — Ambulatory Visit: Payer: Medicaid Other | Attending: Internal Medicine | Admitting: Internal Medicine

## 2019-09-10 VITALS — BP 154/85 | HR 62 | Resp 16 | Ht 64.0 in | Wt 174.2 lb

## 2019-09-10 DIAGNOSIS — I2510 Atherosclerotic heart disease of native coronary artery without angina pectoris: Secondary | ICD-10-CM

## 2019-09-10 DIAGNOSIS — Z01818 Encounter for other preprocedural examination: Secondary | ICD-10-CM | POA: Diagnosis not present

## 2019-09-10 DIAGNOSIS — F1910 Other psychoactive substance abuse, uncomplicated: Secondary | ICD-10-CM

## 2019-09-10 DIAGNOSIS — F17200 Nicotine dependence, unspecified, uncomplicated: Secondary | ICD-10-CM

## 2019-09-10 DIAGNOSIS — Z8679 Personal history of other diseases of the circulatory system: Secondary | ICD-10-CM

## 2019-09-10 DIAGNOSIS — I10 Essential (primary) hypertension: Secondary | ICD-10-CM | POA: Diagnosis not present

## 2019-09-10 DIAGNOSIS — Z8673 Personal history of transient ischemic attack (TIA), and cerebral infarction without residual deficits: Secondary | ICD-10-CM

## 2019-09-10 MED ORDER — CHANTIX STARTING MONTH PAK 0.5 MG X 11 & 1 MG X 42 PO TABS: ORAL_TABLET | ORAL | 0 refills | Status: DC

## 2019-09-10 MED ORDER — AMLODIPINE BESYLATE 5 MG PO TABS: 5.0000 mg | ORAL_TABLET | Freq: Every day | ORAL | 3 refills | Status: DC

## 2019-09-10 MED ORDER — ATORVASTATIN CALCIUM 80 MG PO TABS: 80.0000 mg | ORAL_TABLET | Freq: Every day | ORAL | 4 refills | Status: DC

## 2019-09-10 MED ORDER — ASPIRIN EC 81 MG PO TBEC: 81.0000 mg | DELAYED_RELEASE_TABLET | Freq: Every day | ORAL | 1 refills | Status: DC

## 2019-09-10 NOTE — Progress Notes (Signed)
Patient ID: Kevin Barton, male    DOB: Jun 10, 1960  MRN: [id number]  CC: surgical clearnace   Subjective: Kevin Barton is a 59 y.o. male who presents for pre-op eval for RT TKR.  PCP is Dr. Jillyn HiddenFulp who is currently on leave. His concerns today include:  HTN, CAD, HL,  OA knee, anemia, CVA RT MCA infarct 2019, RT ICA stent, tob dep  Patient has been seeing Dr. Roda ShuttersXu for significant osteoarthritis in the right knee.  Plan is for total knee replacement under spinal anesthesia. No prevous surgeries reported. Patient with significant cardiovascular history including CAD, history of CVA with no residual weakness, right ICA stenosis status post stent.  Patient has been noncompliant with follow-up and has been out of all of his medicines for over a year.  No CP.  Some SOB with exertion "but it only happens when I smoke a cigarette."  No HA or dizziness Smoking 3 cigarettes a day. Wants to quit.   Not walking much due to his knee.  He has an appointment later this month with a cardiologist to also get preoperative evaluation/clearance from them.  Substance Abuse: Reports being clean of cocaine x 3 mths.  He attributes this to change of scenery and friends.  Drinks 1-2 non-alcoholic beer a day.  Patient Active Problem List   Diagnosis Date Noted  . Primary osteoarthritis of right knee 08/14/2019  . Right knee pain 11/21/2017  . Anemia 11/04/2017  . Carotid artery occlusion with infarction (HCC) - R ICA s/p stent 10/08/2017  . Cocaine use 10/08/2017  . Intracranial atherosclerosis 10/08/2017  . Bradycardia 10/08/2017  . Alcohol use 10/08/2017  . Obesity (BMI 30.0-34.9) 10/08/2017  . Acute blood loss anemia 10/08/2017  . 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 CAD (coronary artery disease) 04/28/2017  . Essential (primary) hypertension 04/28/2017  . Tobacco abuse 04/28/2017  . Hyperlipidemia LDL goal <70  04/28/2017  . NSTEMI (non-ST elevated myocardial infarction) [facility](HCC)      Current Outpatient Medications on File Prior to Visit  Medication Sig Dispense Refill  . amLODipine (NORVASC) 2.5 MG tablet Take 1 tablet (2.5 mg total) by mouth daily. 90 tablet 3  . aspirin EC 81 MG tablet Take 81 mg by mouth daily.    Marland Kitchen. atorvastatin (LIPITOR) 80 MG tablet Take 1 tablet (80 mg total) by mouth daily at 6 PM. 30 tablet 11  . ticagrelor (BRILINTA) 90 MG TABS tablet Take 1 tablet (90 mg total) by mouth 2 (two) times daily. 60 tablet 11   No current facility-administered medications on file prior to visit.    No Known Allergies  Social History   Socioeconomic History  . Marital status: Single    Spouse name: Not on file  . Number of children: Not on file  . Years of education: Not on file  . Highest education level: Not on file  Occupational History  . Not on file  Tobacco Use  . Smoking status: Current Every Day Smoker    Packs/day: 0.50    Types: Cigarettes  . Smokeless tobacco: Never Used  . Tobacco comment: smoke 6 per week  Vaping Use  . Vaping Use: Never used  Substance and Sexual Activity  . Alcohol use: Yes    Comment: a beer a day  . Drug use: No  . Sexual activity: Yes  Other Topics Concern  . Not on file  Social History Narrative  .  Not on file   Social Determinants of Health   Financial Resource Strain:   . Difficulty of Paying Living Expenses:   Food Insecurity:   . Worried About Programme researcher, broadcasting/film/video in the Last Year:   . Barista in the Last Year:   Transportation Needs:   . Freight forwarder (Medical):   Marland Kitchen Lack of Transportation (Non-Medical):   Physical Activity:   . Days of Exercise per Week:   . Minutes of Exercise per Session:   Stress:   . Feeling of Stress :   Social Connections:   . Frequency of Communication with Friends and Family:   . Frequency of Social Gatherings with Friends and Family:   . Attends Religious Services:   . Active  Member of Clubs or Organizations:   . Attends Banker Meetings:   Marland Kitchen Marital Status:   Intimate Partner Violence:   . Fear of Current or Ex-Partner:   . Emotionally Abused:   Marland Kitchen Physically Abused:   . Sexually Abused:     Family History  Problem Relation Age of Onset  . Diabetes Brother     Past Surgical History:  Procedure Laterality Date  . IR ANGIO INTRA EXTRACRAN SEL COM CAROTID INNOMINATE UNI L MOD SED  10/05/2017  . IR ANGIO VERTEBRAL SEL SUBCLAVIAN INNOMINATE UNI R MOD SED  10/05/2017  . IR CT HEAD LTD  10/05/2017  . IR INTRAVSC STENT CERV CAROTID W/O EMB-PROT MOD SED INC ANGIO  10/05/2017  . IR PERCUTANEOUS ART THROMBECTOMY/INFUSION INTRACRANIAL INC DIAG ANGIO  10/05/2017  . LEFT HEART CATH AND CORONARY ANGIOGRAPHY N/A 07/25/2016   Procedure: Left Heart Cath and Coronary Angiography;  Surgeon: Tonny Bollman, MD;  Location: [facility] INVASIVE CV LAB;  Service: Cardiovascular;  Laterality: N/A;  . RADIOLOGY WITH ANESTHESIA N/A 10/05/2017   Procedure: IR WITH ANESTHESIA CODE STROKE;  Surgeon: Julieanne Cotton, MD;  Location: [facility] OR;  Service: Radiology;  Laterality: N/A;    ROS: Review of Systems Negative except as stated above  PHYSICAL EXAM: BP (!) 154/85   Pulse 62   Resp 16   Ht 5\' 4"  (1.626 m)   Wt 174 lb 3.2 oz (79 kg)   SpO2 98%   BMI 29.90 kg/m   Wt Readings from Last 3 Encounters:  09/10/19 174 lb 3.2 oz (79 kg)  08/14/19 168 lb 12.8 oz (76.6 kg)  08/09/19 165 lb 5.5 oz (75 kg)    Physical Exam General appearance - alert, well appearing, middle-age to older African-American male and in no distress Mental status - normal mood, behavior, speech, dress, motor activity, and thought processes Eyes - pupils equal and reactive, extraocular eye movements intact Nose - normal and patent, no erythema, discharge or polyps Mouth - mucous membranes moist, pharynx normal without lesions Neck - supple, no significant adenopathy Chest - clear to auscultation, no  wheezes, rales or rhonchi, symmetric air entry Heart - normal rate, regular rhythm, normal S1, S2, no murmurs, rubs, clicks or gallops Musculoskeletal -he is wearing a brace on his right knee. Extremities -no lower extremity edema Depression screen [facility] 2/9 09/10/2019 11/04/2017  Decreased Interest 0 0  Down, Depressed, Hopeless 0 0  PHQ - 2 Score 0 0  Altered sleeping - 0  Tired, decreased energy - 0  Change in appetite - 0  Feeling bad or failure about yourself  - 0  Trouble concentrating - 0  Moving slowly or fidgety/restless - 0  Suicidal thoughts -  0  PHQ-9 Score - 0    CMP Latest Ref Rng & Units 07/12/2018 11/04/2017 10/08/2017  Glucose 70 - 99 mg/dL 98 91 564(P)  BUN 6 - 20 mg/dL 7 12 6   Creatinine 0.61 - 1.24 mg/dL 3.29 5.18  Sodium 135 - 145 mmol/L 133(L) 144 140  Potassium 3.5 - 5.1 mmol/L 3.3(L) 4.4 3.5  Chloride 98 - 111 mmol/L 100 109(H) 106  CO2 22 - 32 mmol/L 23 20 25   Calcium 8.9 - 10.3 mg/dL 8.41) 9.4 9.1  Total Protein 6.5 - 8.1 g/dL 7.4 7.4 -  Total Bilirubin 0.3 - 1.2 mg/dL 0.4 -  Alkaline Phos 38 - 126 U/L 63 77 -  AST 15 - 41 U/L 30 26 -  ALT 0 - 44 U/L 19 19 -   Lipid Panel     Component Value Date/Time   CHOL 193 10/06/2017 0500   CHOL 249 (H) 05/23/2017 1204   TRIG 111 10/06/2017 0500   HDL 59 10/06/2017 0500   HDL 82 05/23/2017 1204   CHOLHDL 3.3 10/06/2017 0500   VLDL 22 10/06/2017 0500   LDLCALC 112 (H) 10/06/2017 0500   LDLCALC 154 (H) 05/23/2017 1204    CBC    Component Value Date/Time   WBC 6.7 07/12/2018 0010   RBC 4.54 07/12/2018 0010   HGB 12.9 (L) 07/12/2018 0010   HGB 10.1 (L) 11/04/2017 1039   HCT 37.2 (L) 07/12/2018 0010   HCT 30.9 (L) 11/04/2017 1039   PLT 274 07/12/2018 0010   PLT 290 11/04/2017 1039   MCV 81.9 07/12/2018 0010   MCV 91 11/04/2017 1039   MCH 28.4 07/12/2018 0010   MCHC 34.7 07/12/2018 0010   RDW 15.9 (H) 07/12/2018 0010   RDW 14.1 11/04/2017 1039   LYMPHSABS 2.8 07/12/2018 0010   LYMPHSABS 1.9  11/04/2017 1039   MONOABS 0.7 07/12/2018 0010   EOSABS 0.3 07/12/2018 0010   EOSABS 0.4 11/04/2017 1039   BASOSABS 0.1 07/12/2018 0010   BASOSABS 0.0 11/04/2017 1039    ASSESSMENT AND PLAN: 1. Preoperative examination Patient at increased risks of cardiovascular events due to noncompliance with medications, uncontrolled blood pressure, ongoing tobacco use and history of substance abuse (for which he reports he is in remission currently).  Unable to accurately determine his METs as he has limited mobility due to knee pain.  He will keep his appointment with the cardiologist later this month for preoperative assessment.  In the meantime I have restarted his medications including amlodipine, atorvastatin and aspirin.  Advised patient that he should remain free of street drug use and heavy alcohol use prior to his surgery.  He expressed understanding.  Once cleared by cardiology, we can proceed with surgery  2. Essential (primary) hypertension Restart amlodipine.  DASH diet discussed and encourage - CBC - Comprehensive metabolic panel - Lipid panel - amLODipine (NORVASC) 5 MG tablet; Take 1 tablet (5 mg total) by mouth daily.  Dispense: 90 tablet; Refill: 3  3. Coronary artery disease involving native coronary artery of native heart without angina pectoris Currently asymptomatic - atorvastatin (LIPITOR) 80 MG tablet; Take 1 tablet (80 mg total) by mouth daily at 6 PM.  Dispense: 30 tablet; Refill: 4 - aspirin EC 81 MG tablet; Take 1 tablet (81 mg total) by mouth daily.  Dispense: 90 tablet; Refill: 1  4. History of CVA (cerebrovascular accident) without residual deficits - aspirin EC 81 MG tablet; Take 1 tablet (81 mg total) by mouth daily.  Dispense:  90 tablet; Refill: 1  5. Polysubstance abuse (HCC) See #1 above.  6. Tobacco dependence Discussed and encouraged smoking cessation.  Discussed health risks associated with smoking.  Patient wanting to try Chantix.  I went over possible side  effects of Chantix including bad dreams and mood swings. - varenicline (CHANTIX STARTING MONTH PAK) 0.5 MG X 11 & 1 MG X 42 tablet; Take one 0.5 mg tablet by mouth once daily for 3 days, then increase to one 0.5 mg tablet twice daily for 4 days, then increase to one 1 mg tablet twice daily.  Dispense: 53 tablet; Refill: 0  7. History of carotid artery stenosis Restart aspirin and atorvastatin.     Patient was given the opportunity to ask questions.  Patient verbalized understanding of the plan and was able to repeat key elements of the plan.   No orders of the defined types were placed in this encounter.    Requested Prescriptions    No prescriptions requested or ordered in this encounter    No follow-ups on file.  Jonah Blue, MD, FACP

## 2019-09-10 NOTE — Patient Instructions (Signed)
I have sent prescription for your blood pressure medication called amlodipine.  I have also sent your other medications including aspirin and Lipitor.  Try to quit smoking before having his surgery done.  You need to remain free of all street drugs for at least a month prior to your surgery.  Keep follow-up appointment with the cardiologist later this month.

## 2019-09-11 LAB — COMPREHENSIVE METABOLIC PANEL
ALT: 18 IU/L (ref 0–44)
AST: 24 IU/L (ref 0–40)
Albumin/Globulin Ratio: 1.3 (ref 1.2–2.2)
Albumin: 4.6 g/dL (ref 3.8–4.9)
Alkaline Phosphatase: 73 IU/L (ref 48–121)
BUN/Creatinine Ratio: 10 (ref 9–20)
BUN: 10 mg/dL (ref 6–24)
Bilirubin Total: 0.4 mg/dL (ref 0.0–1.2)
CO2: 24 mmol/L (ref 20–29)
Calcium: 9.6 mg/dL (ref 8.7–10.2)
Chloride: 104 mmol/L (ref 96–106)
Creatinine, Ser: 0.96 mg/dL (ref 0.76–1.27)
GFR calc Af Amer: 101 mL/min/{1.73_m2} (ref >59)
GFR calc non Af Amer: 87 mL/min/{1.73_m2} (ref >59)
Globulin, Total: 3.5 g/dL (ref 1.5–4.5)
Glucose: 89 mg/dL (ref 65–99)
Potassium: 4.4 mmol/L (ref 3.5–5.2)
Sodium: 139 mmol/L (ref 134–144)
Total Protein: 8.1 g/dL (ref 6.0–8.5)

## 2019-09-11 LAB — CBC
Hematocrit: 43.4 % (ref 37.5–51.0)
Hemoglobin: 15 g/dL (ref 13.0–17.7)
MCH: 32.5 pg (ref 26.6–33.0)
MCHC: 34.6 g/dL (ref 31.5–35.7)
MCV: 94 fL (ref 79–97)
Platelets: 223 10*3/uL (ref 150–450)
RBC: 4.62 x10E6/uL (ref 4.14–5.80)
RDW: 13.9 % (ref 11.6–15.4)
WBC: 6.7 10*3/uL (ref 3.4–10.8)

## 2019-09-11 LAB — LIPID PANEL
Chol/HDL Ratio: 2.7 ratio (ref 0.0–5.0)
Cholesterol, Total: 250 mg/dL — ABNORMAL HIGH (ref 100–199)
HDL: 92 mg/dL (ref >39)
LDL Chol Calc (NIH): 133 mg/dL — ABNORMAL HIGH (ref 0–99)
Triglycerides: 146 mg/dL (ref 0–149)
VLDL Cholesterol Cal: 25 mg/dL (ref 5–40)

## 2019-09-13 NOTE — Progress Notes (Signed)
Let patient know that his blood count is normal meaning no anemia.  Kidney and liver function tests are normal.  His LDL cholesterol is elevated at one hundred and thirty-three with normal being less than seventy.  He should take the medication atorvastatin which I prescribed for him on recent visit.  This will help to lower cholesterol and in doing so, help reduce the risks of having another heart attack or stroke.

## 2019-09-14 ENCOUNTER — Telehealth: Payer: Self-pay

## 2019-09-14 NOTE — Telephone Encounter (Signed)
Contacted pt to go over lab results pt is aware and doesn't have any questions or concerns 

## 2019-09-15 NOTE — Progress Notes (Addendum)
Cardiology Office Note:    Date:  09/16/2019   ID:  [name], DOB 1960/04/18, MRN [id number]  PCP:  Kevin Blackbird, MD  Cardiologist:  Kevin Mocha, MD   Electrophysiologist:  None   Referring MD: Kevin Blackbird, MD   Chief Complaint:  Surgical Clearance    Patient Profile:    Kevin Barton is a 59 y.o. male with:   Coronary artery disease   Known CTO of the RCA w/ L-R collats >> med Rx  Hx of CVA (R MCA) in 8/19 >> tx w/ tPA, endovascular intervention/ICA stent  Polysubstance abuse  Hypertension   Hyperlipidemia   Tobacco abuse   Prior CV studies: Carotid US 05/08/2018 RICA stent patent; L 1-39  Carotid US 10/07/17 Final Interpretation: Right Carotid: Right ICA stent <50% stenosis. Left Carotid: Velocities in the left ICA are consistent with a 1-39% stenosis. Vertebrals: Bilateral vertebral arteries demonstrate antegrade flow.  Echocardiogram 10/06/2017 Moderate LVH, EF 65-70, normal wall motion, grade 2 diastolic dysfunction, mild MAC, trivial MR, normal RV SF  Cardiac catheterization 07/25/2016 LAD mild disease RI mild disease LCx ostial 30 RCA mid 100 with L-R collaterals (first septal perforator) EF 45-50 1. Severe single vessel CAD with total occlusion of the RCA, collateralized from the left coronary artery 2. Mild nonobstructive plaque in the left main, LAD, and LCx 3. Mild segmental LV dysfunction, LVEF 50% Recommend: medical therapy  History of Present Illness:    Kevin Barton was last seen in clinic in 02/2018.  He returns for surgical clearance.  He needs a R TKR with Kevin Barton.   He is here alone today.  He recently just had his medications renewed.  He has not been on ticagrelor for probably 2 years.  He has not seen interventional radiology since February 2020.  He has not had chest pain, significant shortness of breath, orthopnea, significant leg swelling or syncope.  He is fairly limited by his right knee arthritis.  He is trying to quit smoking.   He denies any recurrent cocaine use or heavy alcohol use.     Past Medical History:  Diagnosis Date  . Arthritis   . CAD (coronary artery disease)    cath 06/2016 occlusion of RCA with collateralization from the left coronary artery. Mild nonobstructive plaque in the left main, LAD, and LCx. LVEF 50%. TX Rx.   . Chronic back pain   . Chronic knee pain   . HTN (hypertension)   . Hyperlipidemia   . NSTEMI (non-ST elevated myocardial infarction) ([place])   . Stroke ([place])   . Tobacco abuse     Current Medications: Current Meds  Medication Sig  . amLODipine (NORVASC) 5 MG tablet Take 1 tablet (5 mg total) by mouth daily.  Marland Kitchen aspirin EC 81 MG tablet Take 1 tablet (81 mg total) by mouth daily.  Marland Kitchen atorvastatin (LIPITOR) 80 MG tablet Take 1 tablet (80 mg total) by mouth daily at 6 PM.  . ticagrelor (BRILINTA) 90 MG TABS tablet Take 1 tablet (90 mg total) by mouth 2 (two) times daily.  . varenicline (CHANTIX STARTING MONTH PAK) 0.5 MG X 11 & 1 MG X 42 tablet Take one 0.5 mg tablet by mouth once daily for 3 days, then increase to one 0.5 mg tablet twice daily for 4 days, then increase to one 1 mg tablet twice daily.     Allergies:   Patient has no known allergies.   Social History   Tobacco Use  . Smoking status: Current  Every Day Smoker    Packs/day: 0.50    Types: Cigarettes  . Smokeless tobacco: Never Used  . Tobacco comment: smoke 6 per week  Vaping Use  . Vaping Use: Never used  Substance Use Topics  . Alcohol use: Yes    Comment: a beer a day  . Drug use: No     Family Hx: The patient's family history includes Diabetes in his brother.  ROS   EKGs/Labs/Other Test Reviewed:    EKG:  EKG is   ordered today.  The ekg ordered today demonstrates normal sinus rhythm, heart rate 73, normal axis, nonspecific ST-T wave changes, QTC 409, no significant change since prior tracing  Recent Labs: 09/10/2019: ALT 18; BUN 10; Creatinine, Ser 0.96; Hemoglobin 15.0; Platelets 223; Potassium  4.4; Sodium 139   Recent Lipid Panel Lab Results  Component Value Date/Time   CHOL 250 (H) 09/10/2019 04:03 PM   TRIG 146 09/10/2019 04:03 PM   HDL 92 09/10/2019 04:03 PM   CHOLHDL 2.7 09/10/2019 04:03 PM   CHOLHDL 3.3 10/06/2017 05:00 AM   LDLCALC 133 (H) 09/10/2019 04:03 PM    Physical Exam:    VS:  BP 134/80   Pulse 73   Ht _0  (1.626 m)   Wt 174 lb (78.9 kg)   SpO2 97%   BMI 29.87 kg/m     Wt Readings from Last 3 Encounters:  09/16/19 174 lb (78.9 kg)  09/10/19 174 lb 3.2 oz (79 kg)  08/14/19 168 lb 12.8 oz (76.6 kg)     Constitutional:      Appearance: Healthy appearance. Not in distress.  Neck:     Thyroid: No thyromegaly.     Vascular: JVD normal.  Pulmonary:     Effort: Pulmonary effort is normal.     Breath sounds: No wheezing. No rales.  Cardiovascular:     Normal rate. Regular rhythm. Normal S1. Normal S2.     Murmurs: There is no murmur.  Edema:    Peripheral edema absent.  Abdominal:     Palpations: Abdomen is soft. There is no hepatomegaly.  Skin:    General: Skin is warm and dry.  Neurological:     General: No focal deficit present.     Mental Status: Alert and oriented to person, place and time.     Cranial Nerves: Cranial nerves are intact.       ASSESSMENT & PLAN:    1. Preoperative cardiovascular examination   Kevin Barton perioperative risk of a major cardiac event is 6.6% according to the Revised Cardiac Risk Index (RCRI).  Therefore, he is at high risk for perioperative complications.   His functional capacity is fair at 4.31 METs according to the Duke Activity Status Index (DASI). Recommendations: According to ACC/AHA guidelines, no further cardiovascular testing needed.  The patient may proceed to surgery at acceptable risk.   Antiplatelet and/or Anticoagulation Recommendations: The patient should remain on Aspirin without interruption.   I did review his case with one of the PAs with Kevin Barton with IR.  I have also reviewed  notes from IR in his chart dated 04/10/2018.  Per IR, he can hold Ticagrelor for his surgery.  Of note, he has not been on it for the last year or more.  Kevin Barton office will contact him to and let him know if he needs to restart this.  If he needs to restart Ticagrelor, it can be held for 5-7 days prior to his surgery and resumed  as soon as possible post op.  2. Coronary artery disease involving native coronary artery of native heart without angina pectoris Cardiac catheterization in May 2018 demonstrated a totally occluded RCA with left-to-right collaterals and mild nonobstructive disease elsewhere.  He is doing well without anginal symptoms.  Continue ASA, statin.   3. History of stroke Right MCA stroke in August 2019 which was treated with endovascular intervention of the right MCA as well as stenting of an occluded right ICA.  As noted, I have discussed his case today with interventional radiology.  He will be contacted by interventional radiology if he needs to resume Ticagrelor.  4. Essential hypertension Fair control.  Continue current dose of amlodipine.  5. Mixed hyperlipidemia Atorvastatin was recently started.  Continue high intensity statin therapy.  6. Tobacco abuse He is trying to quit.  7. Homelessness He is living in a hotel and has limited income.  I will reach out to our social work team to see if there is anything we can help with.    Dispo:  Return in about 6 months (around 03/18/2020) for Routine Follow Up, w/ Dr. Burt Knack, or Richardson Dopp, PA-C, in person.   Medication Adjustments/Labs and Tests Ordered: Current medicines are reviewed at length with the patient today.  Concerns regarding medicines are outlined above.  Tests Ordered: Orders Placed This Encounter  Procedures  . EKG 12-Lead   Medication Changes: No orders of the defined types were placed in this encounter.   Signed, Richardson Dopp, PA-C  09/16/2019 10:17 AM    [facility]  [facility], [place], [place]  [address] Phone: [phone]; Fax: [phone]

## 2019-09-16 ENCOUNTER — Telehealth: Payer: Self-pay | Admitting: Licensed Clinical Social Worker

## 2019-09-16 ENCOUNTER — Encounter: Payer: Self-pay | Admitting: Physician Assistant

## 2019-09-16 ENCOUNTER — Ambulatory Visit (INDEPENDENT_AMBULATORY_CARE_PROVIDER_SITE_OTHER): Payer: Medicaid Other | Admitting: Physician Assistant

## 2019-09-16 ENCOUNTER — Other Ambulatory Visit: Payer: Self-pay

## 2019-09-16 VITALS — BP 134/80 | HR 73 | Ht 64.0 in | Wt 174.0 lb

## 2019-09-16 DIAGNOSIS — E782 Mixed hyperlipidemia: Secondary | ICD-10-CM | POA: Diagnosis not present

## 2019-09-16 DIAGNOSIS — I10 Essential (primary) hypertension: Secondary | ICD-10-CM

## 2019-09-16 DIAGNOSIS — Z01810 Encounter for preprocedural cardiovascular examination: Secondary | ICD-10-CM

## 2019-09-16 DIAGNOSIS — Z8673 Personal history of transient ischemic attack (TIA), and cerebral infarction without residual deficits: Secondary | ICD-10-CM | POA: Diagnosis not present

## 2019-09-16 DIAGNOSIS — I2510 Atherosclerotic heart disease of native coronary artery without angina pectoris: Secondary | ICD-10-CM | POA: Diagnosis not present

## 2019-09-16 DIAGNOSIS — Z5900 Homelessness unspecified: Secondary | ICD-10-CM

## 2019-09-16 DIAGNOSIS — Z720 Tobacco use: Secondary | ICD-10-CM | POA: Diagnosis not present

## 2019-09-16 NOTE — Progress Notes (Signed)
Heart and Vascular Care Navigation  09/16/2019  Kevin Barton April 10, 1960 [id number]  Reason for Referral: Housing concerns- pt currently living in hotel.                                                                                                    Assessment:  CSW called pt to check in regarding pt current housing situation and assess for other concerns.  Pt reports he has been living at a hotel for 6 years- relies on his brother to help him with hotel costs as he can't afford it with his SSI check ($794/month).  Pt is very interested in getting help with applying for low income housing options- thinks he can pay $300-400/month for rent.                                     HRT/VAS Care Coordination    Patients Home Cardiology Office [facility] [address]   Outpatient Care Team Social Worker   Social Worker Name: Rosetta Posner- Heart and Vascular- [phone]   Living arrangements for the past 2 months Hotel/Motel   Lives with: Self   Patient Current Insurance Coverage Medicaid   Patient Has Concern With Paying Medical Bills No   Does Patient Have Prescription Coverage? Yes   Home Assistive Devices/Equipment None   DME Agency [facility]      Social History:                                                                             SDOH Screenings   Alcohol Screen:   . Last Alcohol Screening Score (AUDIT):   Depression (PHQ2-9): Low Risk   . PHQ-2 Score: 0  Financial Resource Strain: High Risk  . Difficulty of Paying Living Expenses: Hard  Food Insecurity: Food Insecurity Present  . Worried About Programme researcher, broadcasting/film/video in the Last Year: Sometimes true  . Ran Out of Food in the Last Year: Never true  Housing: Low Risk   . Last Housing Risk Score: 0  Physical Activity:   . Days of Exercise per Week:   . Minutes of Exercise per Session:   Social Connections:   . Frequency of Communication with Friends and Family:   . Frequency of Social Gatherings with Friends  and Family:   . Attends Religious Services:   . Active Member of Clubs or Organizations:   . Attends Banker Meetings:   Marland Kitchen Marital Status:   Stress:   . Feeling of Stress :   Tobacco Use: High Risk  . Smoking Tobacco Use: Current Every Day Smoker  . Smokeless Tobacco Use: Never Used  Transportation Needs: No Transportation Needs  . Lack of Transportation (Medical):  No  . Lack of Transportation (Non-Medical): No    SDOH Interventions: Financial Resources:   Pt only gets SSI ($794/month) which does not take him very far   Food Insecurity:  Food Insecurity Interventions: Other (Comment) (already receives food stamps- [phone])  Housing Insecurity:  Housing Interventions: Other (Comment) (pt living in motel long term- is stable there but wants to move into low income housing- CSW sending low income housing options)  Transportation:   Transportation Interventions:  (Pt uses public transit and states this works for him- is aware of his ability to use Medicaid transport but not interested in utilizing)    Follow-up plan:    CSW to mail pt multiple housing applications and follow up with pt tomorrow to apply for [employer] based housing for people over 55yo.  Burna Sis, LCSW Clinical Social Worker [facility] Desk#: [phone] Cell#: [phone]

## 2019-09-16 NOTE — Patient Instructions (Signed)
Medication Instructions:  Your physician recommends that you continue on your current medications as directed. Please refer to the Current Medication list given to you today.  *If you need a refill on your cardiac medications before your next appointment, please call your pharmacy*  Lab Work: None ordered today  Testing/Procedures: None ordered today  Follow-Up: At [facility], you and your health needs are our priority.  As part of our continuing mission to provide you with exceptional heart care, we have created designated Provider Care Teams.  These Care Teams include your primary Cardiologist (physician) and Advanced Practice Providers (APPs -  Physician Assistants and Nurse Practitioners) who all work together to provide you with the care you need, when you need it.  We recommend signing up for the patient portal called "MyChart".  Sign up information is provided on this After Visit Summary.  MyChart is used to connect with patients for Virtual Visits (Telemedicine).  Patients are able to view lab/test results, encounter notes, upcoming appointments, etc.  Non-urgent messages can be sent to your provider as well.   To learn more about what you can do with MyChart, go to [email or web address].    Your next appointment:   6 month(s)  The format for your next appointment:   In Person  Provider:   You may see Tonny Bollman, MD or Tereso Newcomer, PA-C  Other Instructions Dr. Fatima Sanger office will contact you about your Bril[place]

## 2019-09-21 ENCOUNTER — Telehealth (HOSPITAL_COMMUNITY): Payer: Self-pay | Admitting: Licensed Clinical Social Worker

## 2019-09-21 NOTE — Telephone Encounter (Signed)
CSW mailed pt several housing applications for low income housing- pt will fill out and return to CSW so we can submit.  Will continue to follow and assist as needed  Burna Sis, LCSW Clinical Social Worker [facility] Desk#: [phone] Cell#: [phone]

## 2019-09-22 ENCOUNTER — Telehealth (HOSPITAL_COMMUNITY): Payer: Self-pay | Admitting: Licensed Clinical Social Worker

## 2019-09-22 NOTE — Telephone Encounter (Signed)
CSW able to call [employer] authority and submit application for [employer] over the phone.  Informed pt and provided pt family with pt new email address where they will send updates.  Burna Sis, LCSW Clinical Social Worker [facility] Desk#: [phone] Cell#: [phone]

## 2019-10-12 ENCOUNTER — Other Ambulatory Visit: Payer: Self-pay | Admitting: Internal Medicine

## 2019-10-12 DIAGNOSIS — I2510 Atherosclerotic heart disease of native coronary artery without angina pectoris: Secondary | ICD-10-CM

## 2019-10-12 DIAGNOSIS — F17200 Nicotine dependence, unspecified, uncomplicated: Secondary | ICD-10-CM

## 2019-10-12 MED ORDER — ATORVASTATIN CALCIUM 80 MG PO TABS: 80.0000 mg | ORAL_TABLET | Freq: Every day | ORAL | 4 refills | Status: DC

## 2019-10-12 MED ORDER — CHANTIX STARTING MONTH PAK 0.5 MG X 11 & 1 MG X 42 PO TABS: ORAL_TABLET | ORAL | 0 refills | Status: DC

## 2019-10-12 NOTE — Telephone Encounter (Signed)
Medication Refill - Medication: atorvastatin (LIPITOR) 80 MG tablet varenicline (CHANTIX STARTING MONTH PAK) 0.5 MG X 11 & 1 MG X 42 tablet    Has the patient contacted their pharmacy? Yes.   (Agent: If no, request that the patient contact the pharmacy for the refill.) (Agent: If yes, when and what did the pharmacy advise?)  Preferred Pharmacy (with phone number or street name):  [facility] [address] ([address]), [place] - [address]  [address] [place] ([place] [address]  Phone: [phone] Fax: [phone]     Agent: Please be advised that RX refills may take up to 3 business days. We ask that you follow-up with your pharmacy.

## 2019-10-13 ENCOUNTER — Telehealth (HOSPITAL_COMMUNITY): Payer: Self-pay | Admitting: Licensed Clinical Social Worker

## 2019-10-13 NOTE — Telephone Encounter (Signed)
Pt called CSW to touch base about housing applications.  Pt aware that he needs to get paperwork together to complete applications and that wife and her adult son who he plans on living with him will also need to complete paperwork- does not wish me to send that additional paperwork at this time.  Pt is planning to work on getting birth certificate and license which will be required in order move forward with low income housing applications.  CSW offered to help pt order license online.  Order placed and will hopefully arrive in 7-10 business days.  CSW will continue to follow and assist as needed  Burna Sis, LCSW Clinical Social Worker [facility] Desk#: [phone] Cell#: [phone]

## 2019-10-15 ENCOUNTER — Telehealth (HOSPITAL_COMMUNITY): Payer: Self-pay | Admitting: Licensed Clinical Social Worker

## 2019-10-15 NOTE — Telephone Encounter (Signed)
CSW received call from pt who is needing a non filing tax form in order to move forward getting his COVID stimulus check.  CSW able to fill out the form and mail it to pt to sign and return to [employer]  Will continue to follow and assist as needed  Burna Sis, LCSW Clinical Social Worker [facility] Desk#: [phone] Cell#: [phone]

## 2019-10-22 DIAGNOSIS — H5213 Myopia, bilateral: Secondary | ICD-10-CM | POA: Diagnosis not present

## 2019-10-30 ENCOUNTER — Other Ambulatory Visit (HOSPITAL_COMMUNITY): Payer: Self-pay | Admitting: Interventional Radiology

## 2019-10-30 DIAGNOSIS — I639 Cerebral infarction, unspecified: Secondary | ICD-10-CM

## 2019-11-24 ENCOUNTER — Ambulatory Visit (HOSPITAL_COMMUNITY): Admission: RE | Admit: 2019-11-24 | Payer: Medicaid Other | Source: Ambulatory Visit

## 2019-11-24 ENCOUNTER — Encounter (HOSPITAL_COMMUNITY): Payer: Self-pay

## 2019-12-11 ENCOUNTER — Ambulatory Visit: Payer: Medicaid Other | Attending: Internal Medicine | Admitting: Internal Medicine

## 2019-12-11 ENCOUNTER — Other Ambulatory Visit: Payer: Self-pay

## 2019-12-11 DIAGNOSIS — I2510 Atherosclerotic heart disease of native coronary artery without angina pectoris: Secondary | ICD-10-CM

## 2019-12-11 DIAGNOSIS — I10 Essential (primary) hypertension: Secondary | ICD-10-CM | POA: Diagnosis not present

## 2019-12-11 DIAGNOSIS — F17200 Nicotine dependence, unspecified, uncomplicated: Secondary | ICD-10-CM

## 2019-12-11 DIAGNOSIS — M1711 Unilateral primary osteoarthritis, right knee: Secondary | ICD-10-CM | POA: Diagnosis not present
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
## 2020-04-11 IMAGING — CT CT ANGIO NECK
2 of 7 series · 8 of 33 positions shown · IV contrast (iopamidol)
Comparison: CT head without contrast from the same day.

CLINICAL DATA: Code stroke. Left-sided weakness. Right-sided gaze.
Slurred speech.

EXAM:
CT ANGIOGRAPHY HEAD AND NECK
TECHNIQUE: Multidetector CT imaging of the head and neck was performed using
the standard protocol during bolus administration of intravenous
contrast. Multiplanar CT image reconstructions and MIPs were
obtained to evaluate the vascular anatomy. Carotid stenosis
measurements (when applicable) are obtained utilizing NASCET
criteria, using the distal internal carotid diameter as the
denominator.
CONTRAST:  50mL 7YGZWE-KLN IOPAMIDOL ([id number]-KLN) INJECTION 76%

[Series 5: cta neck/head · axial · 0.48mm/px · z∈[+424,+528]mm · 2 of 156 slices shown]
[im 52/156  soft-tissue]
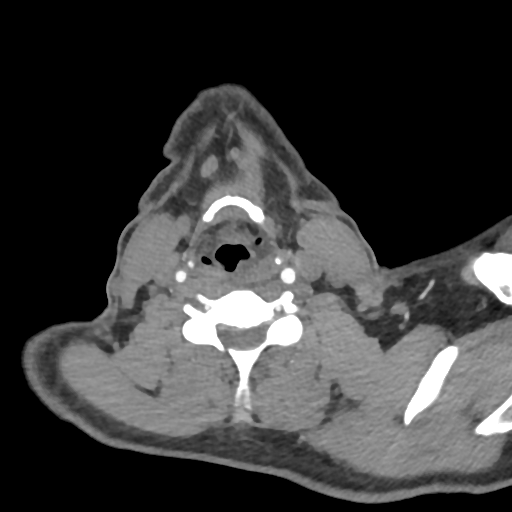
[im 104/156  soft-tissue]
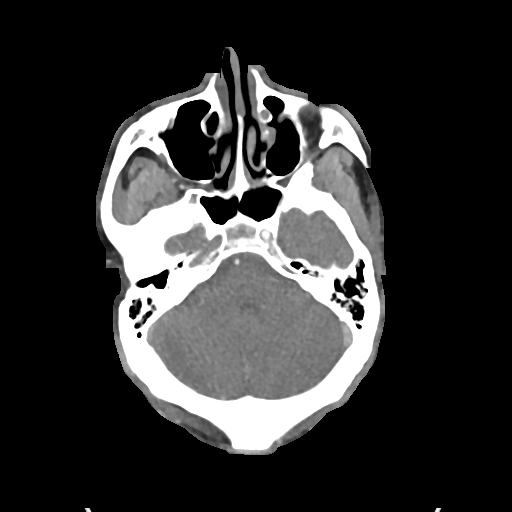

[Series 7: ax thins · axial · 0.39mm/px · z∈[+368,+589]mm · 6 of 311 slices shown]
[im 45/311  soft-tissue]
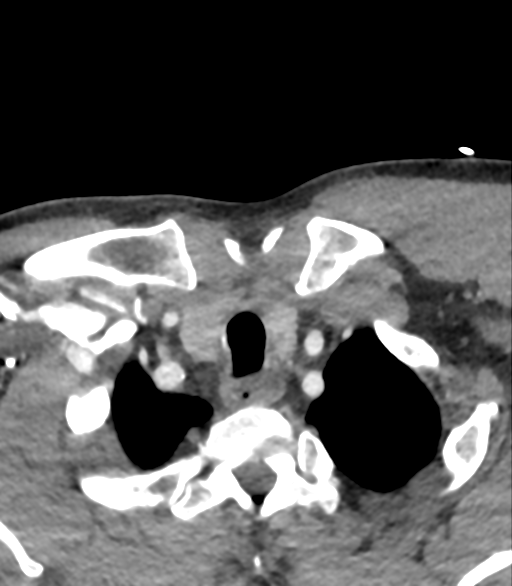
[im 89/311  bone]
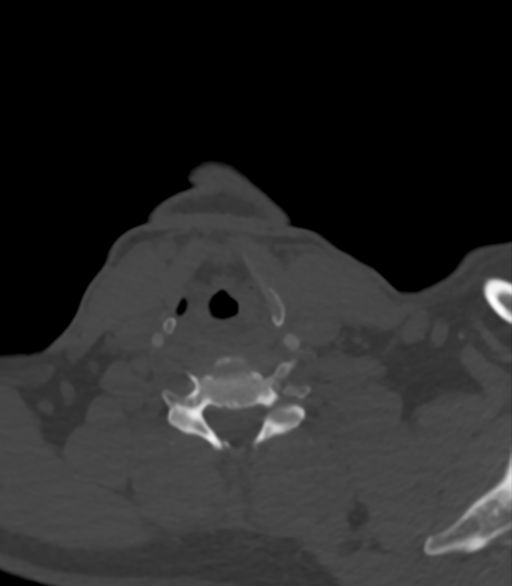
[im 133/311  soft-tissue]
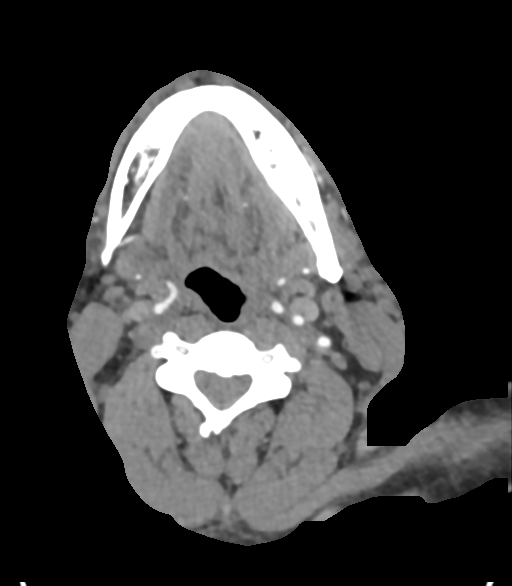
[im 178/311  bone]
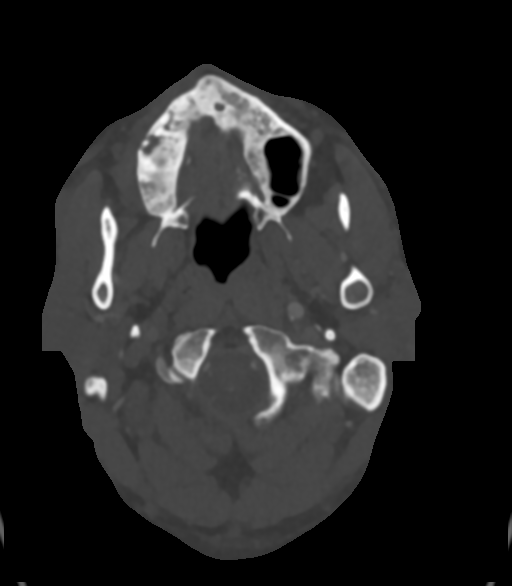
[im 222/311  soft-tissue]
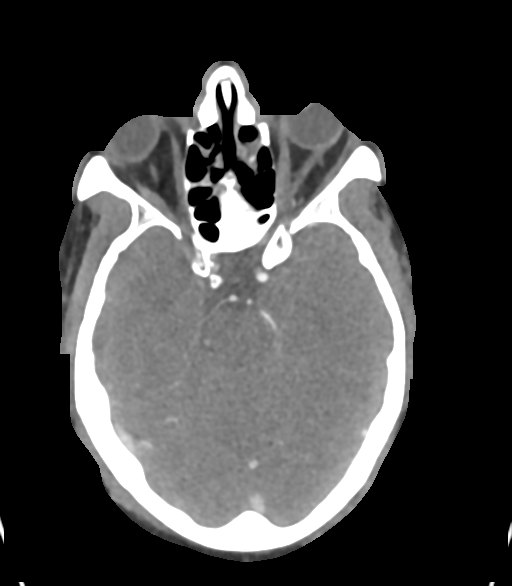
[im 266/311  bone]
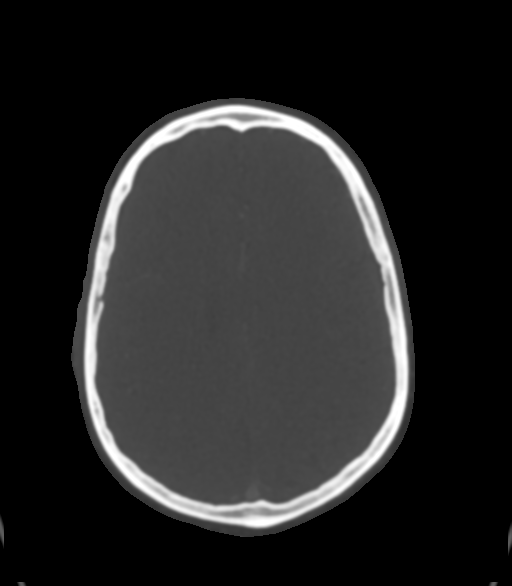

[8 of 33 positions shown; findings below may reference images not displayed]

FINDINGS: CTA NECK FINDINGS

Aortic arch: A 3 vessel arch configuration is present. No
significant stenosis or aneurysm is present.

Right carotid system: The right common carotid artery is within
normal limits. Atherosclerotic changes are present at the
bifurcation. The right internal carotid artery is occluded just
beyond the bifurcation without significant reconstitution in the
neck.

Left carotid system: The left common carotid artery is within normal
limits. Atherosclerotic calcifications are present at the
bifurcation without a significant stenosis relative to the more
distal vessel. Minimal calcification is present just below the skull
base.

Vertebral arteries: The left vertebral artery is dominant. Both
vertebral arteries originate from the subclavian arteries without
significant stenosis.

Skeleton: Vertebral body heights alignment are maintained. No focal
lytic or blastic lesions are present. Large dental caries is present
in the first left maxillary molar with marked periapical lucency.
Large dental caries is present in the second right maxillary
premolar. Maxillary teeth are absent.

Other neck: Soft tissues the neck are otherwise unremarkable. No
focal mucosal or submucosal lesion is present. Salivary glands are
normal. Thyroid is normal. Trachea is unremarkable.

Upper chest: Diffuse ground-glass attenuation is consistent with
atelectasis or edema. No significant airspace consolidation is
present. The thoracic inlet is within normal limits.

Review of the MIP images confirms the above findings

CTA HEAD FINDINGS

Anterior circulation: Right internal carotid artery is occluded
without reconstitution in the cavernous segment. There is slight
retrograde flow from a patent anterior communicating artery. Both A2
segments fill from the left. No significant flow is present in the
right M1 segment. There is some collateral flow into distal right
MCA branch vessels. Left A1 and M1 segments are normal. Left MCA
bifurcation is intact. There is moderate attenuation of MCA branch
vessels without a significant proximal stenosis or occlusion. ACA
branch vessels are within normal limits.

Posterior circulation: The left vertebral artery is the dominant
vessel. PICA origins are visualized and normal. Basilar artery is
small. A fetal type left posterior cerebral artery is present. The
right posterior cerebral artery originates from the basilar tip.
There is moderate attenuation of distal PCA branch vessels without a
significant proximal stenosis or occlusion.

Venous sinuses: Dural sinuses are patent. Straight sinus and deep
cerebral veins are intact. Cortical veins are unremarkable.

Anatomic variants: Fetal type left posterior cerebral artery.

Delayed phase: Not performed in the emergent setting.

Review of the MIP images confirms the above findings
IMPRESSION: 1. Right internal carotid artery occluded from the bifurcation
through the ICA terminus.
2. Retrograde flow into the right A1 segment via a patent anterior
communicating artery. There is slight opacification into the ICA
terminus.
3. Some right MCA branch vessels are reconstituted via pial
collaterals.
4. Moderate proximal narrowing in the left M2 branch vessels without
focal occlusion.
5. Mild distal PCA branch vessel irregularity without a significant
posterior circulation stenosis or occlusion.
6. Bilateral maxillary dental disease.
7. Edema or atelectasis throughout the lung apices.

## 2020-04-11 IMAGING — CT CT HEAD CODE STROKE
3 series · 15 of 47 positions shown, 18 images · non-contrast
Comparison: 11/23/2006

CLINICAL DATA: Code stroke.  Focal neuro deficit

EXAM:
CT HEAD WITHOUT CONTRAST
TECHNIQUE: Contiguous axial images were obtained from the base of the skull
through the vertex without intravenous contrast.

[Series 3: head 5.0 h30s · axial · 0.46mm/px · z∈[+863,+993]mm · 9 of 32 slices shown, 12 images]
[im 3/32  brain]
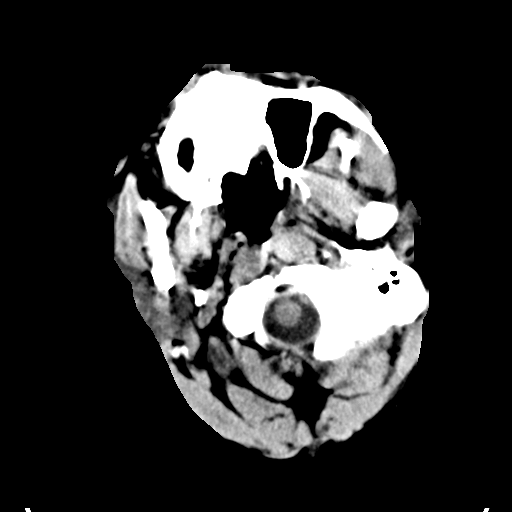
[im 3/32  bone]
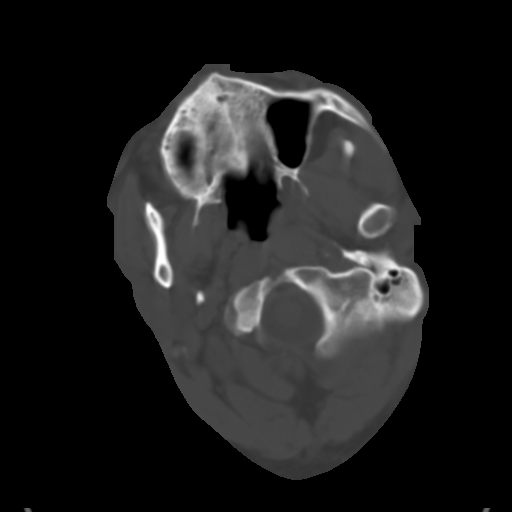
[im 6/32  brain]
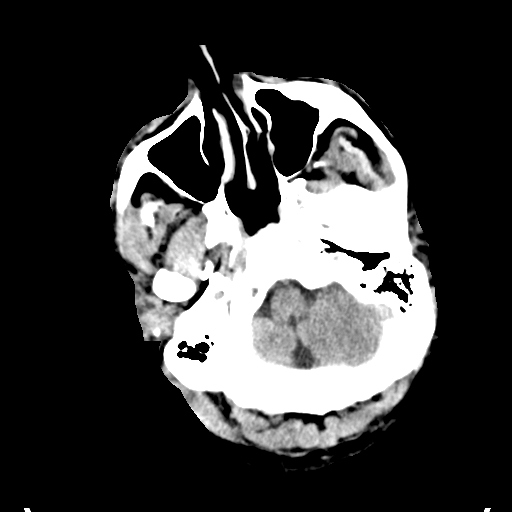
[im 9/32  brain]
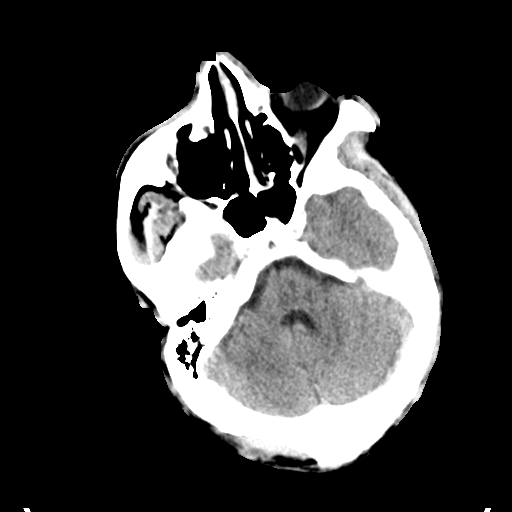
[im 12/32  brain]
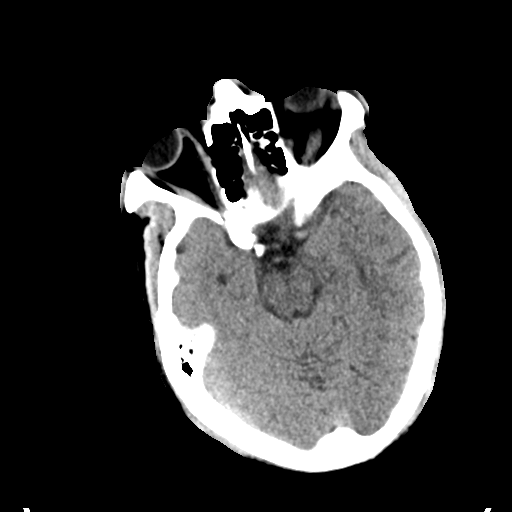
[im 17/32  brain]
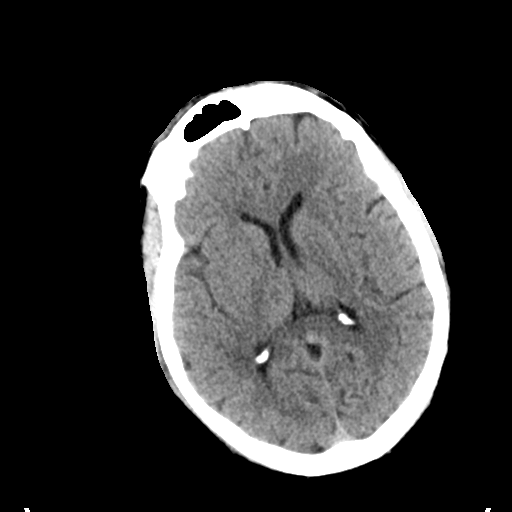
[im 17/32  bone]
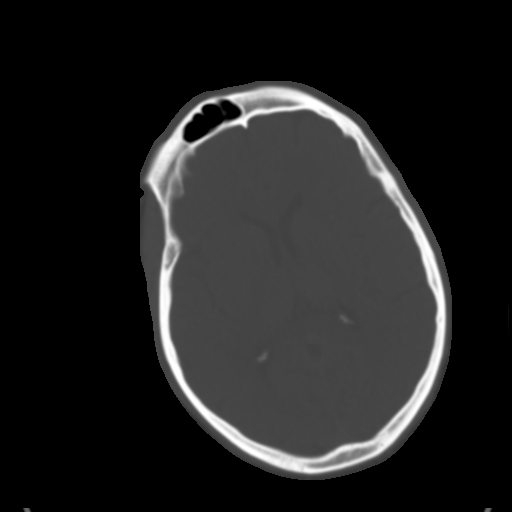
[im 20/32  brain]
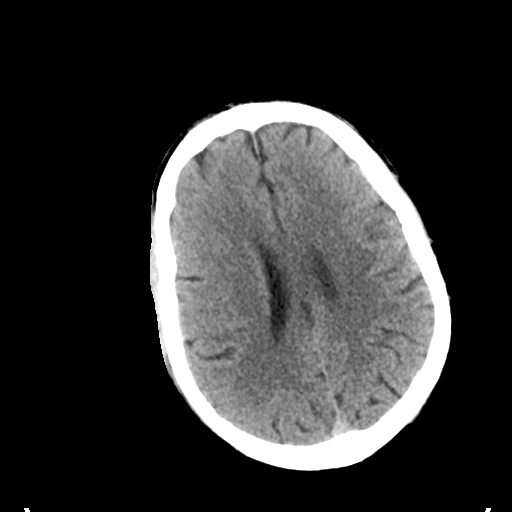
[im 23/32  brain]
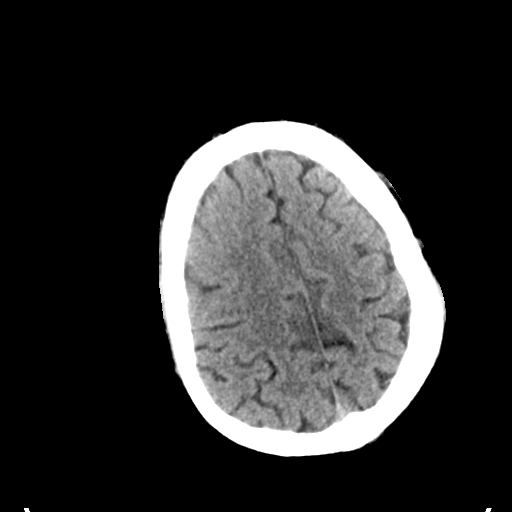
[im 26/32  brain]
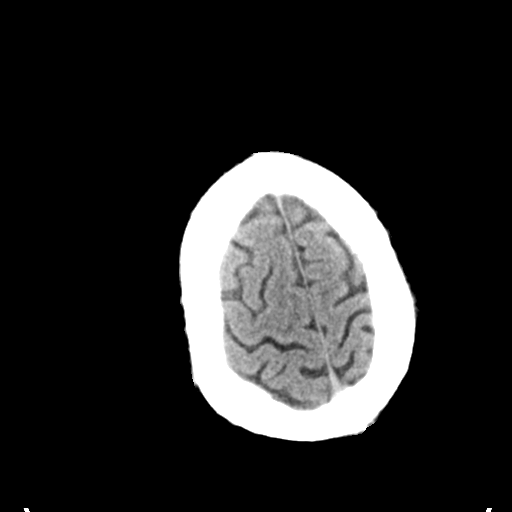
[im 29/32  brain]
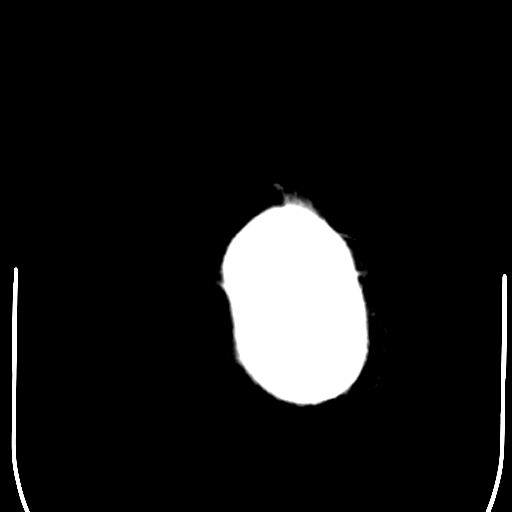
[im 29/32  bone]
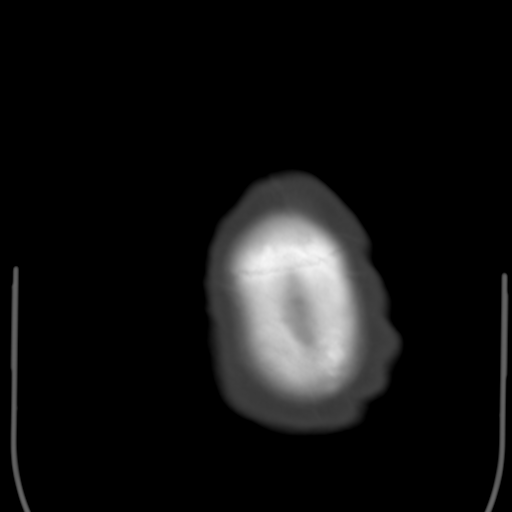

[Series 5: head 3.0 mpr cor · coronal · 0.31mm/px · 3 of 67 slices shown]
[im 23/67  brain]
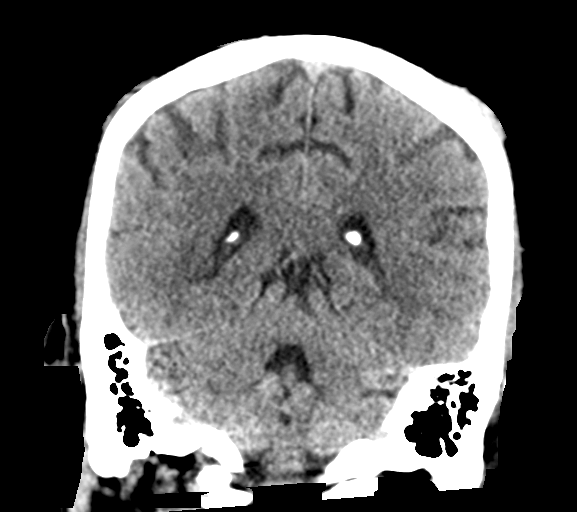
[im 30/67  brain]
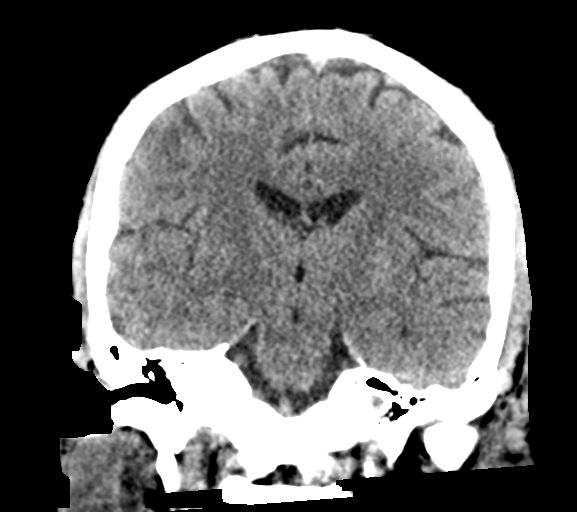
[im 37/67  brain]
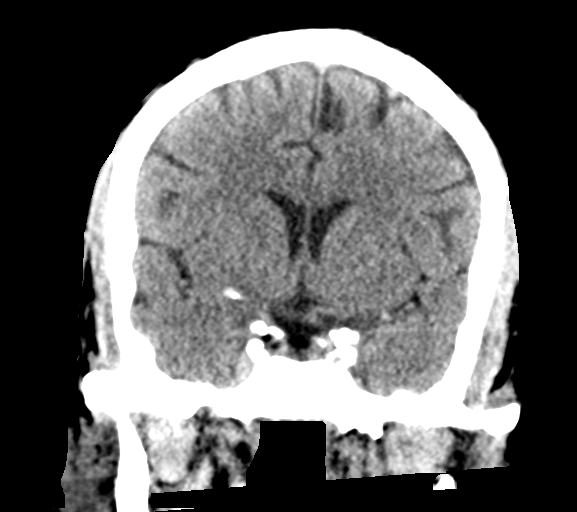

[Series 6: head 3.0 mpr sag · sagittal · 0.31mm/px · 3 of 67 slices shown]
[im 23/67  brain]
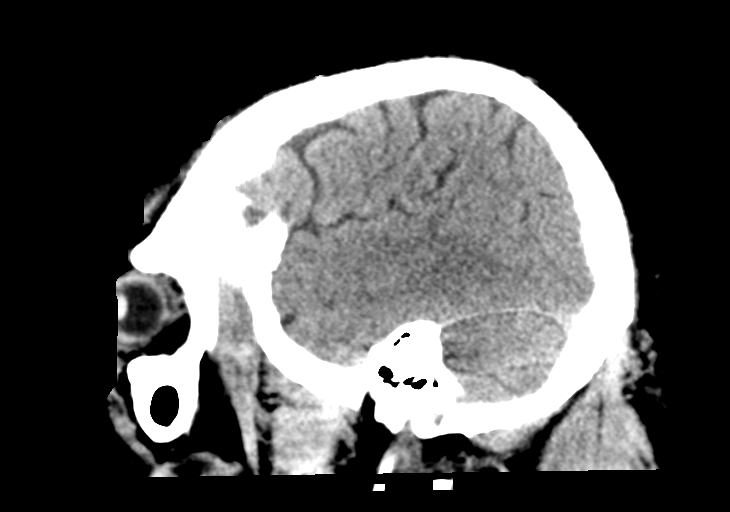
[im 34/67  brain]
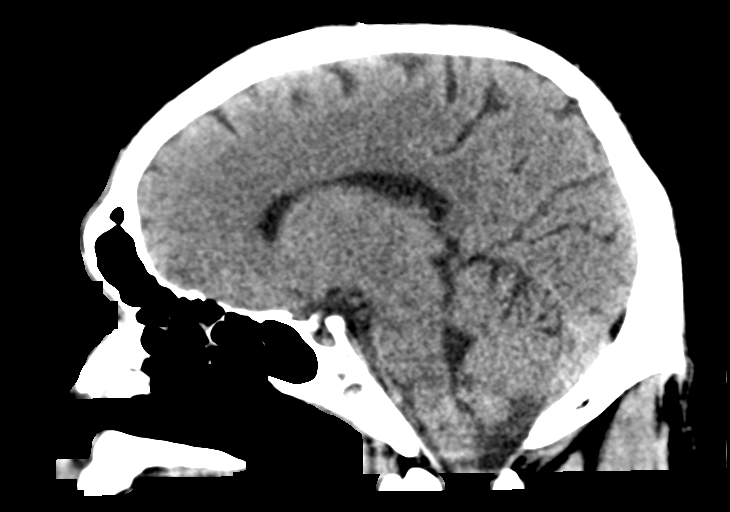
[im 45/67  brain]
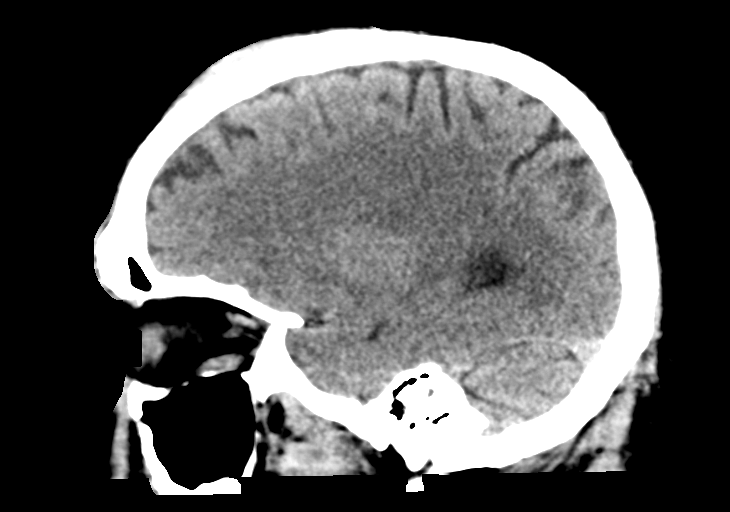

[15 of 47 positions shown; findings below may reference images not displayed]

FINDINGS: Brain: No evidence of acute infarction, hemorrhage, hydrocephalus,
extra-axial collection or mass lesion/mass effect.

Vascular: Hyperdense right M1 segment

Skull: No acute finding. Sequela of dental disease seen at the
maxilla on the right.

Sinuses/Orbits: Gaze to the right, correlating with the vascular
findings.

Other: These results were called by telephone at the time of
interpretation on 10/05/2017 at [DATE] to Dr. Lao , who was already
aware of the findings. CTA is pending as permitted by tPA
administration coordination

ASPECTS (Alberta Stroke Program Early CT Score)

- Ganglionic level infarction (caudate, lentiform nuclei, internal
capsule, insula, M1-M3 cortex): 7

- Supraganglionic infarction (M4-M6 cortex): 3

Total score (0-10 with 10 being normal): 10
IMPRESSION: Hyperdense right MCA consistent with thrombosis in this setting. No
acute hemorrhage. Aspects is 10.

## 2020-04-11 IMAGING — XA IR  CT HEAD LIMITED
1 series · 7 of 24 positions shown · IV contrast (IODINE)
Comparison: CT angiogram of the head and neck of 10/05/2017.

INDICATION: Acute onset of right gaze deviation, and left-sided hemiplegia.
Occluded right internal carotid artery, and right middle cerebral
artery M1 segment on CT angiogram of the head and neck.

EXAM:
1. EMERGENT LARGE VESSEL OCCLUSION THROMBOLYSIS (anterior
CIRCULATION)
TECHNIQUE: Following a full explanation of the procedure along with the
potential associated complications, an informed witnessed consent
was obtained. The risks of intracranial hemorrhage of 10%, worsening
neurological deficit, ventilator dependency, death and inability to
revascularize were all reviewed in detail with the patient's family.

[Series 300: dr. (person_name) · 7 of 340 slices shown]
[im 15/340]
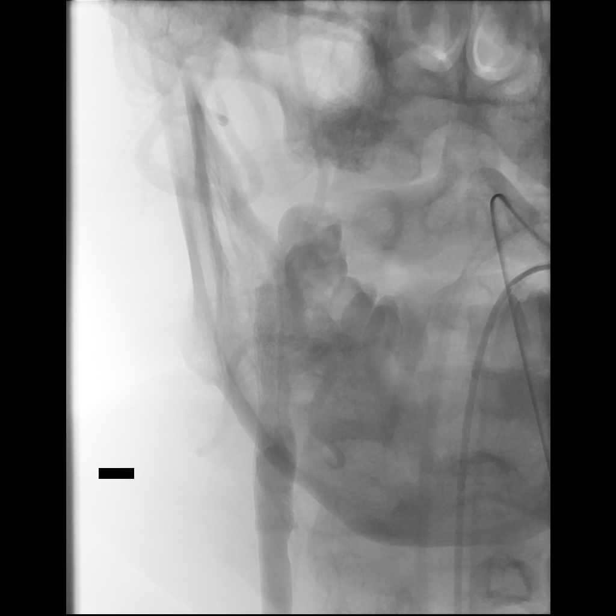
[im 74/340]
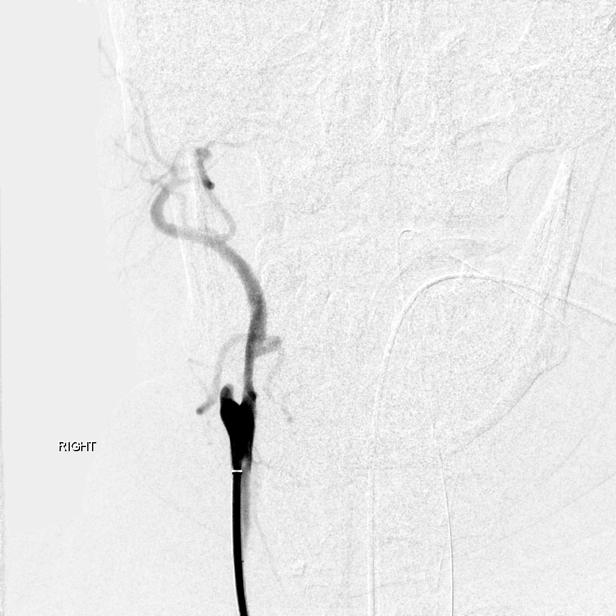
[im 118/340]
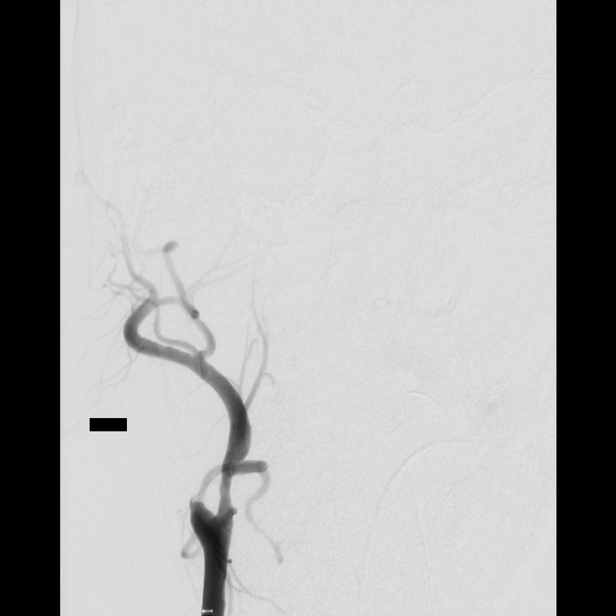
[im 177/340]
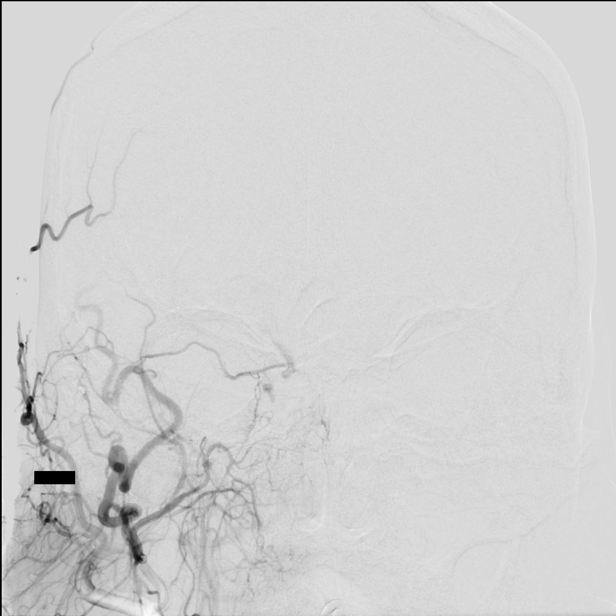
[im 222/340]
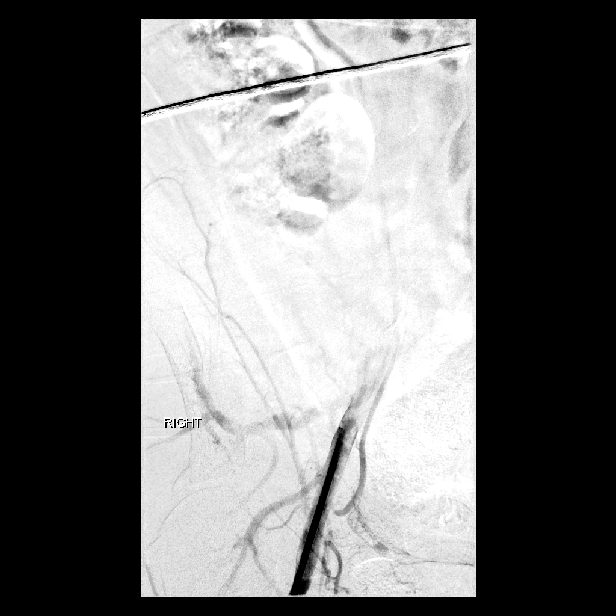
[im 266/340]
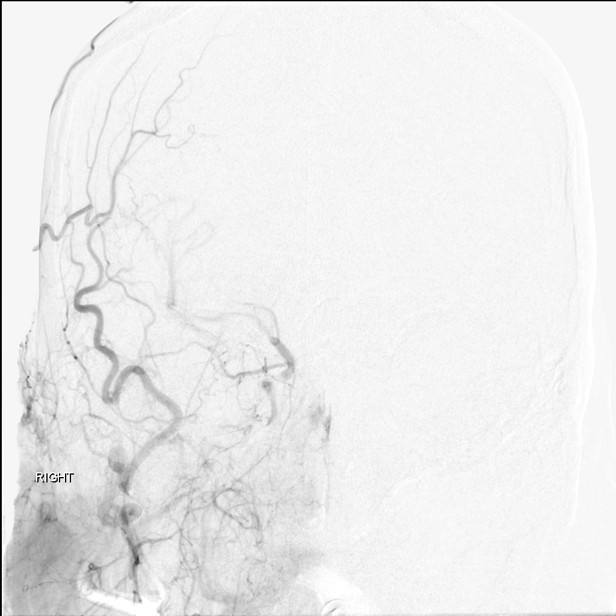
[im 325/340]
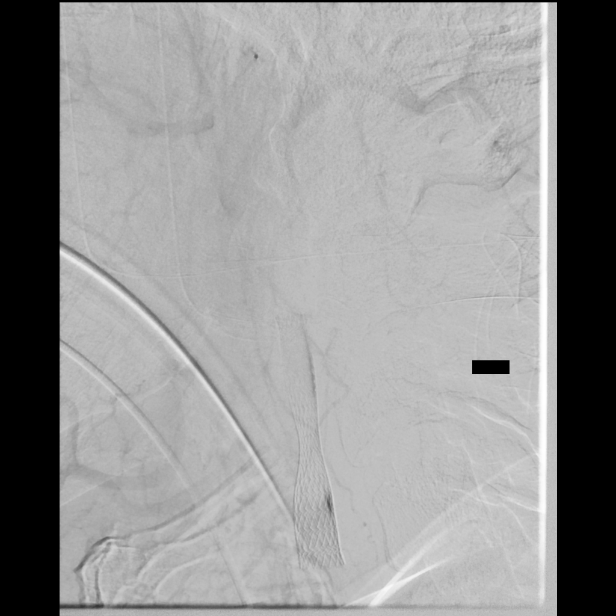

[7 of 24 positions shown; findings below may reference images not displayed]

MEDICATIONS:
Ancef 2 g IV antibiotic was administered within 1 hour of the
procedure.

ANESTHESIA/SEDATION:
General anesthesia.

CONTRAST:  Isovue 300 approximately 140 cc.

FLUOROSCOPY TIME:  Fluoroscopy Time: 46 minutes 18 seconds (6061
mGy).

COMPLICATIONS:
None immediate.
The patient was then put under general anesthesia by the [REDACTED] at [HOSPITAL].

The right groin was prepped and draped in the usual sterile fashion.
Thereafter using modified Seldinger technique, transfemoral access
into the right common femoral artery was obtained without
difficulty. Over a 0.035 inch guidewire a 5 French Pinnacle sheath
was inserted. Through this, and also over a 0.035 inch guidewire a 5
French JB 1 catheter was advanced to the aortic arch region and
selectively positioned in the innominate artery and the right common
carotid artery.
FINDINGS: The innominate artery arteriogram demonstrates the origin of the
right common carotid artery and the right subclavian artery to be
widely patent.

The right vertebral artery origin appears widely patent.

The vessel is seen to opacify to the cranial skull base to opacify
the right posterior-inferior cerebellar artery. The visualized right
vertebrobasilar junction distal to this is widely patent.

The right common carotid arteriogram demonstrates a 50% stenosis of
the right external carotid artery proximally. Its branches are
normally opacified.

The right internal carotid artery at the bulb has a flame shaped
bulb stump. There is a tiny linear area of opacification distal to
this.

However, delayed arterial phase demonstrate no angiographic
visualization of the right internal carotid artery to the cranial
skull base.

There is a mild partial reconstitution of the cavernous segment of
the right internal carotid artery via the ophthalmic artery from the
external carotid artery collaterals.

Distal to this the supraclinoid segment demonstrates an area of
filling defect with a larger filling defect in the right M1 segment
proximal to the bifurcation branches.

There is poor filling of the right middle cerebral artery
distribution.

No angiographic opacification of the anterior cerebral artery is
noted either.

PROCEDURE:
ENDOVASCULAR COMPLETE REVASCULARIZATION OF THE RIGHT MIDDLE CEREBRAL
ARTERY, WITH 1 PASS WITH THE 5 MM X 33 MM EMBOTRAP RETRIEVAL DEVICE
ACHIEVING A TICI 3 REPERFUSION, WITH STENT ASSISTED
REVASCULARIZATION OF THE ACUTELY SYMPTOMATIC OCCLUDED RIGHT INTERNAL
CAROTID ARTERY PROXIMALLY.

The diagnostic JB 1 catheter in the right common carotid artery was
exchanged over a 0.035 inch 300 cm Rosen exchange guidewire for a 55
cm 8 French Brite tip neurovascular sheath using biplane roadmap
technique and constant fluoroscopic guidance. Good aspiration
obtained from the hub of the 8 French Brite tip neurovascular
sheath. This was then connected to continuous heparinized saline
infusion. Over the Rosen exchange guidewire, an 8 French 85 cm
FlowGate balloon guide catheter which had been prepped with 50%
contrast and 50% heparinized saline infusion was advanced and
positioned just proximal to the right common carotid bifurcation.
The guidewire was removed. Good aspiration obtained from the hub of
the FlowGate guide catheter. A gentle control arteriogram
demonstrated no change in the intracranial or extracranial
circulations.

At this time, in a coaxial manner and with constant heparinized
saline infusion, using biplane roadmap technique and constant
fluoroscopic guidance, a combination of a 115 cm 5 French Navien
guide catheter inside of which was an 021 150 cm Trevo ProVue
microcatheter combination was advanced over a 0.014 inch Softip
Synchro micro to the distal end of the FlowGate guide catheter.

Thereafter, using biplane roadmap technique and constant
fluoroscopic guidance, using a torque device, micro guidewire access
through the occluded right internal carotid artery was obtained
without difficulty.

This was then followed by the advancement of the microcatheter over
the micro guidewire to the cavernous segment of the right internal
carotid artery. Advancement of the Navien guide catheter passed the
bulb experienced significant resistance on account of the acute
occlusion.

A control arteriogram performed through the microcatheter
demonstrates continued slow flow through the distal right internal
carotid supraclinoid segment, and also the right middle cerebral
artery consistent with occlusion.

Over a 0.014 inch Softip Synchro micro guidewire, the microcatheter
was advanced into the right middle cerebral artery and into the
distal M2 M3 region of the inferior division of the right middle
cerebral artery.

The guidewire was removed. Good aspiration obtained from the hub of
the micro catheter. A gentle contrast injection demonstrated
antegrade flow in the distal M2 M3 region of the inferior division
of the right middle cerebral artery.

The microcatheter was then connected to continuous heparinized
saline infusion.

A 5 mm x 33 mm Embotrap retrieval device was then advanced to the
distal end of the microcatheter. The proximal and the distal landing
zones were then defined. The O ring on the delivery microcatheter
was then loosened.

With slight forward gentle traction with the right hand on the
delivery micro guidewire, with the left hand the delivery
microcatheter was retrieved unsheathing the distal and then the
proximal portion of the retrieval device.

The balloon of the FlowGate guide catheter was now inflated in the
right common carotid artery.

Thereafter constant aspiration being applied with the 60 mL syringe
just at the bulb of the right internal carotid artery, the
combination of the retrieval device, and the microcatheter was then
gently retrieved and removed under constant aspiration.

The retrieval device was seen to contain small bits of clot in its
interstices.

Aspiration was continued as the FlowGate balloon was deflated. Free
back bleed of blood was seen at the hub of the Tuohy Borst of the 8
French FlowGate guide catheter.

A control arteriogram performed through the 8 French FlowGate guide
catheter in the distal right common carotid artery now demonstrated
a channel open in the previously occluded right internal carotid
proximally.

There was no free antegrade flow noted into the distal right
internal carotid artery cavernous and supraclinoid segments. The
right middle cerebral artery branches were widely patent.

A TICI 3 reperfusion of the right middle cerebral artery
distribution had been achieved.

There were 2 focal areas of severe stenosis just distal to the bulb
of the right internal carotid artery.

It was, therefore, decided to place a rescue stent of the previously
acutely occluded right internal carotid artery.

Over a 0.014 inch Softip Synchro micro guidewire, the 021 Trevo
ProVue microcatheter was advanced to the distal end of the 8 French
FlowGate guide catheter.

With the micro guidewire leading with a J-tip configuration, and
using a torque device, the angioplasty site was accessed with a
micro guidewire and then by the microcatheter and advanced distally
to the cervical petrous junction. The guidewire was removed. Good
aspiration was obtained from the hub of the microcatheter. This in
turn was then exchanged for a 300 cm 0.014 inch Softip Transend
exchange micro guidewire.

The exchange micro guidewire had a J configuration to avoid
dissections or inducing spasm.

A control arteriogram performed following removal of the
microcatheter demonstrated continued patency of the angioplasty site
with flow noted distally passed the distal end of the exchange micro
guidewire in the petrous segment of the right internal carotid
artery.

A 8/6 mm x 40 mm Xact stent system was then retrogradely purged with
heparinized saline infusion.

Thereafter again using the rapid exchange technique, the delivery
apparatus was advanced using biplane roadmap technique and constant
fluoroscopic guidance in a coaxial manner and with constant
heparinized saline infusion and position with the distal and the
proximal markers which had been already defined. Thereafter, the
stent was deployed without difficulty with excellent apposition and
coverage distally and proximally.

A control arteriogram was then performed through the 8 French
FlowGate guide catheter in the right common carotid artery.

This demonstrated no evidence of intraluminal filling defects with
the right external carotid artery and its branches being widely
patent.

Moderate to moderately severe spasm was noted in the right internal
carotid artery just distal to the stent. The patient was given over
aliquots of 25 mcg of nitroglycerin intra-arterially, and also a
total of 5 mg of verapamil intra-arterially through the guide
catheter into the stented segment distally. Over the next 30-40
minutes, this demonstrated mild to marginal improvement with gradual
opening of the vessel and its middle and distal [DATE].

A control arteriogram performed intracranially continued to
demonstrate a TICI 3 reperfusion of the right middle cerebral artery
distribution.

There continued be minimal visualization of the right anterior
cerebral artery A1 segment.

Approximately an hour after the placement of the stent, a control
arteriogram performed through the FlowGate guide catheter in the
right common carotid artery demonstrated marginal improvement in the
caliber of the previously noted spasm of the mid [DATE] of the right
internal carotid artery. Patency within the stent. Wide and
unchanged. No change was noted intracranially.

The 8 French FlowGate catheter was then retrieved and removed.

This was then replaced by a 5 French JB 1 catheter which was
advanced to the left common carotid artery, and the left common
carotid artery.

The left common carotid bifurcation in the left external carotid
artery and its major branches appeared removed with the successful
application of an Angio-Seal closure device. At the end of
procedure, the right common femoral puncture site was soft without
evidence of a hematoma or bleeding. Distal pulses were Dopplerable
in the dorsalis pedis, and the posterior tibial regions bilaterally
unchanged from prior to the procedure.

The left internal carotid artery just distal to the bulb at
approximately 45% stenosis by the NASCET criteria. No acute
ulcerations were seen.

The vessel distal to this was seen to opoacify widely to the cranial
skull base. The petrous, the cavernous and the supraclinoid segments
demonstrate wide patency.

The left middle cerebral artery and the left anterior cerebral
artery were seen to opacify normally into the capillary and venous
phases. Prompt cross filling via the anterior communicating artery
of the right anterior cerebral A2 segment and distally, and a
hypoplastic right A1 segment with flash filling of the right MCA
distribution was noted.

A left posterior communicating artery was seen to opacify the left
posterior cerebral distribution.

A follow-up of a right common carotid arteriogram centered extra
cranially again demonstrated modest improvement of the spasm noted
distal to the distal tip of the internal carotid artery stented
segment.

The 8 French neurovascular sheath was then retrieved into the
abdominal aorta and exchanged over a J-tip guidewire for an 8 French
Pinnacle sheath.

The 8 French Pinnacle sheath was removed with the successful
application of an 8 French Angio-Seal closure device.

The right common femoral puncture site was soft without evidence of
a hematoma or bleeding. Distal pulses were Dopplerable in the
dorsalis pedis, and posterior tibial regions bilaterally unchanged
from prior to the procedure.

A Dyna CT of the head performed demonstrated no evidence of an
intracranial hemorrhage, mass effect or midline shift.

The patient's general anesthesia was reversed and the patient was
then extubated without difficulty. Upon recovery the patient was
able to move his left leg and left arm gradually. The right gaze
deviation was less prominent.

The patient was then transferred to the PACU and then to the neuro
ICU for further management.

The patient was loaded with aspirin 81 mg, and Brilinta 180 mg 20
minutes prior to the placement of the right internal carotid artery
stent. Also the patient received a total of 5 mg of intra-arterial
verapamil, and also intra-arterial nitroglycerin as described above.
IMPRESSION: Status post endovascular complete revascularization of occluded
right middle cerebral artery M1 segment with 1 pass with a 5 mm x 33
mm Embotrap retrieval device achieving a reperfusion.

Status post endovascular revascularization of acutely occluded
symptomatic right internal carotid artery proximally with stent
assisted angioplasty.

PLAN:
Patient will be followed in the outpatient clinic 4 weeks following
his discharge.

## 2020-04-22 ENCOUNTER — Ambulatory Visit: Payer: Medicaid Other | Admitting: Internal Medicine

## 2020-05-04 ENCOUNTER — Ambulatory Visit: Payer: Medicaid Other | Admitting: Physician Assistant

## 2020-05-04 NOTE — Progress Notes (Deleted)
Cardiology Office Note:    Date:  05/04/2020   ID:  [name], DOB [birth year], MRN [id number]  PCP:  Antony Blackbird, MD (Inactive)   [place]  Cardiologist:  Sherren Mocha, MD *** Advanced Practice Provider:  No care team member to display Electrophysiologist:  None  {Press F2 to show EP APP, CHF, sleep or structural heart MD               :[id number]}  { Click here to update then REFRESH NOTE - MD (PCP) or APP (Team Member)  Change PCP Type for MD, Specialty for APP is either Cardiology or Clinical Cardiac Electrophysiology  :[id number]}   Referring MD: No ref. provider found   Chief Complaint:  No chief complaint on file.    Patient Profile:    Kevin Barton is a 60 y.o. male with:   Coronary artery disease  ? Known CTO of the RCA w/ L-R collats >> med Rx  Hx of CVA (R MCA) in 8/19 >> tx w/ tPA, endovascular intervention/ICA stent  Polysubstance abuse  Hypertension   Hyperlipidemia   Tobacco abuse   Prior CV studies: Carotid US 05/08/2018 RICA stent patent; L 1-39  Carotid US 10/07/17 Final Interpretation: Right Carotid: Right ICA stent <50% stenosis. Left Carotid: Velocities in the left ICA are consistent with a 1-39% stenosis. Vertebrals: Bilateral vertebral arteries demonstrate antegrade flow.  Echocardiogram 10/06/2017 Moderate LVH, EF 65-70, normal wall motion, grade 2 diastolic dysfunction, mild MAC, trivial MR, normal RV SF  Cardiac catheterization 07/25/2016 LAD mild disease RI mild disease LCx ostial 30 RCA mid 100 with L-R collaterals (first septal perforator) EF 45-50 1. Severe single vessel CAD with total occlusion of the RCA, collateralized from the left coronary artery 2. Mild nonobstructive plaque in the left main, LAD, and LCx 3. Mild segmental LV dysfunction, LVEF 50% Recommend: medical therapy  History of Present Illness:    Kevin Barton was last seen in 7/21.  He returns for f/u.  ***      Past Medical  History:  Diagnosis Date  . Arthritis   . CAD (coronary artery disease)    cath 06/2016 occlusion of RCA with collateralization from the left coronary artery. Mild nonobstructive plaque in the left main, LAD, and LCx. LVEF 50%. TX Rx.   . Chronic back pain   . Chronic knee pain   . HTN (hypertension)   . Hyperlipidemia   . NSTEMI (non-ST elevated myocardial infarction) ([place])   . Stroke ([place])   . Tobacco abuse     Current Medications: No outpatient medications have been marked as taking for the 05/04/20 encounter (Appointment) with Richardson Dopp T, PA-C.     Allergies:   Patient has no known allergies.   Social History   Tobacco Use  . Smoking status: Current Every Day Smoker    Packs/day: 0.50    Types: Cigarettes  . Smokeless tobacco: Never Used  . Tobacco comment: smoke 6 per week  Vaping Use  . Vaping Use: Never used  Substance Use Topics  . Alcohol use: Yes    Comment: a beer a day  . Drug use: No     Family Hx: The patient's family history includes Diabetes in his brother.  ROS   EKGs/Labs/Other Test Reviewed:    EKG:  EKG is *** ordered today.  The ekg ordered today demonstrates ***  Recent Labs: 09/10/2019: ALT 18; BUN 10; Creatinine, Ser 0.96; Hemoglobin 15.0; Platelets 223; Potassium 4.4; Sodium  139   Recent Lipid Panel Lab Results  Component Value Date/Time   CHOL 250 (H) 09/10/2019 04:03 PM   TRIG 146 09/10/2019 04:03 PM   HDL 92 09/10/2019 04:03 PM   CHOLHDL 2.7 09/10/2019 04:03 PM   CHOLHDL 3.3 10/06/2017 05:00 AM   LDLCALC 133 (H) 09/10/2019 04:03 PM      Risk Assessment/Calculations:   {Does this patient have ATRIAL FIBRILLATION?[phone]}  Physical Exam:    VS:  There were no vitals taken for this visit.    Wt Readings from Last 3 Encounters:  09/16/19 174 lb (78.9 kg)  09/10/19 174 lb 3.2 oz (79 kg)  08/14/19 168 lb 12.8 oz (76.6 kg)     Physical Exam ***  ASSESSMENT & PLAN:    *** 1. Preoperative cardiovascular examination    Kevin Barton perioperative risk of a major cardiac event is 6.6% according to the Revised Cardiac Risk Index (RCRI).  Therefore, he is at high risk for perioperative complications.   His functional capacity is fair at 4.31 METs according to the Duke Activity Status Index (DASI). Recommendations: According to ACC/AHA guidelines, no further cardiovascular testing needed.  The patient may proceed to surgery at acceptable risk.   Antiplatelet and/or Anticoagulation Recommendations: The patient should remain on Aspirin without interruption.   I did review his case with one of the PAs with Dr. Estanislado Pandy with IR.  I have also reviewed notes from IR in his chart dated 04/10/2018.  Per IR, he can hold Ticagrelor for his surgery.  Of note, he has not been on it for the last year or more.  Dr. Arlean Hopping office will contact him to and let him know if he needs to restart this.  If he needs to restart Ticagrelor, it can be held for 5-7 days prior to his surgery and resumed as soon as possible post op.  2. Coronary artery disease involving native coronary artery of native heart without angina pectoris Cardiac catheterization in May 2018 demonstrated a totally occluded RCA with left-to-right collaterals and mild nonobstructive disease elsewhere.  He is doing well without anginal symptoms.  Continue ASA, statin.   3. History of stroke Right MCA stroke in August 2019 which was treated with endovascular intervention of the right MCA as well as stenting of an occluded right ICA.  As noted, I have discussed his case today with interventional radiology.  He will be contacted by interventional radiology if he needs to resume Ticagrelor.  4. Essential hypertension Fair control.  Continue current dose of amlodipine.  5. Mixed hyperlipidemia Atorvastatin was recently started.  Continue high intensity statin therapy.  6. Tobacco abuse He is trying to quit.  7. Homelessness He is living in a hotel and has  limited income.  I will reach out to our social work team to see if there is anything we can help with. {Are you ordering a CV Procedure (e.g. stress test, cath, DCCV, TEE, etc)?   Press F2        :[id number]}    Dispo:  No follow-ups on file.   Medication Adjustments/Labs and Tests Ordered: Current medicines are reviewed at length with the patient today.  Concerns regarding medicines are outlined above.  Tests Ordered: No orders of the defined types were placed in this encounter.  Medication Changes: No orders of the defined types were placed in this encounter.   Signed, Richardson Dopp, PA-C  05/04/2020 1:32 PM    [facility] [place],  [place]  [address] Phone: [phone]; Fax: [phone]

## 2020-06-14 ENCOUNTER — Ambulatory Visit: Payer: Medicaid Other | Admitting: Internal Medicine

## 2020-06-16 ENCOUNTER — Ambulatory Visit: Payer: Medicaid Other | Admitting: Internal Medicine

## 2020-06-21 NOTE — Progress Notes (Deleted)
Cardiology Office Note:    Date:  06/21/2020   ID:  Kevin Barton, DOB 1960/05/05, MRN [id number]  PCP:  Antony Blackbird, MD (Inactive)   [place]  Cardiologist:  Sherren Mocha, MD *** Advanced Practice Provider:  No care team member to display Electrophysiologist:  None  {Press F2 to show EP APP, CHF, sleep or structural heart MD               :[id number]}  { Click here to update then REFRESH NOTE - MD (PCP) or APP (Team Member)  Change PCP Type for MD, Specialty for APP is either Cardiology or Clinical Cardiac Electrophysiology  :[id number]}   Referring MD: No ref. provider found   Chief Complaint:  No chief complaint on file.    Patient Profile:    Kevin Barton is a 60 y.o. male with:   Coronary artery disease  ? Known CTO of the RCA w/ L-R collats >> med Rx  Hx of CVA (R MCA) in 8/19 >> tx w/ tPA, endovascular intervention/ICA stent  Polysubstance abuse  Hypertension   Hyperlipidemia   Tobacco abuse   Prior CV studies: Carotid US 05/08/2018 RICA stent patent; L 1-39  Carotid US 10/07/17 Final Interpretation: Right Carotid: Right ICA stent <50% stenosis. Left Carotid: Velocities in the left ICA are consistent with a 1-39% stenosis. Vertebrals: Bilateral vertebral arteries demonstrate antegrade flow.  Echocardiogram 10/06/2017 Moderate LVH, EF 65-70, normal wall motion, grade 2 diastolic dysfunction, mild MAC, trivial MR, normal RV SF  Cardiac catheterization 07/25/2016 LAD mild disease RI mild disease LCx ostial 30 RCA mid 100 with L-R collaterals (first septal perforator) EF 45-50 1. Severe single vessel CAD with total occlusion of the RCA, collateralized from the left coronary artery 2. Mild nonobstructive plaque in the left main, LAD, and LCx 3. Mild segmental LV dysfunction, LVEF 50% Recommend: medical therapy    History of Present Illness: Kevin Barton was last seen in 7/21 for surgical clearance for R TKR.  He returns for  f/u. ***        Past Medical History:  Diagnosis Date  . Arthritis   . CAD (coronary artery disease)    cath 06/2016 occlusion of RCA with collateralization from the left coronary artery. Mild nonobstructive plaque in the left main, LAD, and LCx. LVEF 50%. TX Rx.   . Chronic back pain   . Chronic knee pain   . HTN (hypertension)   . Hyperlipidemia   . NSTEMI (non-ST elevated myocardial infarction) ([place])   . Stroke ([place])   . Tobacco abuse     Current Medications: No outpatient medications have been marked as taking for the 06/22/20 encounter (Appointment) with Richardson Dopp T, PA-C.     Allergies:   Patient has no known allergies.   Social History   Tobacco Use  . Smoking status: Current Every Day Smoker    Packs/day: 0.50    Types: Cigarettes  . Smokeless tobacco: Never Used  . Tobacco comment: smoke 6 per week  Vaping Use  . Vaping Use: Never used  Substance Use Topics  . Alcohol use: Yes    Comment: a beer a day  . Drug use: No     Family Hx: The patient's family history includes Diabetes in his brother.  ROS   EKGs/Labs/Other Test Reviewed:    EKG:  EKG is *** ordered today.  The ekg ordered today demonstrates ***  Recent Labs: 09/10/2019: ALT 18; BUN 10; Creatinine, Ser 0.96; Hemoglobin  15.0; Platelets 223; Potassium 4.4; Sodium 139   Recent Lipid Panel Lab Results  Component Value Date/Time   CHOL 250 (H) 09/10/2019 04:03 PM   TRIG 146 09/10/2019 04:03 PM   HDL 92 09/10/2019 04:03 PM   CHOLHDL 2.7 09/10/2019 04:03 PM   CHOLHDL 3.3 10/06/2017 05:00 AM   LDLCALC 133 (H) 09/10/2019 04:03 PM      Risk Assessment/Calculations:   {Does this patient have ATRIAL FIBRILLATION?:[phone]}  Physical Exam:    VS:  There were no vitals taken for this visit.    Wt Readings from Last 3 Encounters:  09/16/19 174 lb (78.9 kg)  09/10/19 174 lb 3.2 oz (79 kg)  08/14/19 168 lb 12.8 oz (76.6 kg)     Physical Exam ***     ASSESSMENT & PLAN:    *** 1.  Preoperative cardiovascular examination   [name] perioperative risk of a major cardiac event is 6.6% according to the Revised Cardiac Risk Index (RCRI).  Therefore, he is at high risk for perioperative complications.   His functional capacity is fair at 4.31 METs according to the Duke Activity Status Index (DASI). Recommendations: According to ACC/AHA guidelines, no further cardiovascular testing needed.  The patient may proceed to surgery at acceptable risk.   Antiplatelet and/or Anticoagulation Recommendations: The patient should remain on Aspirin without interruption.   I did review his case with one of the PAs with Dr. Estanislado Pandy with IR.  I have also reviewed notes from IR in his chart dated 04/10/2018.  Per IR, he can hold Ticagrelor for his surgery.  Of note, he has not been on it for the last year or more.  Dr. Arlean Hopping office will contact him to and let him know if he needs to restart this.  If he needs to restart Ticagrelor, it can be held for 5-7 days prior to his surgery and resumed as soon as possible post op.  2. Coronary artery disease involving native coronary artery of native heart without angina pectoris Cardiac catheterization in May 2018 demonstrated a totally occluded RCA with left-to-right collaterals and mild nonobstructive disease elsewhere.  He is doing well without anginal symptoms.  Continue ASA, statin.   3. History of stroke Right MCA stroke in August 2019 which was treated with endovascular intervention of the right MCA as well as stenting of an occluded right ICA.  As noted, I have discussed his case today with interventional radiology.  He will be contacted by interventional radiology if he needs to resume Ticagrelor.  4. Essential hypertension Fair control.  Continue current dose of amlodipine.  5. Mixed hyperlipidemia Atorvastatin was recently started.  Continue high intensity statin therapy.  6. Tobacco abuse He is trying to quit.  7.  Homelessness He is living in a hotel and has limited income.  I will reach out to our social work team to see if there is anything we can help with. {Are you ordering a CV Procedure (e.g. stress test, cath, DCCV, TEE, etc)?   Press F2        :[id number]}    Dispo:  No follow-ups on file.   Medication Adjustments/Labs and Tests Ordered: Current medicines are reviewed at length with the patient today.  Concerns regarding medicines are outlined above.  Tests Ordered: No orders of the defined types were placed in this encounter.  Medication Changes: No orders of the defined types were placed in this encounter.   Signed, Richardson Dopp, PA-C  06/21/2020 10:39 PM    [facility] [address], [place], [place]  [address] Phone: [phone]; Fax: [phone]

## 2020-06-22 ENCOUNTER — Ambulatory Visit: Payer: Medicaid Other | Admitting: Physician Assistant

## 2020-06-22 DIAGNOSIS — Z8673 Personal history of transient ischemic attack (TIA), and cerebral infarction without residual deficits: Secondary | ICD-10-CM

## 2020-06-22 DIAGNOSIS — Z5900 Homelessness unspecified: Secondary | ICD-10-CM

## 2020-06-22 DIAGNOSIS — I10 Essential (primary) hypertension: Secondary | ICD-10-CM

## 2020-06-22 DIAGNOSIS — Z720 Tobacco use: Secondary | ICD-10-CM

## 2020-06-22 DIAGNOSIS — E782 Mixed hyperlipidemia: Secondary | ICD-10-CM

## 2020-06-22 DIAGNOSIS — I2510 Atherosclerotic heart disease of native coronary artery without angina pectoris: Secondary | ICD-10-CM

## 2020-08-12 ENCOUNTER — Ambulatory Visit: Payer: Medicaid Other | Admitting: Internal Medicine

## 2021-01-10 ENCOUNTER — Emergency Department (HOSPITAL_COMMUNITY)
Admission: EM | Admit: 2021-01-10 | Discharge: 2021-01-10 | Disposition: A | Discharge status: Home / Self Care | Payer: Medicaid Other | Attending: Emergency Medicine | Admitting: Emergency Medicine

## 2021-01-10 ENCOUNTER — Other Ambulatory Visit: Payer: Self-pay

## 2021-01-10 DIAGNOSIS — I10 Essential (primary) hypertension: Secondary | ICD-10-CM | POA: Diagnosis not present

## 2021-01-10 DIAGNOSIS — Z79899 Other long term (current) drug therapy: Secondary | ICD-10-CM | POA: Insufficient documentation

## 2021-01-10 DIAGNOSIS — Z7982 Long term (current) use of aspirin: Secondary | ICD-10-CM | POA: Diagnosis not present

## 2021-01-10 DIAGNOSIS — I2510 Atherosclerotic heart disease of native coronary artery without angina pectoris: Secondary | ICD-10-CM | POA: Diagnosis not present

## 2021-01-10 DIAGNOSIS — H65192 Other acute nonsuppurative otitis media, left ear: Secondary | ICD-10-CM | POA: Diagnosis not present

## 2021-01-10 DIAGNOSIS — H9202 Otalgia, left ear: Secondary | ICD-10-CM | POA: Diagnosis not present

## 2021-01-10 DIAGNOSIS — F17210 Nicotine dependence, cigarettes, uncomplicated: Secondary | ICD-10-CM | POA: Insufficient documentation

## 2021-01-10 LAB — BASIC METABOLIC PANEL
Anion gap: 8 (ref 5–15)
BUN: 10 mg/dL (ref 6–20)
CO2: 28 mmol/L (ref 22–32)
Calcium: 9.5 mg/dL (ref 8.9–10.3)
Chloride: 103 mmol/L (ref 98–111)
Creatinine, Ser: 1.09 mg/dL (ref 0.61–1.24)
GFR, Estimated: >60 mL/min (ref >60)
Glucose, Bld: 90 mg/dL (ref 70–99)
Potassium: 4.1 mmol/L (ref 3.5–5.1)
Sodium: 139 mmol/L (ref 135–145)

## 2021-01-10 LAB — CBC WITH DIFFERENTIAL/PLATELET
Abs Immature Granulocytes: 0.01 10*3/uL (ref 0.00–0.07)
Basophils Absolute: 0.1 10*3/uL (ref 0.0–0.1)
Basophils Relative: 1 %
Eosinophils Absolute: 0.4 10*3/uL (ref 0.0–0.5)
Eosinophils Relative: 6 %
HCT: 44.5 % (ref 39.0–52.0)
Hemoglobin: 15.5 g/dL (ref 13.0–17.0)
Immature Granulocytes: 0 %
Lymphocytes Relative: 41 %
Lymphs Abs: 2.5 10*3/uL (ref 0.7–4.0)
MCH: 31.6 pg (ref 26.0–34.0)
MCHC: 34.8 g/dL (ref 30.0–36.0)
MCV: 90.8 fL (ref 80.0–100.0)
Monocytes Absolute: 1 10*3/uL (ref 0.1–1.0)
Monocytes Relative: 16 %
Neutro Abs: 2.2 10*3/uL (ref 1.7–7.7)
Neutrophils Relative %: 36 %
Platelets: 265 10*3/uL (ref 150–400)
RBC: 4.9 MIL/uL (ref 4.22–5.81)
RDW: 14.7 % (ref 11.5–15.5)
WBC: 6 10*3/uL (ref 4.0–10.5)
nRBC: 0 % (ref 0.0–0.2)

## 2021-01-10 LAB — C-REACTIVE PROTEIN: CRP: 0.9 mg/dL (ref <1.0)

## 2021-01-10 LAB — SEDIMENTATION RATE: Sed Rate: 8 mm/hr (ref 0–16)

## 2021-01-10 MED ORDER — OXYCODONE-ACETAMINOPHEN 5-325 MG PO TABS
1.0000 | ORAL_TABLET | Freq: Once | ORAL | Status: AC
  Administered 2021-01-10: 1 via ORAL
  Filled 2021-01-10: qty 1

## 2021-01-10 MED ORDER — AMOXICILLIN 500 MG PO CAPS
1000.0000 mg | ORAL_CAPSULE | Freq: Once | ORAL | Status: AC
  Administered 2021-01-10: 1000 mg via ORAL
  Filled 2021-01-10: qty 2

## 2021-01-10 MED ORDER — KETOROLAC TROMETHAMINE 15 MG/ML IJ SOLN
15.0000 mg | Freq: Once | INTRAMUSCULAR | Status: AC
  Administered 2021-01-10: 15 mg via INTRAMUSCULAR
  Filled 2021-01-10: qty 1

## 2021-01-10 MED ORDER — AMOXICILLIN 500 MG PO TABS: 1000.0000 mg | ORAL_TABLET | Freq: Two times a day (BID) | ORAL | 0 refills | Status: DC

## 2021-01-10 MED ORDER — PREDNISONE 20 MG PO TABS
60.0000 mg | ORAL_TABLET | Freq: Once | ORAL | Status: AC
  Administered 2021-01-10: 60 mg via ORAL
  Filled 2021-01-10: qty 3

## 2021-01-10 MED ORDER — METHYLPREDNISOLONE 4 MG PO TBPK: ORAL_TABLET | ORAL | 0 refills | Status: DC

## 2021-01-10 NOTE — ED Triage Notes (Signed)
Pt with L ear pain x several years. Taking OTC at home without relief.

## 2021-01-10 NOTE — Discharge Instructions (Signed)
Take 4 over the counter ibuprofen tablets 3 times a day or 2 over-the-counter naproxen tablets twice a day for pain. Also take tylenol 1000mg(2 extra strength) four times a day.    Follow up with your family doc.    

## 2021-01-10 NOTE — ED Provider Notes (Signed)
[facility] EMERGENCY DEPARTMENT Provider Note   CSN: [id number] Arrival date & time: 01/10/21  1247     History Chief Complaint  Patient presents with   Otalgia    Kevin Barton is a 60 y.o. male.  60 yo M with a chief complaints of bilateral facial pain.  This seems to come and go.  Started about 3 weeks ago on the left side.  States it comes from his ear and radiates to the frontal part of the face and then also he feels it in his mouth.  Nothing seems to make this better or worse.  He does not take anything in particular for this.  States he came in was seen in the ED previously and he thinks he was prescribed an antibiotic and made it better.  Denies cough congestion or fever.  Denies trauma.  Denies neck pain.  The history is provided by the patient.  Otalgia Location:  Left Behind ear:  No abnormality Quality:  Sharp and shooting Severity:  Severe Onset quality:  Gradual Duration:  3 weeks Timing:  Intermittent Progression:  Waxing and waning Chronicity:  Recurrent Relieved by:  Nothing Worsened by:  Nothing Ineffective treatments:  None tried Associated symptoms: no abdominal pain, no congestion, no diarrhea, no fever, no headaches, no rash and no vomiting       Past Medical History:  Diagnosis Date   Arthritis    CAD (coronary artery disease)    cath 06/2016 occlusion of RCA with collateralization from the left coronary artery. Mild nonobstructive plaque in the left main, LAD, and LCx. LVEF 50%. TX Rx.    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 10/08/2017   Cocaine use 10/08/2017   Intracranial atherosclerosis 10/08/2017   Bradycardia 10/08/2017   Alcohol use 10/08/2017   Obesity (BMI 30.0-34.9) 10/08/2017   Acute blood loss anemia 10/08/2017   Cerebral infarction due to stenosis of right middle cerebral artery (HCC) s/p tPA and mechanical thrombectomy 10/05/2017   CAD (coronary artery disease) 04/28/2017   Essential (primary) hypertension 04/28/2017   Tobacco abuse 04/28/2017   Hyperlipidemia LDL goal <70 04/28/2017   NSTEMI (non-ST elevated myocardial infarction) [facility])     Past Surgical History:  Procedure Laterality Date   IR ANGIO INTRA EXTRACRAN SEL COM CAROTID INNOMINATE UNI L MOD SED  10/05/2017   IR ANGIO VERTEBRAL SEL SUBCLAVIAN INNOMINATE UNI R MOD SED  10/05/2017   IR CT HEAD LTD  10/05/2017   IR INTRAVSC STENT CERV CAROTID W/O EMB-PROT MOD SED INC ANGIO  10/05/2017   IR PERCUTANEOUS ART THROMBECTOMY/INFUSION INTRACRANIAL INC DIAG ANGIO  10/05/2017   LEFT HEART CATH AND CORONARY ANGIOGRAPHY N/A 07/25/2016   Procedure: Left Heart Cath and Coronary Angiography;  Surgeon: Sherren Mocha, MD;  Location: [facility];  Service: Cardiovascular;  Laterality: N/A;   RADIOLOGY WITH ANESTHESIA N/A 10/05/2017   Procedure: IR WITH ANESTHESIA CODE STROKE;  Surgeon:  Luanne Bras, MD;  Location: [facility];  Service: Radiology;  Laterality: N/A;       Family History  Problem Relation Age of Onset   Diabetes Brother     Social History   Tobacco Use   Smoking status: Every Day    Packs/day: 0.50    Types: Cigarettes   Smokeless tobacco: Never   Tobacco comments:    smoke 6 per week  Vaping Use   Vaping Use: Never used  Substance Use Topics   Alcohol use: Yes    Comment: a beer a day   Drug use: No    Home Medications Prior to Admission medications   Medication Sig Start Date End Date Taking? Authorizing  Provider  amoxicillin (AMOXIL) 500 MG tablet Take 2 tablets (1,000 mg total) by mouth 2 (two) times daily. 01/10/21  Yes Deno Etienne, DO  methylPREDNISolone (MEDROL DOSEPAK) 4 MG TBPK tablet Day 1: 8mg  before breakfast, 4 mg after lunch, 4 mg after supper, and 8 mg at bedtime Day 2: 4 mg before breakfast, 4 mg after lunch, 4 mg  after supper, and 8 mg  at bedtime Day 3:  4 mg  before breakfast, 4 mg  after lunch, 4 mg after supper, and 4 mg  at bedtime Day 4: 4 mg  before breakfast, 4 mg  after lunch, and 4 mg at bedtime Day 5: 4 mg  before breakfast and 4 mg at bedtime Day 6: 4 mg  before breakfast 01/10/21  Yes Deno Etienne, DO  amLODipine (NORVASC) 5 MG tablet Take 1 tablet (5 mg total) by mouth daily. 09/10/19   Ladell Pier, MD  aspirin EC 81 MG tablet Take 1 tablet (81 mg total) by mouth daily. 09/10/19   Ladell Pier, MD  atorvastatin (LIPITOR) 80 MG tablet Take 1 tablet (80 mg total) by mouth daily at 6 PM. 10/12/19   Ladell Pier, MD  ticagrelor (BRILINTA) 90 MG TABS tablet Take 1 tablet (90 mg total) by mouth 2 (two) times daily. 03/26/18   Richardson Dopp T, PA-C  varenicline (CHANTIX STARTING MONTH PAK) 0.5 MG X 11 & 1 MG X 42 tablet Take one 0.5 mg tablet by mouth once daily for 3 days, then increase to one 0.5 mg tablet twice daily for 4 days, then increase to one 1 mg tablet twice daily. 10/12/19   Ladell Pier, MD    Allergies    Patient has no known allergies.  Review of Systems   Review of Systems  Constitutional:  Negative for chills and fever.  HENT:  Positive for ear pain. Negative for congestion and facial swelling.   Eyes:  Negative for discharge and visual disturbance.  Respiratory:  Negative for shortness of breath.   Cardiovascular:  Negative for chest pain and palpitations.  Gastrointestinal:  Negative for abdominal pain, diarrhea and vomiting.  Musculoskeletal:  Negative for arthralgias and myalgias.  Skin:  Negative for color change and rash.   Neurological:  Negative for tremors, syncope and headaches.  Psychiatric/Behavioral:  Negative for confusion and dysphoric mood.    Physical Exam Updated Vital Signs BP (!) 197/98 (BP Location: Right Arm)   Pulse 75   Temp 98.2 F (36.8 C) (Oral)   Resp 19   SpO2 98%   Physical Exam Vitals and nursing note reviewed.  Constitutional:      Appearance: He is well-developed.  HENT:     Head: Normocephalic and atraumatic.     Comments: Left TM  with some erythema and a serous appearing effusion.  No distortion of landmarks.  Right TM is normal.  No appreciable rash of the face.  No TMJ tenderness.  Patient has very poor dentition diffusely but no obvious tenderness on percussion of any of his teeth.  No sublingual swelling no cervical lymphadenopathy. Eyes:     Pupils: Pupils are equal, round, and reactive to light.  Neck:     Vascular: No JVD.  Cardiovascular:     Rate and Rhythm: Normal rate and regular rhythm.     Heart sounds: No murmur heard.   No friction rub. No gallop.  Pulmonary:     Effort: No respiratory distress.     Breath sounds: No wheezing.  Abdominal:     General: There is no distension.     Tenderness: There is no abdominal tenderness. There is no guarding or rebound.  Musculoskeletal:        General: Normal range of motion.     Cervical back: Normal range of motion and neck supple.  Skin:    Coloration: Skin is not pale.     Findings: No rash.  Neurological:     Mental Status: He is alert and oriented to person, place, and time.  Psychiatric:        Behavior: Behavior normal.    ED Results / Procedures / Treatments   Labs (all labs ordered are listed, but only abnormal results are displayed) Labs Reviewed  CBC WITH DIFFERENTIAL/PLATELET  BASIC METABOLIC PANEL  SEDIMENTATION RATE  C-REACTIVE PROTEIN    EKG None  Radiology No results found.  Procedures Procedures   Medications Ordered in ED Medications  oxyCODONE-acetaminophen  (PERCOCET/ROXICET) 5-325 MG per tablet 1 tablet (1 tablet Oral Given 01/10/21 1450)  predniSONE (DELTASONE) tablet 60 mg (60 mg Oral Given 01/10/21 1843)  amoxicillin (AMOXIL) capsule 1,000 mg (1,000 mg Oral Given 01/10/21 1843)  ketorolac (TORADOL) 15 MG/ML injection 15 mg (15 mg Intramuscular Given 01/10/21 1844)    ED Course  I have reviewed the triage vital signs and the nursing notes.  Pertinent labs & imaging results that were available during my care of the patient were reviewed by me and considered in my medical decision making (see chart for details).    MDM Rules/Calculators/A&P                           60 yo M with a chief complaints of what sounds like trigeminal neuralgia.  Has been occurring off and on for many years.  Worsening over the past few days.  Reportedly improved with antibiotics in the past.  On exam the patient does have some signs of changes to the left TM.  Will start on a course of antibiotics.  Medrol Dosepak.  PCP follow-up.  6:46 PM:  I have discussed the diagnosis/risks/treatment options with the patient and believe the pt to be eligible for discharge home to follow-up with PCP. We also discussed returning to the ED immediately if new or worsening sx occur. We discussed the sx which are most concerning (e.g., sudden worsening pain, fever, inability to tolerate by mouth) that necessitate immediate return. Medications administered to the patient during their visit and any new prescriptions provided to the patient are listed below.  Medications given during this visit Medications  oxyCODONE-acetaminophen (PERCOCET/ROXICET) 5-325 MG per tablet 1 tablet (1 tablet Oral Given 01/10/21 1450)  predniSONE (DELTASONE) tablet 60 mg (60 mg Oral Given 01/10/21 1843)  amoxicillin (AMOXIL) capsule 1,000 mg (1,000 mg Oral Given 01/10/21 1843)  ketorolac (TORADOL) 15 MG/ML injection 15 mg (15 mg Intramuscular Given 01/10/21 1844)     The patient appears reasonably screen  and/or stabilized for discharge and I doubt any other medical condition or other [facility] requiring further screening, evaluation, or treatment in the ED at this time prior to discharge.   Final Clinical Impression(s) / ED Diagnoses Final diagnoses:  Otalgia of left ear    Rx / DC Orders ED Discharge Orders          Ordered    amoxicillin (AMOXIL) 500 MG tablet  2 times daily        01/10/21 1837    methylPREDNISolone (MEDROL DOSEPAK) 4 MG TBPK tablet        01/10/21 1837             Melene Plan, DO 01/10/21 1846

## 2021-01-10 NOTE — ED Provider Notes (Signed)
Emergency Medicine Provider Triage Evaluation Note  Kevin Barton , a 60 y.o. male  was evaluated in triage.  Pt complains of L ear pain for several years.  Patient states that he has intermittently had ear pain, but in the past 3 weeks it is more severe.  He states that it intermittently changes ears, and often has associated decreased hearing.  He says that the pain feels like an electric shock up to his forehead and down to his jaw.  Review of Systems  Positive: Ear pain, hearing loss Negative: Fevers, chills, vision changes, jaw pain, sore throat or difficulty swallowing, ear drainage  Physical Exam  BP (!) 176/93   Pulse 77   Temp 99.1 F (37.3 C)   Resp 16   SpO2 99%  Gen:   Awake, no distress   Resp:  Normal effort  MSK:   Moves extremities without difficulty  Other:  No tenderness to palpation of temporal artery or the jaw, no mastoid tenderness  Medical Decision Making  Medically screening exam initiated at 2:45 PM.  Appropriate orders placed.  Kevin Barton was informed that the remainder of the evaluation will be completed by another provider, this initial triage assessment does not replace that evaluation, and the importance of remaining in the ED until their evaluation is complete.  Concern for temporal arteritis versus trigeminal neuralgia   Kevin Monks, PA-C 01/10/21 1449    Kevin Files, MD 01/11/21 1239

## 2021-01-11 ENCOUNTER — Telehealth: Payer: Self-pay

## 2021-01-11 NOTE — Telephone Encounter (Signed)
Transition Care Management Follow-up Telephone Call Date of discharge and from where: 01/10/2021-  How have you been since you were released from the hospital? Patient stated he is doing better.  Any questions or concerns? No  Items Reviewed: Did the pt receive and understand the discharge instructions provided? Yes  Medications obtained and verified? Yes  Other? No  Any new allergies since your discharge? No  Dietary orders reviewed? No Do you have support at home? Yes   Home Care and Equipment/Supplies: Were home health services ordered? not applicable If so, what is the name of the agency? N/A  Has the agency set up a time to come to the patient's home? not applicable Were any new equipment or medical supplies ordered?  No What is the name of the medical supply agency? N/A Were you able to get the supplies/equipment? not applicable Do you have any questions related to the use of the equipment or supplies? No  Functional Questionnaire: (I = Independent and D = Dependent) ADLs: I  Bathing/Dressing- I  Meal Prep- I  Eating- I  Maintaining continence- I  Transferring/Ambulation- I  Managing Meds- I  Follow up appointments reviewed:  PCP Hospital f/u appt confirmed? No   Specialist Hospital f/u appt confirmed? No   Are transportation arrangements needed? No  If their condition worsens, is the pt aware to call PCP or go to the Emergency Dept.? Yes Was the patient provided with contact information for the PCP's office or ED? Yes Was to pt encouraged to call back with questions or concerns? Yes

## 2021-03-06 ENCOUNTER — Encounter (HOSPITAL_COMMUNITY): Payer: Self-pay | Admitting: Emergency Medicine

## 2021-03-06 ENCOUNTER — Emergency Department (HOSPITAL_COMMUNITY): Payer: Medicaid Other

## 2021-03-06 ENCOUNTER — Emergency Department (HOSPITAL_COMMUNITY)
Admission: EM | Admit: 2021-03-06 | Discharge: 2021-03-07 | Disposition: A | Discharge status: Home / Self Care | Payer: Medicaid Other | Attending: Emergency Medicine | Admitting: Emergency Medicine

## 2021-03-06 DIAGNOSIS — Z79899 Other long term (current) drug therapy: Secondary | ICD-10-CM | POA: Insufficient documentation

## 2021-03-06 DIAGNOSIS — Z7982 Long term (current) use of aspirin: Secondary | ICD-10-CM | POA: Insufficient documentation

## 2021-03-06 DIAGNOSIS — I10 Essential (primary) hypertension: Secondary | ICD-10-CM | POA: Diagnosis not present

## 2021-03-06 DIAGNOSIS — I2510 Atherosclerotic heart disease of native coronary artery without angina pectoris: Secondary | ICD-10-CM | POA: Insufficient documentation

## 2021-03-06 DIAGNOSIS — M25561 Pain in right knee: Secondary | ICD-10-CM | POA: Diagnosis not present

## 2021-03-06 DIAGNOSIS — R936 Abnormal findings on diagnostic imaging of limbs: Secondary | ICD-10-CM | POA: Diagnosis not present

## 2021-03-06 DIAGNOSIS — M79671 Pain in right foot: Secondary | ICD-10-CM | POA: Insufficient documentation

## 2021-03-06 DIAGNOSIS — R937 Abnormal findings on diagnostic imaging of other parts of musculoskeletal system: Secondary | ICD-10-CM

## 2021-03-06 DIAGNOSIS — S8991XA Unspecified injury of right lower leg, initial encounter: Secondary | ICD-10-CM | POA: Diagnosis not present

## 2021-03-06 DIAGNOSIS — M79674 Pain in right toe(s): Secondary | ICD-10-CM | POA: Diagnosis not present

## 2021-03-06 DIAGNOSIS — M1711 Unilateral primary osteoarthritis, right knee: Secondary | ICD-10-CM | POA: Diagnosis not present

## 2021-03-06 NOTE — ED Provider Triage Note (Signed)
Emergency Medicine Provider Triage Evaluation Note  Kevin Barton , a 61 y.o. male  was evaluated in triage.  Pt complains of right great toe pain.  Pain is been present over the last week.  Pain started after he stepped on a large rock while wearing thin soled shoes.  Patient has had constant pain since then.  Pain is worse with touch and movement.  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  Review of Systems  Positive: Right great toe pain Negative: Numbness, weakness, color change, wounds, fever, chills  Physical Exam  BP 133/82    Pulse 78    Temp 98.9 F (37.2 C) (Oral)    Resp 16    SpO2 98%  Gen:   Awake, no distress   Resp:  Normal effort  MSK:   Moves extremities without difficulty  Other:  +2 right DP pulse.  Cap refill less than 2 seconds in all digits of right foot.  Sensation intact to all digits of right foot.  Patient has pain to palpation of right great toe.  Erythema noted to right great toe.  Full range of motion to right ankle.  Medical Decision Making  Medically screening exam initiated at 4:14 PM.  Appropriate orders placed.  Kevin Barton was informed that the remainder of the evaluation will be completed by another provider, this initial triage assessment does not replace that evaluation, and the importance of remaining in the ED until their evaluation is complete.     Kevin Barton, [place] 03/06/21 1615

## 2021-03-06 NOTE — ED Triage Notes (Signed)
Patient here with complaint of pain to right great toe that started approximately one week ago after stepping on a large rock. Patient alert, oriented, and in on apparent distress at this time.

## 2021-03-07 ENCOUNTER — Emergency Department (HOSPITAL_COMMUNITY): Payer: Medicaid Other

## 2021-03-07 DIAGNOSIS — M1711 Unilateral primary osteoarthritis, right knee: Secondary | ICD-10-CM | POA: Diagnosis not present

## 2021-03-07 DIAGNOSIS — S8991XA Unspecified injury of right lower leg, initial encounter: Secondary | ICD-10-CM | POA: Diagnosis not present

## 2021-03-07 MED ORDER — KETOROLAC TROMETHAMINE 30 MG/ML IJ SOLN
30.0000 mg | Freq: Once | INTRAMUSCULAR | Status: AC
  Administered 2021-03-07: 30 mg via INTRAMUSCULAR
  Filled 2021-03-07: qty 1

## 2021-03-07 NOTE — ED Provider Notes (Signed)
[facility]   CSN: [id number] Arrival date & time: 03/06/21  1516     History  Chief Complaint  Patient presents with   Toe Pain    Kevin Barton is a 61 y.o. male.   Toe Pain Pertinent negatives include no chest pain and no shortness of breath.   Patient with history of CAD, chronic right knee pain, hyperlipidemia, hypertension, tobacco use disorder presents due to right foot pain.  Started acutely about a week and a half ago, its been constant since stepping on a rock.  The pain is primarily to the dorsal aspect of his right large toe and moves proximally to his foot.  The pain is worse with ambulation, he has not taken any medicine for it but states weightbearing is "impossible due to pain".  He has remote history of gout 8 years ago, denies any previous surgeries to the foot or ankle.  Denies any pain to the anterior shin.  No history of diabetes, has not noticed any discharge or new wounds.  Past Medical History:  Diagnosis Date   Arthritis    CAD (coronary artery disease)    cath 06/2016 occlusion of RCA with collateralization from the left coronary artery. Mild nonobstructive plaque in the left main, LAD, and LCx. LVEF 50%. TX Rx.    Chronic back pain    Chronic knee pain    HTN (hypertension)    Hyperlipidemia    NSTEMI (non-ST elevated myocardial infarction) (HCC)    Stroke (HCC)    Tobacco abuse     Home Medications Prior to Admission medications   Medication Sig Start Date End Date Taking? Authorizing Provider  amLODipine (NORVASC) 5 MG tablet Take 1 tablet (5 mg total) by mouth daily. Patient not taking: Reported on 03/07/2021 09/10/19   Marcine MatarJohnson, Deborah B, MD  amoxicillin (AMOXIL) 500 MG tablet Take 2 tablets (1,000 mg total) by mouth 2 (two) times daily. Patient not taking: Reported on 03/07/2021 01/10/21   Melene PlanFloyd, Dan, DO  aspirin EC 81 MG tablet Take 1 tablet (81 mg total) by mouth daily. Patient not taking:  Reported on 03/07/2021 09/10/19   Marcine MatarJohnson, Deborah B, MD  atorvastatin (LIPITOR) 80 MG tablet Take 1 tablet (80 mg total) by mouth daily at 6 PM. Patient not taking: Reported on 03/07/2021 10/12/19   Marcine MatarJohnson, Deborah B, MD  methylPREDNISolone (MEDROL DOSEPAK) 4 MG TBPK tablet Day 1: 8mg  before breakfast, 4 mg after lunch, 4 mg after supper, and 8 mg at bedtime Day 2: 4 mg before breakfast, 4 mg after lunch, 4 mg  after supper, and 8 mg  at bedtime Day 3:  4 mg  before breakfast, 4 mg  after lunch, 4 mg after supper, and 4 mg  at bedtime Day 4: 4 mg  before breakfast, 4 mg  after lunch, and 4 mg at bedtime Day 5: 4 mg  before breakfast and 4 mg at bedtime Day 6: 4 mg  before breakfast Patient not taking: Reported on 03/07/2021 01/10/21   Melene PlanFloyd, Dan, DO  ticagrelor (BRILINTA) 90 MG TABS tablet Take 1 tablet (90 mg total) by mouth 2 (two) times daily. Patient not taking: Reported on 03/07/2021 03/26/18   Tereso NewcomerWeaver, Scott T, PA-C  varenicline (CHANTIX STARTING MONTH PAK) 0.5 MG X 11 & 1 MG X 42 tablet Take one 0.5 mg tablet by mouth once daily for 3 days, then increase to one 0.5 mg tablet twice daily for 4 days, then increase  to one 1 mg tablet twice daily. Patient not taking: Reported on 03/07/2021 10/12/19   Marcine Matar, MD      Allergies    Patient has no known allergies.    Review of Systems   Review of Systems  Constitutional:  Negative for fever.  Respiratory:  Negative for shortness of breath.   Cardiovascular:  Negative for chest pain.  Gastrointestinal:  Negative for nausea and vomiting.  Musculoskeletal:  Positive for arthralgias, gait problem, joint swelling and myalgias.  Skin:  Positive for color change.   Physical Exam Updated Vital Signs BP (!) 148/78 (BP Location: Right Arm)    Pulse 73    Temp 98 F (36.7 C) (Oral)    Resp 16    SpO2 100%  Physical Exam Vitals and nursing note reviewed. Exam conducted with a chaperone present.  Constitutional:      General: He is not in acute  distress.    Appearance: Normal appearance.  HENT:     Head: Normocephalic and atraumatic.  Eyes:     General: No scleral icterus.    Extraocular Movements: Extraocular movements intact.     Pupils: Pupils are equal, round, and reactive to light.  Musculoskeletal:     Comments: Brace on right knee.  Patient has decreased range of motion to the right knee secondary to chronic pain.  Full range of motion to the ankle both passive and actively, able to flex and extend all of his toes.  He does have tenderness to the dorsal aspect of the right large toe and some slight erythema.  Skin:    Capillary Refill: Capillary refill takes less than 2 seconds.     Coloration: Skin is not jaundiced.     Comments: Ulcerations or wounds noted in between the toes, to the lateral medial malleolus, to the dorsal lateral side of the foot.  Neurological:     Mental Status: He is alert. Mental status is at baseline.     Coordination: Coordination normal.    ED Results / Procedures / Treatments   Labs (all labs ordered are listed, but only abnormal results are displayed) Labs Reviewed - No data to display  EKG None  Radiology DG Tibia/Fibula Right  Result Date: 03/07/2021 CLINICAL DATA:  Fall injury 2 weeks ago with persistent pain since then EXAM: RIGHT TIBIA AND FIBULA - 2 VIEW COMPARISON:  None. FINDINGS: Bone mineralization is normal with no evidence of fractures. There are moderately advanced features of degenerative arthrosis of the knee joint with anterior and posterior osteochondral loose bodies. There are enthesopathic spurs of the anterior surface of the patella and anterior proximal tibia. There are lucent centered lesions with thin sclerotic borders, 1 in the distal tibial diametaphysis measuring 3.6 x 1.3 by 1.8 cm and another in the distal fibular shaft measuring 2.1 x 0.9 cm, without cortical destructive changes. There is a small osteochondroma of the posterior surface of the distal fibula at the  level of the sclerotic lesion, versus alternatively could be posttraumatic chronic cortical deformity. There are patchy calcifications in the popliteal trifurcation arteries. The calcifications are heaviest proximally. IMPRESSION: 1. No evidence of fractures. 2. Lucent centered peripherally sclerotic intraosseous lesions in distal fibular shaft and distal tibial diametaphysis, both at about the same level. These could either represent bone infarcts or chondroid lesions. Bone metastases are considered unlikely in this location. Other etiologies would be possible but less likely. Consider MRI without and with contrast for further evaluation or a six-month  follow-up tibia / fibula series to ensure stability. 3. Small osteochondroma of the posterior distal fibular shaft versus chronic traumatic cortical deformity. 4. 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 5. Calcific arteriosclerosis. Electronically Signed   By: Almira Bar M.D.   On: 03/07/2021 00:45   DG Foot Complete Right  Result Date: 03/06/2021 CLINICAL DATA:  Right great toe pain after injury EXAM: RIGHT FOOT COMPLETE - 3+ VIEW COMPARISON:  None. FINDINGS: There is no evidence of fracture or dislocation. There is no evidence of arthropathy or other focal bone abnormality. Soft tissues are unremarkable. Vascular calcifications. Partially visualized distal tibial peripherally sclerotic and centrally lucent osseous lesion. Cortical irregularity of the distal fibula likely represents an old healed fracture. IMPRESSION: 1. No acute displaced fracture or dislocation. 2. Partially visualized distal tibial peripherally sclerotic and centrally lucent osseous lesion. Recommend dedicated radiograph of the right tibia fibula. 3. Cortical irregularity of the distal fibula likely represents an old healed fracture. This can further be evaluated on dedicated radiograph of the right  tibia fibula. Electronically Signed   By: Tish Frederickson M.D.   On: 03/06/2021 17:10    Procedures Procedures    Medications Ordered in ED Medications  ketorolac (TORADOL) 30 MG/ML injection 30 mg (30 mg Intramuscular Given 03/07/21 5427)    ED Course/ Medical Decision Making/ A&P                           Medical Decision Making  This is a 61 year old male presenting due to right foot pain.  I did review his chart specifically look at notes from previous orthopedic doctor, he follows with Dr. Liliane Channel.  I also checked his last BMP to check renal function which was done in November of last year.  Patient does not have a history of previous trauma, he has good range of motion and he is strong DP and PT.  Cap refill is less than 2, overall is reassuring.  He does not have a fever and is able to tolerate passive range which makes septic joint less likely.  Additionally compartments are soft I do not suspect compartment syndrome.  Gout is a consideration, but I think muscle strain is more likely given the mechanism of the injury and onset.  Radiographs ordered in triage were abnormal. Impression below.  I personally reviewed the radiograph and I agree with radiologist interpretation.  MPRESSION:  1. No evidence of fractures.  2. Lucent centered peripherally sclerotic intraosseous lesions in  distal fibular shaft and distal tibial diametaphysis, both at about  the same level. These could either represent bone infarcts or  chondroid lesions. Bone metastases are considered unlikely in this  location. Other etiologies would be possible but less likely.  Consider MRI without and with contrast for further evaluation or a  six-month follow-up tibia / fibula series to ensure stability.  3. Small osteochondroma of the posterior distal fibular shaft versus  chronic traumatic cortical deformity.  4. Moderately advanced tricompartmental degenerative arthrosis of  the knee with anterior and posterior  osteochondral loose bodies.  5. Calcific arteriosclerosis.   I did reach out to the on-call orthopedic physician Dale Sonora, he remotely reviewed the patient's x-rays and advise he needs to follow-up on a nonemergent basis with an orthopedic physician.  Does not think the patient needs an emergent MRI based on these findings.  Appreciate his consultation.  Given that he is vascularly  intact, will treat with pain (shot of Toradol, normal Cr function in 12/2019) and give walking boot to help with support.  I sent an in basket message to his orthopedic physician Dr. Roda ShuttersXu with results from the x-ray today.  I am unable to follow-up directly with his primary care doctor on epic but stressed importance for patient to follow up with her. Advised walk in appointment this afternoon or later this week for ER follow up.  Had an indepth conversation with the patient regarding his results. I stressed the importance of follow-up as this could be concerning for bone metastasis or primary cancer.  I stressed to the patient that we cannot confirm this based on an x-ray alone, I stressed the importance of follow-up in a timely manner.  Questions were answered. Teach back method employed: patient repeated results, plan and that he will reach out to Dr. Roda ShuttersXu and NP Fulp by phone today. and talked back was discharged in stable condition  I did consider ordering an MRI on an emergent basis, however given he is neurovascularly intact, this appears to be chronic rather than an acute condition, and orthopedic recommendations I do not feel at this time he needs an emergent MRI.  Patient does appear to have follow-up but his care is limited by social determinants of health including unstable housing, financial instability, job instability.  This is part of why consider doing an emergent MRI, however, do not feel there is an appropriate use of resources to do an MRI on an emergent basis at this time.        Final Clinical  Impression(s) / ED Diagnoses Final diagnoses:  Abnormal x-ray of bone  Right foot pain    Rx / DC Orders ED Discharge Orders     None         Theron AristaSage, Naia Ruff, PA-C 03/07/21 1333    Horton, Clabe SealKristie M, DO 03/07/21 1521

## 2021-03-07 NOTE — Discharge Instructions (Addendum)
Please call Dr. Gwendolyn Lima with orthopedics to schedule an appointment.  Please call them today to be seen as soon as possible. Call your primary care physician.  Tell her you were seen in the ED today for x-rays and had an abnormal x-ray result.  You can go do a walk-in appointment later this afternoon with her, I would advise doing that if possible. Information provided above for your cardiologist.  Call them to schedule follow-up as needed based on previous conversations. As we discussed, the x-ray was abnormal.  The results could be indicative of a cancer at worst, I am unable to know that for sure based on just the x-rays he will need additional work-up and imaging.  For this reason it is important you do not ignore this, please make sure you schedule follow-up. Take Tylenol and ibuprofen as needed for pain.  You can use the walking boot for support.  Your xray interpretation is as listed below for your reference.  IMPRESSION:  1. No evidence of fractures.  2. Lucent centered peripherally sclerotic intraosseous lesions in  distal fibular shaft and distal tibial diametaphysis, both at about  the same level. These could either represent bone infarcts or  chondroid lesions. Bone metastases are considered unlikely in this  location. Other etiologies would be possible but less likely.  Consider MRI without and with contrast for further evaluation or a  six-month follow-up tibia / fibula series to ensure stability.  3. Small osteochondroma of the posterior distal fibular shaft versus  chronic traumatic cortical deformity.  4. Moderately advanced tricompartmental degenerative arthrosis of  the knee with anterior and posterior osteochondral loose bodies.  5. Calcific arteriosclerosis.

## 2021-03-08 ENCOUNTER — Telehealth: Payer: Self-pay

## 2021-03-08 ENCOUNTER — Other Ambulatory Visit (HOSPITAL_COMMUNITY): Payer: Self-pay | Admitting: Interventional Radiology

## 2021-03-08 DIAGNOSIS — I639 Cerebral infarction, unspecified: Secondary | ICD-10-CM

## 2021-03-08 NOTE — Telephone Encounter (Signed)
Transition Care Management Follow-up Telephone Call Date of discharge and from where: 03/07/2021 from [facility] How have you been since you were released from the hospital? Pt stated that he is feeling better and did not have any questions or concerns.  Any questions or concerns? No  Items Reviewed: Did the pt receive and understand the discharge instructions provided? Yes  Medications obtained and verified? Yes  Other? No  Any new allergies since your discharge? No  Dietary orders reviewed? No Do you have support at home? Yes   Functional Questionnaire: (I = Independent and D = Dependent) ADLs: I  Bathing/Dressing- I  Meal Prep- I  Eating- I  Maintaining continence- I  Transferring/Ambulation- I  Managing Meds- I  Follow up appointments reviewed:  PCP Hospital f/u appt confirmed? No Specialist Hospital f/u appt confirmed? No Are transportation arrangements needed? No  If their condition worsens, is the pt aware to call PCP or go to the Emergency Dept.? Yes Was the patient provided with contact information for the PCP's office or ED? Yes Was to pt encouraged to call back with questions or concerns? Yes

## 2021-03-14 ENCOUNTER — Ambulatory Visit (HOSPITAL_COMMUNITY): Admission: RE | Admit: 2021-03-14 | Payer: Medicaid Other | Source: Ambulatory Visit

## 2021-03-14 ENCOUNTER — Ambulatory Visit: Payer: Medicaid Other | Admitting: Orthopaedic Surgery

## 2021-03-23 ENCOUNTER — Encounter: Payer: Self-pay | Admitting: Orthopaedic Surgery

## 2021-03-23 ENCOUNTER — Ambulatory Visit: Payer: Self-pay

## 2021-03-23 ENCOUNTER — Ambulatory Visit (HOSPITAL_COMMUNITY)
Admission: RE | Admit: 2021-03-23 | Discharge: 2021-03-23 | Disposition: A | Discharge status: Home / Self Care | Payer: Medicaid Other | Source: Ambulatory Visit | Attending: Interventional Radiology | Admitting: Interventional Radiology

## 2021-03-23 ENCOUNTER — Other Ambulatory Visit: Payer: Self-pay

## 2021-03-23 ENCOUNTER — Ambulatory Visit (INDEPENDENT_AMBULATORY_CARE_PROVIDER_SITE_OTHER): Payer: Medicaid Other | Admitting: Orthopaedic Surgery

## 2021-03-23 VITALS — Ht 66.5 in | Wt 182.4 lb

## 2021-03-23 DIAGNOSIS — I639 Cerebral infarction, unspecified: Secondary | ICD-10-CM | POA: Insufficient documentation

## 2021-03-23 DIAGNOSIS — M1711 Unilateral primary osteoarthritis, right knee: Secondary | ICD-10-CM | POA: Diagnosis not present

## 2021-03-23 NOTE — Progress Notes (Signed)
Carotid duplex has been completed.   Preliminary results in CV Proc.   Caretha Rumbaugh Marzetta Lanza 03/23/2021 9:30 AM

## 2021-03-23 NOTE — Progress Notes (Signed)
Office Visit Note   Patient: Kevin RiasDarryl Bonds           Date of Birth: [birth year]           MRN: [id number] Visit Date: 03/23/2021              Requested by: No referring provider defined for this encounter. PCP: Cain SaupeFulp, Cammie, MD (Inactive)   Assessment & Plan: Visit Diagnoses:  1. Primary osteoarthritis of right knee     Plan: Impression is end-stage right knee valgus DJD.  With failure of conservative management he has elected to proceed with a right total knee replacement in the near future pending medical and cardiac clearance from Dr. Jillyn HiddenFulp and Dr. Excell Seltzerooper.  He will need to stop the Brilinta 7 days in advance of surgery.  Risk benefits rehab recovery prognosis reviewed with the patient in detail.  He has a strong support system for postop care.  Questions encouraged and answered.  Total face to face encounter time was greater than 25 minutes and over half of this time was spent in counseling and/or coordination of care.  Follow-Up Instructions: No follow-ups on file.   Orders:  Orders Placed This Encounter  Procedures   XR KNEE 3 VIEW RIGHT   No orders of the defined types were placed in this encounter.     Procedures: No procedures performed   Clinical Data: No additional findings.   Subjective: Chief Complaint  Patient presents with   Right Knee - Pain    Nelma RothmanDaryl is a 61 year old gentleman that I saw couple years ago for advanced DJD.  Based on his options he decided to continue conservative management until recently.  He feels like the pain is no longer manageable.  He has undergone extensive conservative management in terms of cortisone injections, knee brace, cane, activity modifications.  He feels that these things are no longer effective.  He is now ready to undergo a knee replacement.   Review of Systems  Constitutional: Negative.   All other systems reviewed and are negative.   Objective: Vital Signs: Ht 5' 6.5" (1.689 m)    Wt 182 lb 6.4 oz (82.7 kg)     BMI 29.00 kg/m   Physical Exam Vitals and nursing note reviewed.  Constitutional:      Appearance: He is well-developed.  Pulmonary:     Effort: Pulmonary effort is normal.  Abdominal:     Palpations: Abdomen is soft.  Skin:    General: Skin is warm.  Neurological:     Mental Status: He is alert and oriented to person, place, and time.  Psychiatric:        Behavior: Behavior normal.        Thought Content: Thought content normal.        Judgment: Judgment normal.    Ortho Exam  Examination right knee shows mild valgus alignment.  5 to 10 degrees of flexion contracture.  Collaterals and cruciates are stable.  2+ crepitus with range of motion.  No joint line tenderness.  Pain with flexion of the knee past 70 degrees.  Range of motion limited secondary to pain.  Specialty Comments:  No specialty comments available.  Imaging: XR KNEE 3 VIEW RIGHT  Result Date: 03/23/2021 Advanced valgus tricompartmental DJD.  VAS US CAROTID  Result Date: 03/23/2021 Carotid Arterial Duplex Study Patient Name:  Kevin RiasDARRYL Akre  Date of Exam:   03/23/2021 Medical Rec #: [id number]      Accession #:    [id number] Date  of Birth: 1960/09/21      Patient Gender: M Patient Age:   61 years Exam Location:  [facility] Procedure:      VAS US CAROTID Referring Phys: Julieanne Cotton --------------------------------------------------------------------------------  Indications:      CVA and Right stent. Risk Factors:     Hypertension, hyperlipidemia, coronary artery disease, prior                   CVA. Comparison Study: prior 05/08/18 right stent <50% lt ica 1-39% Performing Technologist: [place] Ponder RVS  Examination Guidelines: A complete evaluation includes B-mode imaging, spectral Doppler, color Doppler, and power Doppler as needed of all accessible portions of each vessel. Bilateral testing is considered an integral part of a complete examination. Limited examinations for reoccurring indications may be  performed as noted.  Right Carotid Findings: +----------+--------+--------+--------+------------------+--------+             PSV cm/s EDV cm/s Stenosis Plaque Description Comments  +----------+--------+--------+--------+------------------+--------+  CCA Prox   108      19                heterogenous                 +----------+--------+--------+--------+------------------+--------+  CCA Distal 89       16                heterogenous                 +----------+--------+--------+--------+------------------+--------+  ICA Distal 51       18                                             +----------+--------+--------+--------+------------------+--------+  ECA        265      29                                             +----------+--------+--------+--------+------------------+--------+ +----------+--------+-------+--------+-------------------+             PSV cm/s EDV cms Describe Arm Pressure (mmHG)  +----------+--------+-------+--------+-------------------+  Subclavian 116                                            +----------+--------+-------+--------+-------------------+ +---------+--------+--+--------+--+---------+  Vertebral PSV cm/s 43 EDV cm/s 14 Antegrade  +---------+--------+--+--------+--+---------+  Right Stent(s): +---------------+---+--+-------------+++  Prox to Stent   101 26                  +---------------+---+--+-------------+++  Proximal Stent  91  21 <50% stenosis    +---------------+---+--+-------------+++  Mid Stent       55  18                  +---------------+---+--+-------------+++  Distal Stent    79  24                  +---------------+---+--+-------------+++  Distal to Stent 72  23                  +---------------+---+--+-------------+++   Left Carotid Findings: +----------+--------+--------+--------+------------------+--------+             PSV  cm/s EDV cm/s Stenosis Plaque Description Comments  +----------+--------+--------+--------+------------------+--------+  CCA Prox   111      23                 heterogenous                 +----------+--------+--------+--------+------------------+--------+  CCA Distal 63       18                heterogenous                 +----------+--------+--------+--------+------------------+--------+  ICA Prox   102      36       1-39%    heterogenous                 +----------+--------+--------+--------+------------------+--------+  ICA Distal 100      36                                             +----------+--------+--------+--------+------------------+--------+  ECA        152      13                                             +----------+--------+--------+--------+------------------+--------+ +----------+--------+--------+--------+-------------------+             PSV cm/s EDV cm/s Describe Arm Pressure (mmHG)  +----------+--------+--------+--------+-------------------+  Subclavian 181                                             +----------+--------+--------+--------+-------------------+ +---------+--------+--+--------+--+---------+  Vertebral PSV cm/s 49 EDV cm/s 14 Antegrade  +---------+--------+--+--------+--+---------+   Summary: Right Carotid: Right ICA stent <50% stenosis. Left Carotid: Velocities in the left ICA are consistent with a 1-39% stenosis. Vertebrals: Bilateral vertebral arteries demonstrate antegrade flow. *See table(s) above for measurements and observations.  Electronically signed by Delia HeadyPramod Sethi MD on 03/23/2021 at 10:46:47 AM.    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 10/08/2017   Cocaine use 10/08/2017   Intracranial atherosclerosis 10/08/2017   Bradycardia 10/08/2017   Alcohol  use 10/08/2017   Obesity (BMI 30.0-34.9) 10/08/2017   Acute blood loss anemia 10/08/2017   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 Mild nonobstructive plaque in the left main, LAD, and LCx. LVEF 50%. TX Rx.    Chronic back pain  Chronic knee pain    HTN (hypertension)    Hyperlipidemia    NSTEMI (non-ST elevated myocardial infarction) (HCC)    Stroke (HCC)    Tobacco abuse     Family History  Problem Relation Age of Onset   Diabetes Brother     Past Surgical History:  Procedure Laterality Date   IR ANGIO INTRA EXTRACRAN SEL COM CAROTID INNOMINATE UNI L MOD SED  10/05/2017   IR ANGIO VERTEBRAL SEL SUBCLAVIAN INNOMINATE UNI R MOD SED  10/05/2017   IR CT HEAD LTD  10/05/2017   IR INTRAVSC STENT CERV CAROTID W/O EMB-PROT MOD SED INC ANGIO  10/05/2017   IR PERCUTANEOUS ART THROMBECTOMY/INFUSION INTRACRANIAL INC DIAG ANGIO  10/05/2017   LEFT HEART CATH AND CORONARY ANGIOGRAPHY N/A 07/25/2016   Procedure: Left Heart Cath and Coronary Angiography;  Surgeon: Tonny Bollman, MD;  Location: [facility] INVASIVE CV LAB;  Service: Cardiovascular;  Laterality: N/A;   RADIOLOGY WITH ANESTHESIA N/A 10/05/2017   Procedure: IR WITH ANESTHESIA CODE STROKE;  Surgeon: Julieanne Cotton, MD;  Location: [facility] OR;  Service: Radiology;  Laterality: N/A;   Social History   Occupational History   Not on file  Tobacco Use   Smoking status: Every Day    Packs/day: 0.50    Types: Cigarettes   Smokeless tobacco: Never   Tobacco comments:    smoke 6 per week   Vaping Use   Vaping Use: Never used  Substance and Sexual Activity   Alcohol use: Yes    Comment: a beer a day   Drug use: No   Sexual activity: Yes

## 2021-03-27 ENCOUNTER — Telehealth (HOSPITAL_COMMUNITY): Payer: Self-pay

## 2021-03-27 NOTE — Telephone Encounter (Signed)
Pt agreed to f/u in 6 months with us carotid. AW 

## 2021-04-03 ENCOUNTER — Ambulatory Visit: Payer: Medicaid Other | Admitting: Family Medicine

## 2021-04-20 ENCOUNTER — Ambulatory Visit: Payer: Medicaid Other | Admitting: Physician Assistant

## 2021-04-27 ENCOUNTER — Telehealth: Payer: Self-pay | Admitting: *Deleted

## 2021-04-27 NOTE — Telephone Encounter (Signed)
S/w pt is aware of new time and date.  ?

## 2021-04-28 ENCOUNTER — Ambulatory Visit: Payer: Medicaid Other | Admitting: Physician Assistant

## 2021-05-11 ENCOUNTER — Other Ambulatory Visit: Payer: Self-pay

## 2021-05-11 ENCOUNTER — Ambulatory Visit: Payer: Medicaid Other | Attending: Family Medicine | Admitting: Physician Assistant

## 2021-05-11 ENCOUNTER — Encounter: Payer: Self-pay | Admitting: Physician Assistant

## 2021-05-11 VITALS — BP 146/90 | HR 60 | Resp 16 | Ht 66.5 in | Wt 177.0 lb

## 2021-05-11 DIAGNOSIS — H9201 Otalgia, right ear: Secondary | ICD-10-CM | POA: Diagnosis not present

## 2021-05-11 DIAGNOSIS — I2510 Atherosclerotic heart disease of native coronary artery without angina pectoris: Secondary | ICD-10-CM | POA: Diagnosis not present

## 2021-05-11 DIAGNOSIS — I10 Essential (primary) hypertension: Secondary | ICD-10-CM | POA: Diagnosis not present

## 2021-05-11 DIAGNOSIS — Z8673 Personal history of transient ischemic attack (TIA), and cerebral infarction without residual deficits: Secondary | ICD-10-CM | POA: Diagnosis not present

## 2021-05-11 DIAGNOSIS — H6121 Impacted cerumen, right ear: Secondary | ICD-10-CM | POA: Diagnosis not present

## 2021-05-11 MED ORDER — AMLODIPINE BESYLATE 5 MG PO TABS: 5.0000 mg | ORAL_TABLET | Freq: Every day | ORAL | 3 refills | Status: DC

## 2021-05-11 MED ORDER — TICAGRELOR 90 MG PO TABS: 90.0000 mg | ORAL_TABLET | Freq: Two times a day (BID) | ORAL | 11 refills | Status: DC

## 2021-05-11 MED ORDER — ATORVASTATIN CALCIUM 80 MG PO TABS: 80.0000 mg | ORAL_TABLET | Freq: Every day | ORAL | 4 refills | Status: DC

## 2021-05-11 MED ORDER — AMOXICILLIN 500 MG PO TABS: 500.0000 mg | ORAL_TABLET | Freq: Three times a day (TID) | ORAL | 0 refills | Status: DC

## 2021-05-11 NOTE — Progress Notes (Signed)
Patient ID: Kevin Barton, male   DOB: 02/21/1961, 61 y.o.   MRN: [id number] ? ? ?Kevin Barton, is a 61 y.o. male ? ?HKV:[id number] ? ?FIE:[id number] ? ?DOB - Oct 08, 1960 ? ?Chief Complaint  ?Patient presents with  ? Hypertension  ?    ? ?Subjective:  ? ?Isreal Barton is a 61 y.o. male here today for R ear pain X 2 days.  No fever, no other s/sx of illness.   ? ?He is awaiting knee surgery with Dr Roda Shutters.  He does not have a date scheduled yet.  He has not been taking any of his medications bc he thought Dr Roda Shutters told him not to until after surgery.   ? ?No problems updated. ? ?ALLERGIES: ?No Known Allergies ? ?PAST MEDICAL HISTORY: ?Past Medical History:  ?Diagnosis Date  ? Arthritis   ? CAD (coronary artery disease)   ? cath 06/2016 occlusion of RCA with collateralization from the left coronary artery. Mild nonobstructive plaque in the left main, LAD, and LCx. LVEF 50%. TX Rx.   ? Chronic back pain   ? Chronic knee pain   ? HTN (hypertension)   ? Hyperlipidemia   ? NSTEMI (non-ST elevated myocardial infarction) (HCC)   ? Stroke [facility])   ? Tobacco abuse   ? ? ?MEDICATIONS AT HOME: ?Prior to Admission medications   ?Medication Sig Start Date End Date Taking? Authorizing Provider  ?amLODipine (NORVASC) 5 MG tablet Take 1 tablet (5 mg total) by mouth daily. 05/11/21   Anders Simmonds, PA-C  ?amoxicillin (AMOXIL) 500 MG tablet Take 1 tablet (500 mg total) by mouth 3 (three) times daily. 05/11/21   Anders Simmonds, PA-C  ?aspirin EC 81 MG tablet Take 1 tablet (81 mg total) by mouth daily. ?Patient not taking: Reported on 05/11/2021 09/10/19   Marcine Matar, MD  ?atorvastatin (LIPITOR) 80 MG tablet Take 1 tablet (80 mg total) by mouth daily at 6 PM. 05/11/21   Annaleise Burger, Marzella Schlein, PA-C  ?methylPREDNISolone (MEDROL DOSEPAK) 4 MG TBPK tablet Day 1: 8mg  before breakfast, 4 mg after lunch, 4 mg after supper, and 8 mg at bedtime Day 2: 4 mg before breakfast, 4 mg after lunch, 4 mg  after supper, and 8 mg  at bedtime Day 3:  4 mg  before  breakfast, 4 mg  after lunch, 4 mg after supper, and 4 mg  at bedtime Day 4: 4 mg  before breakfast, 4 mg  after lunch, and 4 mg at bedtime Day 5: 4 mg  before breakfast and 4 mg at bedtime Day 6: 4 mg  before breakfast ?Patient not taking: Reported on 05/11/2021 01/10/21   01/12/21, DO  ?ticagrelor (BRILINTA) 90 MG TABS tablet Take 1 tablet (90 mg total) by mouth 2 (two) times daily. 05/11/21   05/13/21, PA-C  ?varenicline (CHANTIX STARTING MONTH PAK) 0.5 MG X 11 & 1 MG X 42 tablet Take one 0.5 mg tablet by mouth once daily for 3 days, then increase to one 0.5 mg tablet twice daily for 4 days, then increase to one 1 mg tablet twice daily. ?Patient not taking: Reported on 05/11/2021 10/12/19   10/14/19, MD  ? ? ?ROS: ?Neg HEENT ?Neg resp ?Neg cardiac ?Neg GI ?Neg GU ?Neg MS ?Neg psych ?Neg neuro ? ?Objective:  ? ?Vitals:  ? 05/11/21 0925  ?BP: (!) 146/90  ?Pulse: 60  ?Resp: 16  ?SpO2: 100%  ?Weight: 177 lb (80.3 kg)  ?Height: 5' 6.5" (1.689 m)  ? ?  Exam ?General appearance : Awake, alert, not in any distress. Speech Clear. Not toxic looking ?HEENT: Atraumatic and Normocephalic, LTM and canal WNL.  R TM occluded by hard cerumen.  Ear scoop successfully removed about a raisin-sized piece of wax.  Still more cerumen up against the TM.  Only a tiny bit if TM is visible and it is red.   ?Neck: Supple, no JVD. No cervical lymphadenopathy.  ?Chest: Good air entry bilaterally, CTAB.  No rales/rhonchi/wheezing ?CVS: S1 S2 regular, no murmurs.  ?Extremities: B/L Lower Ext shows no edema, both legs are warm to touch ?Neurology: Awake alert, and oriented X 3, CN II-XII intact, Non focal ?Skin: No Rash ? ?Data Review ?Lab Results  ?Component Value Date  ? HGBA1C 5.5 10/06/2017  ? ? ?Assessment & Plan  ? ?1. Impacted cerumen of right ear ?Believe his pain is due to deep cerumen impaction but will cover for infection- Ambulatory referral to ENT ? ?2. Right ear pain ?See #1 ?- amoxicillin (AMOXIL) 500 MG tablet;  Take 1 tablet (500 mg total) by mouth 3 (three) times daily.  Dispense: 30 tablet; Refill: 0 ?- Ambulatory referral to ENT ? ? ? ?RESUME MEDS-I reviewed Dr Warren Danes note and he DID nOT SAY to stop all meds.  He told him to stop Brillinta 7 days prior to surgery but no surgery date scheduled yet.  Explained to patient and he verbalized understanding and said he will resume meds today.   ?3. Essential (primary) hypertension ?- amLODipine (NORVASC) 5 MG tablet; Take 1 tablet (5 mg total) by mouth daily.  Dispense: 90 tablet; Refill: 3 ? ?4. Coronary artery disease involving native coronary artery of native heart without angina pectoris ?- atorvastatin (LIPITOR) 80 MG tablet; Take 1 tablet (80 mg total) by mouth daily at 6 PM.  Dispense: 30 tablet; Refill: 4 ? ?5. History of stroke ?- ticagrelor (BRILINTA) 90 MG TABS tablet; Take 1 tablet (90 mg total) by mouth 2 (two) times daily.  Dispense: 60 tablet; Refill: 11 ? ?Labs from 12/2020 reviewed.   ? ?Patient have been counseled extensively about nutrition and exercise. Other issues discussed during this visit include: low cholesterol diet, weight control and daily exercise, foot care, annual eye examinations at Ophthalmology, importance of adherence with medications and regular follow-up. We also discussed long term complications of uncontrolled diabetes and hypertension.  ? ?Return in about 2 months (around 07/11/2021) for assign PCP-. ? ?The patient was given clear instructions to go to ER or return to medical center if symptoms don't improve, worsen or new problems develop. The patient verbalized understanding. The patient was told to call to get lab results if they haven't heard anything in the next week.  ? ? ? ? ?Georgian Co, PA-C ?[facility] ?[place], [place] ?[phone]   ?05/11/2021, 10:07 AM  ?

## 2021-05-15 ENCOUNTER — Telehealth: Payer: Self-pay

## 2021-05-15 NOTE — Telephone Encounter (Signed)
? ?  Pre-operative Risk Assessment  ?  ?Patient Name: Kevin Barton  ?DOB: 1960-08-05 ?MRN: [id number]  ? ?  ? ?Request for Surgical Clearance   ? ?Procedure:   Right Total Knee Arthroplasty ? ?Date of Surgery:  Clearance TBD                              ?   ?Surgeon:  Dr.Xu ?Surgeon's Group or Practice Name:  [facility] ?Phone number:  [phone] ?Fax number:  [phone] ?  ?Type of Clearance Requested:   ?- Medical  ?- Pharmacy:  Hold Ticagrelor (Brilinta) x7days ASA-not indicated ?  ?Type of Anesthesia:  Spinal ?  ?Additional requests/questions:     ? ?Signed, ?Montina Dorrance   ?05/15/2021, 4:52 PM  ? ?

## 2021-05-15 NOTE — Telephone Encounter (Signed)
? ?  Name: Khyrie Beverley  ?DOB: 07/28/1960  ?MRN: [id number] ? ?Primary Cardiologist: Tonny Bollman, MD ? ?Chart reviewed as part of pre-operative protocol coverage. Because of Zevin Bessinger's past medical history and time since last visit, he will require a follow-up visit in order to better assess preoperative cardiovascular risk. ? ?Right MCA stroke in August 2019 which was treated with endovascular intervention of the right MCA as well as stenting of an occluded right ICA. Unclear if he is taking brilinta for this indication.  ? ?Pre-op covering staff: ?- Please schedule appointment and call patient to inform them. If patient already had an upcoming appointment within acceptable timeframe, please add "pre-op clearance" to the appointment notes so provider is aware. ?- Please contact requesting surgeon's office via preferred method (i.e, phone, fax) to inform them of need for appointment prior to surgery. ? ?If applicable, this message will also be routed to pharmacy pool and/or primary cardiologist for input on holding anticoagulant/antiplatelet agent as requested below so that this information is available to the clearing provider at time of patient's appointment.  ? ?Marcelino Duster, PA  ?05/15/2021, 5:36 PM  ? ?

## 2021-05-16 NOTE — Telephone Encounter (Signed)
Pt has appt with Tereso Newcomer, [facility] 05/19/21 for pre op clearance. I will forward notes to [facility] for upcoming appt. Will send FYI to requesting office the pt has an appt 05/19/21 ?

## 2021-05-16 NOTE — Telephone Encounter (Signed)
Thank you :)

## 2021-05-17 NOTE — Telephone Encounter (Signed)
Not on anticoagulation 

## 2021-05-18 DIAGNOSIS — Z01810 Encounter for preprocedural cardiovascular examination: Secondary | ICD-10-CM | POA: Insufficient documentation

## 2021-05-18 NOTE — Progress Notes (Addendum)
?Cardiology Office Note:   ? ?Date:  05/19/2021  ? ?IDPadraic Barton, DOB 11/01/1960, MRN [id number] ? ?PCP:  Antony Blackbird, MD  ?[facility] Providers ?Cardiologist:  Sherren Mocha, MD ?Cardiology APP:  Liliane Shi, PA-C    ?Referring MD: Antony Blackbird, MD  ? ?Chief Complaint:  Surgical clearance ?  ? ?Patient Profile: ?Coronary artery disease  ?Known CTO of the RCA w/ L-R collats >> med Rx ?Hx of CVA (R MCA) in 8/19 >> tx w/ tPA, endovascular intervention/ICA stent ?Polysubstance abuse ?Hypertension  ?Hyperlipidemia  ?Tobacco abuse  ?  ?Prior CV studies: ?Carotid US 03/23/21 ?RICA < 50 ?LICA 4-09 ? ?Carotid US 05/08/2018 ?RICA stent patent; L 1-39 ?  ?Carotid US 10/07/17 ?Final Interpretation: ?Right Carotid: Right ICA stent <50% stenosis. ?Left Carotid: Velocities in the left ICA are consistent with a 1-39% stenosis. ?Vertebrals: Bilateral vertebral arteries demonstrate antegrade flow. ?  ?Echocardiogram 10/06/2017 ?Moderate LVH, EF 65-70, normal wall motion, grade 2 diastolic dysfunction, mild MAC, trivial MR, normal RV SF ?  ?Cardiac catheterization 07/25/2016 ?LAD mild disease ?RI mild disease ?LCx ostial 30 ?RCA mid 100 with L-R collaterals (first septal perforator) ?EF 45-50 ?1. Severe single vessel CAD with total occlusion of the RCA, collateralized from the left coronary artery ?2. Mild nonobstructive plaque in the left main, LAD, and LCx ?3. Mild segmental LV dysfunction, LVEF 50% ?Recommend: medical therapy ? ? ? ?History of Present Illness:   ?Kevin Barton is a 61 y.o. male with the above problem list.  He was last seen in 2021 for surgical clearance.  He returns for surgical clearance.  He needs R TKR with Dr. Erlinda Hong under spinal anesthesia.  Of note, the patient needs his Brilinta held, which has been managed by neuro/interventional radiology in the past.   ? ?He is here alone today.  He notes that he does have chest discomfort from time to time if he exerts himself after smoking.  If he does not smoke,  he does not have chest pain.  He has not had significant shortness of breath.  He has not had orthopnea, leg edema, syncope.  He is off of all of his medications.  Primary care has recently renewed the mall for him.  He has not picked them up.  I have encouraged him to pick up all of his medications.  He has not followed up with neurology in years.  He still has Brilinta on his medication list.  Brilinta was prescribed by neurology after his neurovascular intervention. ?   ?Past Medical History:  ?Diagnosis Date  ? Arthritis   ? CAD (coronary artery disease)   ? cath 06/2016 occlusion of RCA with collateralization from the left coronary artery. Mild nonobstructive plaque in the left main, LAD, and LCx. LVEF 50%. TX Rx.   ? Chronic back pain   ? Chronic knee pain   ? HTN (hypertension)   ? Hyperlipidemia   ? NSTEMI (non-ST elevated myocardial infarction) ([place])   ? Stroke [facility])   ? Tobacco abuse   ? ?Current Medications: ?Current Meds  ?Medication Sig  ? amLODipine (NORVASC) 5 MG tablet Take 1 tablet (5 mg total) by mouth daily.  ? amoxicillin (AMOXIL) 500 MG tablet Take 1 tablet (500 mg total) by mouth 3 (three) times daily.  ? aspirin EC 81 MG tablet Take 1 tablet (81 mg total) by mouth daily.  ? atorvastatin (LIPITOR) 80 MG tablet Take 1 tablet (80 mg total) by mouth daily at 6 PM.  ?  methylPREDNISolone (MEDROL DOSEPAK) 4 MG TBPK tablet Day 1: 29m before breakfast, 4 mg after lunch, 4 mg after supper, and 8 mg at bedtime Day 2: 4 mg before breakfast, 4 mg after lunch, 4 mg  after supper, and 8 mg  at bedtime Day 3:  4 mg  before breakfast, 4 mg  after lunch, 4 mg after supper, and 4 mg  at bedtime Day 4: 4 mg  before breakfast, 4 mg  after lunch, and 4 mg at bedtime Day 5: 4 mg  before breakfast and 4 mg at bedtime Day 6: 4 mg  before breakfast  ? ticagrelor (BRILINTA) 90 MG TABS tablet Take 1 tablet (90 mg total) by mouth 2 (two) times daily.  ?  ?Allergies:   Patient has no known allergies.  ? ?Social History   ? ?Tobacco Use  ? Smoking status: Every Day  ?  Packs/day: 0.50  ?  Types: Cigarettes  ? Smokeless tobacco: Never  ? Tobacco comments:  ?  smoke 6 per week  ?Vaping Use  ? Vaping Use: Never used  ?Substance Use Topics  ? Alcohol use: Yes  ?  Comment: a beer a day  ? Drug use: No  ?  ?Family Hx: ?The patient's family history includes Diabetes in his brother. ? ?Review of Systems  ?Gastrointestinal:  Negative for hematochezia.  ?Genitourinary:  Negative for hematuria.   ? ?EKGs/Labs/Other Test Reviewed:   ? ?EKG:  EKG is   ordered today.  The ekg ordered today demonstrates sinus bradycardia, HR 52, normal axis, no ST-T wave changes, QTc 383 ? ?Recent Labs: ?01/10/2021: BUN 10; Creatinine, Ser 1.09; Hemoglobin 15.5; Platelets 265; Potassium 4.1; Sodium 139  ? ?Recent Lipid Panel ?No results for input(s): CHOL, TRIG, HDL, VLDL, LDLCALC, LDLDIRECT in the last 8760 hours.  ? ?Risk Assessment/Calculations:   ?  ?    ?Physical Exam:   ? ?VS:  BP 130/78 (BP Location: Right Arm, Patient Position: Sitting, Cuff Size: Normal)   Pulse (!) 52   Ht _0  (1.626 m)   Wt 170 lb 9.6 oz (77.4 kg)   SpO2 97%   BMI 29.28 kg/m?    ? ?Wt Readings from Last 3 Encounters:  ?05/19/21 170 lb 9.6 oz (77.4 kg)  ?05/11/21 177 lb (80.3 kg)  ?03/23/21 182 lb 6.4 oz (82.7 kg)  ?  ?Constitutional:   ?   Appearance: Healthy appearance. Not in distress.  ?Neck:  ?   Vascular: No JVR. JVD normal.  ?Pulmonary:  ?   Effort: Pulmonary effort is normal.  ?   Breath sounds: No wheezing. No rales.  ?Cardiovascular:  ?   Normal rate. Regular rhythm. Normal S1. Normal S2.   ?   Murmurs: There is no murmur.  ?Edema: ?   Peripheral edema absent.  ?Abdominal:  ?   Palpations: Abdomen is soft.  ?Skin: ?   General: Skin is warm and dry.  ?Neurological:  ?   General: No focal deficit present.  ?   Mental Status: Alert and oriented to person, place and time.  ?   Cranial Nerves: Cranial nerves are intact.  ?  ?    ?ASSESSMENT & PLAN:   ?Preoperative  cardiovascular examination ?Mr. Gajda's perioperative risk of a major cardiac event is 6.6% according to the Revised Cardiac Risk Index (RCRI).  Therefore, he is at high risk for perioperative complications.   His functional capacity is fair at 4.31 METs according to the Duke Activity Status Index (  DASI).  However, he is having chest discomfort at times with exertion. ?Recommendations: ?According to ACC/AHA guidelines, the patient will require stress testing for further risk stratification. ?  ?Antiplatelet and/or Anticoagulation Recommendations: ?Aspirin can be held for 7 days prior to his surgery.  Please resume Aspirin post operatively when it is felt to be safe from a bleeding standpoint.  ?Management of ticagrelor (Brilinta) as per neurology. ?  ?Addendum: Nuclear stress test 06/13/2021 was low risk and negative for ischemia.  Therefore, the patient may proceed with his planned surgery at acceptable risk. ? ?Coronary artery disease involving native coronary artery of native heart without angina pectoris ?Known chronic occlusion of the RCA with left-right collaterals by cardiac catheterization in 2019.  He is having some chest discomfort at times with exertion.  He relates it to smoking.  He has been on and off his medications for years.  Prior to clearing him to proceed with surgery, I have recommended proceeding with stress testing. ?Arrange Lexiscan Myoview ?Continue aspirin 81 mg daily, atorvastatin 80 mg daily. ? ?Essential (primary) hypertension ?Blood pressure is controlled today.  However, he is off of all of his medications.  I have encouraged him to refill his amlodipine and restart his dose of 5 mg daily. ? ?Hyperlipidemia LDL goal <70 ?He should resume Lipitor 80 mg daily.  At follow-up, if he has been adherent to medical therapy, we will arrange follow-up fasting lipids. ? ?History of CVA (cerebrovascular accident) without residual deficits ?History of right MCA infarct in 2019.  He was treated with  tPA and endovascular intervention with ICA stent.  Recent carotid US with patent right ICA.  He has not seen neurology since 2020.  He still has Brilinta on his medication list.  Recommendations regarding Brilinta arou

## 2021-05-19 ENCOUNTER — Ambulatory Visit (INDEPENDENT_AMBULATORY_CARE_PROVIDER_SITE_OTHER): Payer: Medicaid Other | Admitting: Physician Assistant

## 2021-05-19 ENCOUNTER — Other Ambulatory Visit: Payer: Self-pay

## 2021-05-19 ENCOUNTER — Encounter: Payer: Self-pay | Admitting: Physician Assistant

## 2021-05-19 VITALS — BP 130/78 | HR 52 | Ht 64.0 in | Wt 170.6 lb

## 2021-05-19 DIAGNOSIS — I10 Essential (primary) hypertension: Secondary | ICD-10-CM

## 2021-05-19 DIAGNOSIS — E785 Hyperlipidemia, unspecified: Secondary | ICD-10-CM | POA: Diagnosis not present

## 2021-05-19 DIAGNOSIS — I2510 Atherosclerotic heart disease of native coronary artery without angina pectoris: Secondary | ICD-10-CM

## 2021-05-19 DIAGNOSIS — Z01810 Encounter for preprocedural cardiovascular examination: Secondary | ICD-10-CM

## 2021-05-19 DIAGNOSIS — Z720 Tobacco use: Secondary | ICD-10-CM

## 2021-05-19 DIAGNOSIS — R072 Precordial pain: Secondary | ICD-10-CM | POA: Diagnosis not present

## 2021-05-19 DIAGNOSIS — Z8673 Personal history of transient ischemic attack (TIA), and cerebral infarction without residual deficits: Secondary | ICD-10-CM

## 2021-05-19 NOTE — Assessment & Plan Note (Signed)
He knows that he needs to quit. ?

## 2021-05-19 NOTE — Assessment & Plan Note (Signed)
He should resume Lipitor 80 mg daily.  At follow-up, if he has been adherent to medical therapy, we will arrange follow-up fasting lipids. ?

## 2021-05-19 NOTE — Assessment & Plan Note (Signed)
Blood pressure is controlled today.  However, he is off of all of his medications.  I have encouraged him to refill his amlodipine and restart his dose of 5 mg daily. ?

## 2021-05-19 NOTE — Assessment & Plan Note (Signed)
History of right MCA infarct in 2019.  He was treated with tPA and endovascular intervention with ICA stent.  Recent carotid US with patent right ICA.  He has not seen neurology since 2020.  He still has Brilinta on his medication list.  Recommendations regarding Brilinta around his surgery need to come from neurology.  Refer back to neurology. ?

## 2021-05-19 NOTE — Assessment & Plan Note (Addendum)
Kevin Barton perioperative risk of a major cardiac event is 6.6% according to the Revised Cardiac Risk Index (RCRI).  Therefore, he is at high risk for perioperative complications.   His functional capacity is fair at 4.31 METs according to the Duke Activity Status Index (DASI).  However, he is having chest discomfort at times with exertion. ?Recommendations: ?According to ACC/AHA guidelines, the patient will require stress testing for further risk stratification. ?  ?Antiplatelet and/or Anticoagulation Recommendations: ?Aspirin can be held for 7 days prior to his surgery.  Please resume Aspirin post operatively when it is felt to be safe from a bleeding standpoint.  ?Management of ticagrelor (Brilinta) as per neurology. ?  ?

## 2021-05-19 NOTE — Assessment & Plan Note (Signed)
Known chronic occlusion of the RCA with left-right collaterals by cardiac catheterization in 2019.  He is having some chest discomfort at times with exertion.  He relates it to smoking.  He has been on and off his medications for years.  Prior to clearing him to proceed with surgery, I have recommended proceeding with stress testing. ?? Arrange Lexiscan Myoview ?? Continue aspirin 81 mg daily, atorvastatin 80 mg daily. ?

## 2021-05-19 NOTE — Patient Instructions (Signed)
,  Medication Instructions:  ?Your physician recommends that you continue on your current medications as directed. Please refer to the Current Medication list given to you today. ? ?*If you need a refill on your cardiac medications before your next appointment, please call your pharmacy* ? ? ?Lab Work: ?None ordered ? ?If you have labs (blood work) drawn today and your tests are completely normal, you will receive your results only by: ?MyChart Message (if you have MyChart) OR ?A paper copy in the mail ?If you have any lab test that is abnormal or we need to change your treatment, we will call you to review the results. ? ? ?Testing/Procedures: ?Your physician has requested that you have a lexiscan myoview. For further information please visit [email or web address] Please follow instruction sheet, BELOW:  ? ? ? ?You are scheduled for a Myocardial Perfusion Imaging Study ?Please arrive 15 minutes prior to your appointment time for registration and insurance purposes. ? ?The test will take approximately 3 to 4 hours to complete; you may bring reading material.  If someone comes with you to your appointment, they will need to remain in the main lobby due to limited space in the testing area. **If you are pregnant or breastfeeding, please notify the nuclear lab prior to your appointment** ? ?How to prepare for your Myocardial Perfusion Test: ?Do not eat or drink 3 hours prior to your test, except you may have water. ?Do not consume products containing caffeine (regular or decaffeinated) 12 hours prior to your test. (ex: coffee, chocolate, sodas, tea). ?Do bring a list of your current medications with you.  If not listed below, you may take your medications as normal. ?Do wear comfortable clothes (no dresses or overalls) and walking shoes, tennis shoes preferred (No heels or open toe shoes are allowed). ?Do NOT wear cologne, perfume, aftershave, or lotions (deodorant is allowed). ?If these instructions are not followed,  your test will have to be rescheduled. ? ?. ? ? ? ?Follow-Up: ?At [facility], you and your health needs are our priority.  As part of our continuing mission to provide you with exceptional heart care, we have created designated Provider Care Teams.  These Care Teams include your primary Cardiologist (physician) and Advanced Practice Providers (APPs -  Physician Assistants and Nurse Practitioners) who all work together to provide you with the care you need, when you need it. ? ?We recommend signing up for the patient portal called "MyChart".  Sign up information is provided on this After Visit Summary.  MyChart is used to connect with patients for Virtual Visits (Telemedicine).  Patients are able to view lab/test results, encounter notes, upcoming appointments, etc.  Non-urgent messages can be sent to your provider as well.   ?To learn more about what you can do with MyChart, go to [email or web address].   ? ?Your next appointment:   ?6 month(s) ? ?The format for your next appointment:   ?In Person ? ?Provider:   ?Tonny Bollman, MD   ? ? ?Other Instructions ? ? ?

## 2021-05-29 ENCOUNTER — Telehealth (HOSPITAL_COMMUNITY): Payer: Self-pay | Admitting: *Deleted

## 2021-05-29 NOTE — Telephone Encounter (Signed)
Patient's sister given detailed instructions per Myocardial Perfusion Study Information Sheet for the test on 06/05/2021 at 10:00. Patient notified to arrive 15 minutes early and that it is imperative to arrive on time for appointment to keep from having the test rescheduled. If you need to cancel or reschedule your appointment, please call the office within 24 hours of your appointment. . Patient verbalized understanding.Nelson Chimes S ? ? ? ? ?

## 2021-06-05 ENCOUNTER — Ambulatory Visit (HOSPITAL_COMMUNITY): Payer: Medicaid Other | Attending: Cardiovascular Disease

## 2021-06-13 ENCOUNTER — Ambulatory Visit (HOSPITAL_COMMUNITY): Payer: Medicaid Other | Attending: Cardiology

## 2021-06-13 ENCOUNTER — Telehealth: Payer: Self-pay | Admitting: Orthopaedic Surgery

## 2021-06-13 DIAGNOSIS — R072 Precordial pain: Secondary | ICD-10-CM | POA: Insufficient documentation

## 2021-06-13 LAB — MYOCARDIAL PERFUSION IMAGING
LV dias vol: 110 mL (ref 62–150)
LV sys vol: 47 mL
Nuc Stress EF: 57 %
Peak HR: 85 {beats}/min
Rest HR: 55 {beats}/min
Rest Nuclear Isotope Dose: 10.2 mCi
SDS: 0
SRS: 0
SSS: 0
ST Depression (mm): 0 mm
Stress Nuclear Isotope Dose: 31.9 mCi
TID: 1.13

## 2021-06-13 MED ORDER — TECHNETIUM TC 99M TETROFOSMIN IV KIT
31.9000 | PACK | Freq: Once | INTRAVENOUS | Status: AC | PRN
  Administered 2021-06-13: 31.9 via INTRAVENOUS
  Filled 2021-06-13: qty 32

## 2021-06-13 MED ORDER — TECHNETIUM TC 99M TETROFOSMIN IV KIT
10.2000 | PACK | Freq: Once | INTRAVENOUS | Status: AC | PRN
  Administered 2021-06-13: 10.2 via INTRAVENOUS
  Filled 2021-06-13: qty 11

## 2021-06-13 MED ORDER — REGADENOSON 0.4 MG/5ML IV SOLN
0.4000 mg | Freq: Once | INTRAVENOUS | Status: AC
  Administered 2021-06-13: 0.4 mg via INTRAVENOUS

## 2021-06-13 NOTE — Telephone Encounter (Signed)
Called patient to discuss scheduling right total knee arthroplasty.  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  He says he is thinking about October of this year. Patient was given my name and direct number and may call closer to October.  ?

## 2021-06-14 NOTE — Telephone Encounter (Signed)
Notes faxed to surgeon. ?Tereso Newcomer, PA-C    ?06/14/2021 5:02 PM   ?

## 2021-08-01 ENCOUNTER — Ambulatory Visit: Payer: Medicaid Other | Admitting: Family Medicine

## 2021-10-05 ENCOUNTER — Other Ambulatory Visit (HOSPITAL_COMMUNITY): Payer: Self-pay | Admitting: Interventional Radiology

## 2021-10-05 DIAGNOSIS — I639 Cerebral infarction, unspecified: Secondary | ICD-10-CM

## 2021-10-09 ENCOUNTER — Ambulatory Visit (HOSPITAL_COMMUNITY)
Admission: RE | Admit: 2021-10-09 | Discharge: 2021-10-09 | Disposition: A | Discharge status: Home / Self Care | Payer: Medicaid Other | Source: Ambulatory Visit | Attending: Interventional Radiology | Admitting: Interventional Radiology

## 2021-10-09 DIAGNOSIS — I639 Cerebral infarction, unspecified: Secondary | ICD-10-CM | POA: Diagnosis not present

## 2021-10-09 NOTE — Progress Notes (Signed)
Carotid duplex bilateral study completed.   Please see CV Proc for preliminary results.   Hillarie Harrigan, RDMS, RVT  

## 2021-10-12 ENCOUNTER — Telehealth (HOSPITAL_COMMUNITY): Payer: Self-pay

## 2021-10-12 NOTE — Telephone Encounter (Signed)
Pt agreed to f/u in 6 months with us carotid. AW 

## 2021-11-03 ENCOUNTER — Ambulatory Visit: Payer: Medicaid Other | Admitting: Cardiovascular Disease

## 2021-11-14 ENCOUNTER — Ambulatory Visit: Payer: Medicaid Other | Admitting: Cardiovascular Disease

## 2021-11-14 ENCOUNTER — Ambulatory Visit: Payer: Medicaid Other | Attending: Cardiovascular Disease | Admitting: Cardiovascular Disease

## 2021-11-14 ENCOUNTER — Encounter: Payer: Self-pay | Admitting: Cardiovascular Disease

## 2021-11-14 VITALS — BP 140/84 | HR 55 | Ht 64.0 in | Wt 165.0 lb

## 2021-11-14 DIAGNOSIS — E782 Mixed hyperlipidemia: Secondary | ICD-10-CM | POA: Diagnosis not present

## 2021-11-14 DIAGNOSIS — I2510 Atherosclerotic heart disease of native coronary artery without angina pectoris: Secondary | ICD-10-CM

## 2021-11-14 DIAGNOSIS — I10 Essential (primary) hypertension: Secondary | ICD-10-CM | POA: Diagnosis not present

## 2021-11-14 MED ORDER — ATORVASTATIN CALCIUM 80 MG PO TABS: 80.0000 mg | ORAL_TABLET | Freq: Every day | ORAL | 3 refills | Status: DC

## 2021-11-14 MED ORDER — AMLODIPINE BESYLATE 5 MG PO TABS: 5.0000 mg | ORAL_TABLET | Freq: Every day | ORAL | 3 refills | Status: DC

## 2021-11-14 NOTE — Patient Instructions (Signed)
Medication Instructions:  REFILLED Atorvastatin and Amlodipine *If you need a refill on your cardiac medications before your next appointment, please call your pharmacy*   Lab Work: Lipids, Liver in 4 months  If you have labs (blood work) drawn today and your tests are completely normal, you will receive your results only by: [place] (if you have MyChart) OR A paper copy in the mail If you have any lab test that is abnormal or we need to change your treatment, we will call you to review the results.   Testing/Procedures: NONE   Follow-Up: At [facility], you and your health needs are our priority.  As part of our continuing mission to provide you with exceptional heart care, we have created designated Provider Care Teams.  These Care Teams include your primary Cardiologist (physician) and Advanced Practice Providers (APPs -  Physician Assistants and Nurse Practitioners) who all work together to provide you with the care you need, when you need it.  We recommend signing up for the patient portal called "MyChart".  Sign up information is provided on this After Visit Summary.  MyChart is used to connect with patients for Virtual Visits (Telemedicine).  Patients are able to view lab/test results, encounter notes, upcoming appointments, etc.  Non-urgent messages can be sent to your provider as well.   To learn more about what you can do with MyChart, go to [email or web address].    Your next appointment:   6 month(s)  The format for your next appointment:   In Person  Provider:   With APP, then Sherren Mocha, MD  will plan to see you in 1 year.    Important Information About Sugar

## 2021-11-14 NOTE — Progress Notes (Signed)
Cardiology Office Note:    Date:  11/14/2021   ID:  Kevin Barton, DOB [birth year], MRN [id number]  PCP:  Cain Saupe, MD   [facility] Providers Cardiologist:  Tonny Bollman, MD Cardiology APP:  Kennon Rounds     Referring MD: Cain Saupe, MD   Chief Complaint  Patient presents with   Coronary Artery Disease    History of Present Illness:    Kevin Barton is a 61 y.o. male with a hx of: Coronary artery disease  Known CTO of the RCA w/ L-R collats >> med Rx Hx of CVA (R MCA) in 8/19 >> tx w/ tPA, endovascular intervention/ICA stent Polysubstance abuse Hypertension  Hyperlipidemia  Tobacco abuse   The patient is here alone today.  Reports that he is doing pretty well but he has not been able to get his medications.  He is planning on having right knee surgery in October 2023.  He has already undergone preoperative cardiovascular evaluation and had a nuclear stress test that showed no significant ischemia.  He denies any recent chest pain, chest pressure, shortness of breath, edema, orthopnea, or PND.  He has cut back on smoking and is smoking 3 to 4 cigarettes each day now.  States that he is living in a motel until he can get his knee fixed.  Past Medical History:  Diagnosis Date   Arthritis    CAD (coronary artery disease)    cath 06/2016 occlusion of RCA with collateralization from the left coronary artery. Mild nonobstructive plaque in the left main, LAD, and LCx. LVEF 50%. TX Rx.    Chronic back pain    Chronic knee pain    HTN (hypertension)    Hyperlipidemia    NSTEMI (non-ST elevated myocardial infarction) (HCC)    Stroke (HCC)    Tobacco abuse     Past Surgical History:  Procedure Laterality Date   IR ANGIO INTRA EXTRACRAN SEL COM CAROTID INNOMINATE UNI L MOD SED  10/05/2017   IR ANGIO VERTEBRAL SEL SUBCLAVIAN INNOMINATE UNI R MOD SED  10/05/2017   IR CT HEAD LTD  10/05/2017   IR INTRAVSC STENT CERV CAROTID W/O EMB-PROT MOD SED INC ANGIO   10/05/2017   IR PERCUTANEOUS ART THROMBECTOMY/INFUSION INTRACRANIAL INC DIAG ANGIO  10/05/2017   LEFT HEART CATH AND CORONARY ANGIOGRAPHY N/A 07/25/2016   Procedure: Left Heart Cath and Coronary Angiography;  Surgeon: Tonny Bollman, MD;  Location: [facility] INVASIVE CV LAB;  Service: Cardiovascular;  Laterality: N/A;   RADIOLOGY WITH ANESTHESIA N/A 10/05/2017   Procedure: IR WITH ANESTHESIA CODE STROKE;  Surgeon: Julieanne Cotton, MD;  Location: [facility] OR;  Service: Radiology;  Laterality: N/A;    Current Medications: Current Meds  Medication Sig   amoxicillin (AMOXIL) 500 MG tablet Take 1 tablet (500 mg total) by mouth 3 (three) times daily.   aspirin EC 81 MG tablet Take 1 tablet (81 mg total) by mouth daily.   [DISCONTINUED] amLODipine (NORVASC) 5 MG tablet Take 1 tablet (5 mg total) by mouth daily.   [DISCONTINUED] atorvastatin (LIPITOR) 80 MG tablet Take 1 tablet (80 mg total) by mouth daily at 6 PM.     Allergies:   Patient has no known allergies.   Social History   Socioeconomic History   Marital status: Single    Spouse name: Not on file   Number of children: Not on file   Years of education: Not on file   Highest education level: Not on file  Occupational History  Not on file  Tobacco Use   Smoking status: Every Day    Packs/day: 0.50    Types: Cigarettes   Smokeless tobacco: Never   Tobacco comments:    smoke 6 per week  Vaping Use   Vaping Use: Never used  Substance and Sexual Activity   Alcohol use: Yes    Comment: a beer a day   Drug use: No   Sexual activity: Yes  Other Topics Concern   Not on file  Social History Narrative   Not on file   Social Determinants of Health   Financial Resource Strain: High Risk (09/16/2019)   Overall Financial Resource Strain (CARDIA)    Difficulty of Paying Living Expenses: Hard  Food Insecurity: Food Insecurity Present (09/16/2019)   Hunger Vital Sign    Worried About Running Out of Food in the Last Year: Sometimes true    Ran  Out of Food in the Last Year: Never true  Transportation Needs: No Transportation Needs (09/16/2019)   PRAPARE - Hydrologist (Medical): No    Lack of Transportation (Non-Medical): No  Physical Activity: Not on file  Stress: Not on file  Social Connections: Not on file     Family History: The patient's family history includes Diabetes in his brother.  ROS:   Please see the history of present illness.    All other systems reviewed and are negative.  EKGs/Labs/Other Studies Reviewed:    The following studies were reviewed today: Cardiac Cath 2018: 1. Severe single vessel CAD with total occlusion of the RCA, collateralized from the left coronary artery 2. Mild nonobstructive plaque in the left main, LAD, and LCx 3. Mild segmental LV dysfunction, LVEF 50%   Recommend: medical therapy  Myoview Stress Test 06/13/2021:   The study is normal. The study is low risk.   No ST deviation was noted.   Left ventricular function is normal. Nuclear stress EF: 57 %. The left ventricular ejection fraction is normal (55-65%). End diastolic cavity size is normal. End systolic cavity size is normal.   Prior study not available for comparison.   Fixed inferior perfusion defect with normal wall motion consistent with artifact Low risk study  EKG:  EKG is ordered today.  The ekg ordered today demonstrates sinus bradycardia 55 bpm, otherwise within normal limits  Recent Labs: 01/10/2021: BUN 10; Creatinine, Ser 1.09; Hemoglobin 15.5; Platelets 265; Potassium 4.1; Sodium 139  Recent Lipid Panel    Component Value Date/Time   CHOL 250 (H) 09/10/2019 1603   TRIG 146 09/10/2019 1603   HDL 92 09/10/2019 1603   CHOLHDL 2.7 09/10/2019 1603   CHOLHDL 3.3 10/06/2017 0500   VLDL 22 10/06/2017 0500   LDLCALC 133 (H) 09/10/2019 1603     Risk Assessment/Calculations:           Physical Exam:    VS:  BP (!) 140/84   Pulse (!) 55   Ht 5\' 4"  (1.626 m)   Wt 165 lb (74.8  kg)   SpO2 98%   BMI 28.32 kg/m     Wt Readings from Last 3 Encounters:  11/14/21 165 lb (74.8 kg)  06/13/21 170 lb (77.1 kg)  05/19/21 170 lb 9.6 oz (77.4 kg)     GEN:  Well nourished, well developed in no acute distress HEENT: Normal NECK: No JVD; No carotid bruits LYMPHATICS: No lymphadenopathy CARDIAC: RRR, no murmurs, rubs, gallops RESPIRATORY:  Clear to auscultation without rales, wheezing or rhonchi  ABDOMEN:  Soft, non-tender, non-distended MUSCULOSKELETAL:  No edema; No deformity  SKIN: Warm and dry NEUROLOGIC:  Alert and oriented x 3 PSYCHIATRIC:  Normal affect   ASSESSMENT:    1. Mixed hyperlipidemia   2. Essential (primary) hypertension   3. Coronary artery disease involving native coronary artery of native heart without angina pectoris    PLAN:    In order of problems listed above:  Blood pressure controlled on amlodipine.  Medication refilled and will continue the same. Patient stable without angina.  He continues on aspirin, atorvastatin, and amlodipine.  He has been cleared for knee surgery.  His nuclear stress test is reviewed and it was low risk. It appears he has been off of atorvastatin because he could not get it refilled.  Will resume and check lipids and LFTs in 3 months.  Diet and lifestyle modification discussed with the patient.           Medication Adjustments/Labs and Tests Ordered: Current medicines are reviewed at length with the patient today.  Concerns regarding medicines are outlined above.  Orders Placed This Encounter  Procedures   Lipid panel   Hepatic function panel   EKG 12-Lead   Meds ordered this encounter  Medications   amLODipine (NORVASC) 5 MG tablet    Sig: Take 1 tablet (5 mg total) by mouth daily.    Dispense:  90 tablet    Refill:  3   atorvastatin (LIPITOR) 80 MG tablet    Sig: Take 1 tablet (80 mg total) by mouth daily at 6 PM.    Dispense:  90 tablet    Refill:  3    Patient Instructions  Medication  Instructions:  REFILLED Atorvastatin and Amlodipine *If you need a refill on your cardiac medications before your next appointment, please call your pharmacy*   Lab Work: Lipids, Liver in 4 months  If you have labs (blood work) drawn today and your tests are completely normal, you will receive your results only by: MyChart Message (if you have MyChart) OR A paper copy in the mail If you have any lab test that is abnormal or we need to change your treatment, we will call you to review the results.   Testing/Procedures: NONE   Follow-Up: At [facility], you and your health needs are our priority.  As part of our continuing mission to provide you with exceptional heart care, we have created designated Provider Care Teams.  These Care Teams include your primary Cardiologist (physician) and Advanced Practice Providers (APPs -  Physician Assistants and Nurse Practitioners) who all work together to provide you with the care you need, when you need it.  We recommend signing up for the patient portal called "MyChart".  Sign up information is provided on this After Visit Summary.  MyChart is used to connect with patients for Virtual Visits (Telemedicine).  Patients are able to view lab/test results, encounter notes, upcoming appointments, etc.  Non-urgent messages can be sent to your provider as well.   To learn more about what you can do with MyChart, go to [email or web address].    Your next appointment:   6 month(s)  The format for your next appointment:   In Person  Provider:   With APP, then Tonny Bollman, MD  will plan to see you in 1 year.    Important Information About Sugar         Signed, Tonny Bollman, MD  11/14/2021 1:24 PM    [facility]

## 2022-01-10 ENCOUNTER — Ambulatory Visit: Payer: Medicaid Other | Admitting: Cardiovascular Disease

## 2022-03-13 ENCOUNTER — Other Ambulatory Visit: Payer: Medicaid Other

## 2022-05-15 ENCOUNTER — Telehealth: Payer: Self-pay | Admitting: Orthopaedic Surgery

## 2022-05-15 NOTE — Telephone Encounter (Signed)
Its in a orange envelope on Lauren G desk, it has his name on it.

## 2022-05-15 NOTE — Telephone Encounter (Signed)
Patient came in to drop off accomodation paperwork that needs to be filled would like it done by the end of the week his sister Dennis Bast will pick it up call her at [phone]

## 2022-05-16 ENCOUNTER — Telehealth: Payer: Self-pay

## 2022-05-16 NOTE — Telephone Encounter (Signed)
Patient dropped off housing forms to be filled out by Dr.Xu. We have not seen patient in over a year. Called and left message that Dr.Xu is unable to fill out these forms, he can take these to his PCP.

## 2022-05-30 ENCOUNTER — Emergency Department (HOSPITAL_COMMUNITY): Payer: Medicaid Other

## 2022-05-30 ENCOUNTER — Emergency Department (HOSPITAL_COMMUNITY)
Admission: EM | Admit: 2022-05-30 | Discharge: 2022-05-30 | Discharge status: Against Medical Advice | Payer: Medicaid Other | Attending: Emergency Medicine | Admitting: Emergency Medicine

## 2022-05-30 ENCOUNTER — Encounter (HOSPITAL_COMMUNITY): Payer: Self-pay | Admitting: Emergency Medicine

## 2022-05-30 DIAGNOSIS — M25561 Pain in right knee: Secondary | ICD-10-CM | POA: Diagnosis not present

## 2022-05-30 DIAGNOSIS — M25551 Pain in right hip: Secondary | ICD-10-CM | POA: Diagnosis not present

## 2022-05-30 DIAGNOSIS — Z5321 Procedure and treatment not carried out due to patient leaving prior to being seen by health care provider: Secondary | ICD-10-CM | POA: Diagnosis not present

## 2022-05-30 DIAGNOSIS — M25461 Effusion, right knee: Secondary | ICD-10-CM | POA: Diagnosis not present

## 2022-05-30 DIAGNOSIS — M25552 Pain in left hip: Secondary | ICD-10-CM | POA: Diagnosis not present

## 2022-05-30 DIAGNOSIS — M7989 Other specified soft tissue disorders: Secondary | ICD-10-CM | POA: Diagnosis not present

## 2022-05-30 NOTE — ED Notes (Signed)
Pt is not answering  ?

## 2022-05-30 NOTE — ED Notes (Signed)
Called no answer x 1   

## 2022-05-30 NOTE — ED Triage Notes (Signed)
R knee pain- states needs it replaced. Bilateral hip pain. Chronic in nature. Reports knee is sore feeling.

## 2022-05-31 ENCOUNTER — Other Ambulatory Visit: Payer: Self-pay

## 2022-05-31 ENCOUNTER — Emergency Department (HOSPITAL_COMMUNITY)
Admission: EM | Admit: 2022-05-31 | Discharge: 2022-05-31 | Disposition: A | Discharge status: Home / Self Care | Payer: Medicaid Other | Attending: Emergency Medicine | Admitting: Emergency Medicine

## 2022-05-31 ENCOUNTER — Encounter (HOSPITAL_COMMUNITY): Payer: Self-pay

## 2022-05-31 DIAGNOSIS — I160 Hypertensive urgency: Secondary | ICD-10-CM | POA: Diagnosis not present

## 2022-05-31 DIAGNOSIS — Z7982 Long term (current) use of aspirin: Secondary | ICD-10-CM | POA: Insufficient documentation

## 2022-05-31 DIAGNOSIS — M25561 Pain in right knee: Secondary | ICD-10-CM | POA: Diagnosis present

## 2022-05-31 DIAGNOSIS — I10 Essential (primary) hypertension: Secondary | ICD-10-CM | POA: Diagnosis not present

## 2022-05-31 DIAGNOSIS — Z79899 Other long term (current) drug therapy: Secondary | ICD-10-CM | POA: Diagnosis not present

## 2022-05-31 DIAGNOSIS — M25461 Effusion, right knee: Secondary | ICD-10-CM | POA: Insufficient documentation

## 2022-05-31 MED ORDER — NAPROXEN 250 MG PO TABS
375.0000 mg | ORAL_TABLET | Freq: Once | ORAL | Status: AC
  Administered 2022-05-31: 375 mg via ORAL
  Filled 2022-05-31: qty 2

## 2022-05-31 MED ORDER — NAPROXEN 375 MG PO TABS: 375.0000 mg | ORAL_TABLET | Freq: Two times a day (BID) | ORAL | 0 refills | Status: DC

## 2022-05-31 NOTE — ED Triage Notes (Signed)
  R knee pain- states needs it replaced. Bilateral hip pain. Chronic in nature. Reports knee is sore feeling.

## 2022-05-31 NOTE — ED Provider Notes (Signed)
[facility] Provider Note   CSN: [id number] Arrival date & time: 05/31/22  [id number]     History  Chief Complaint  Patient presents with   Knee Pain    Kevin Barton is a 62 y.o. male.  HPI 62 year old male presents with right knee pain.  He has been dealing with this for years, particularly worse over the last 2 years per his report.  When the weather changed over the last couple days it has caused a significant worsening in his pain.  When asked if his knee is swollen he states it is all the time swollen though it does seem to imply that it is a little worse recently.  He has not had a fever.  He is able to bend it though it hurts and he is able to ambulate.  He reports Tylenol and ibuprofen do not help.  He has seen an orthopedist and told he needs a knee replacement.  Home Medications Prior to Admission medications   Medication Sig Start Date End Date Taking? Authorizing Provider  naproxen (NAPROSYN) 375 MG tablet Take 1 tablet (375 mg total) by mouth 2 (two) times daily. 05/31/22  Yes Sherwood Gambler, MD  amLODipine (NORVASC) 5 MG tablet Take 1 tablet (5 mg total) by mouth daily. 11/14/21   Sherren Mocha, MD  amoxicillin (AMOXIL) 500 MG tablet Take 1 tablet (500 mg total) by mouth 3 (three) times daily. 05/11/21   Argentina Donovan, PA-C  aspirin EC 81 MG tablet Take 1 tablet (81 mg total) by mouth daily. 09/10/19   Ladell Pier, MD  atorvastatin (LIPITOR) 80 MG tablet Take 1 tablet (80 mg total) by mouth daily at 6 PM. 11/14/21   Sherren Mocha, MD  methylPREDNISolone (MEDROL DOSEPAK) 4 MG TBPK tablet Day 1: 8mg  before breakfast, 4 mg after lunch, 4 mg after supper, and 8 mg at bedtime Day 2: 4 mg before breakfast, 4 mg after lunch, 4 mg  after supper, and 8 mg  at bedtime Day 3:  4 mg  before breakfast, 4 mg  after lunch, 4 mg after supper, and 4 mg  at bedtime Day 4: 4 mg  before breakfast, 4 mg  after lunch, and 4 mg at bedtime Day 5: 4  mg  before breakfast and 4 mg at bedtime Day 6: 4 mg  before breakfast Patient not taking: Reported on 11/14/2021 01/10/21   Deno Etienne, DO  ticagrelor (BRILINTA) 90 MG TABS tablet Take 1 tablet (90 mg total) by mouth 2 (two) times daily. Patient not taking: Reported on 11/14/2021 05/11/21   Argentina Donovan, PA-C      Allergies    Patient has no known allergies.    Review of Systems   Review of Systems  Constitutional:  Negative for fever.  Musculoskeletal:  Positive for arthralgias and joint swelling.    Physical Exam Updated Vital Signs BP (!) 161/80   Pulse 64   Temp 98.4 F (36.9 C)   Resp 15   Ht 5\' 4"  (1.626 m)   Wt 74.8 kg   SpO2 98%   BMI 28.31 kg/m  Physical Exam Vitals and nursing note reviewed.  Constitutional:      Appearance: He is well-developed.  HENT:     Head: Normocephalic and atraumatic.  Cardiovascular:     Rate and Rhythm: Normal rate and regular rhythm.     Pulses:          Posterior tibial pulses  are 2+ on the right side.  Pulmonary:     Effort: Pulmonary effort is normal.  Musculoskeletal:     Right knee: Effusion present. No erythema. Tenderness present.     Comments: Some slight reduction in flexion due to pain. Can fully extend right knee. There is an effusion but no erythema or significant warmth.   Skin:    General: Skin is warm and dry.     Findings: No erythema.  Neurological:     Mental Status: He is alert.     ED Results / Procedures / Treatments   Labs (all labs ordered are listed, but only abnormal results are displayed) Labs Reviewed - No data to display  EKG None  Radiology DG Knee Complete 4 Views Right  Result Date: 05/30/2022 CLINICAL DATA:  Right knee swelling and pain EXAM: RIGHT KNEE - COMPLETE 4+ VIEW COMPARISON:  03/23/2021 FINDINGS: Frontal, bilateral oblique, lateral views of the right knee are obtained on 5 images. No fracture, subluxation, or dislocation. Stable 3 compartmental osteoarthritis greatest in the  lateral and patellofemoral compartments. Large joint effusion, increased since prior study. Stable synovial osteochondromatosis. Diffuse atherosclerosis. IMPRESSION: 1. Severe 3 compartmental osteoarthritis, greatest in the lateral and patellofemoral compartments. 2. Large joint effusion, increased since prior exam. 3. Stable synovial osteochondromatosis. Electronically Signed   By: Randa Ngo M.D.   On: 05/30/2022 20:44    Procedures Procedures    Medications Ordered in ED Medications  naproxen (NAPROSYN) tablet 375 mg (has no administration in time range)    ED Course/ Medical Decision Making/ A&P                             Medical Decision Making Risk Prescription drug management.   I personally viewed and interpreted the knee x-ray that was obtained yesterday evening.  Significant arthritis and a joint effusion.  Overall patient's symptoms sound much more chronic.  I did offer him a therapeutic arthrocentesis though I do have a pretty low suspicion of septic joint.  He has declined.  Otherwise, I think we can temporize with some stronger NSAIDs and have him follow-up with his orthopedist.  Does not appear that he is currently taking the Brilinta I think NSAIDs would be okay.  Otherwise he is hypertensive though has been in the waiting room all night and did not take his meds and states he has enough of his meds so I think he can follow-up with his PCP for asymptomatic hypertension.  Will give return precautions.        Final Clinical Impression(s) / ED Diagnoses Final diagnoses:  Effusion of right knee joint  Hypertensive urgency    Rx / DC Orders ED Discharge Orders          Ordered    naproxen (NAPROSYN) 375 MG tablet  2 times daily        05/31/22 0830              Sherwood Gambler, MD 05/31/22 [id number]

## 2022-05-31 NOTE — ED Notes (Signed)
Pt was discharged after no answer x5 by this tech at 2035 (4/3), pt fell asleep and has rechecked back in.

## 2022-05-31 NOTE — ED Notes (Signed)
Reviewed discharge instructions with patient. Follow-up care and medications reviewed. Patient verbalized understanding. Patient A&Ox4, VSS, and ambulatory with cane upon departure.Marland Kitchen

## 2022-05-31 NOTE — Discharge Instructions (Signed)
You are being given an anti-inflammatory called naproxen.  This is similar to ibuprofen as he cannot take ibuprofen or other NSAIDs.  You may still take Tylenol.  Follow-up with your orthopedist for long-term management of your knee.  If you develop the inability to walk due to pain, fever, redness to your knee, or any other new/concerning symptoms and return to the ER or call 911.

## 2022-05-31 NOTE — ED Notes (Signed)
Pt irritable during assessment.

## 2022-06-04 NOTE — Progress Notes (Unsigned)
Cardiology Office Note:    Date:  06/05/2022  ID:  [name], DOB [birth year], MRN [id number] [facility] Providers Cardiologist:  Tonny Bollman, MD Cardiology APP:  Beatrice Lecher, PA-C      Patient Profile:   Coronary artery disease  Known CTO of the RCA w/ L-R collats >> med Rx Cath 07/25/16: oLCx 30, mRCA 100 w L-R collat, EF 45-50  TTE 10/06/17: Moderate LVH, EF 65-70, normal wall motion, grade 2 diastolic dysfunction, mild MAC, trivial MR, normal RVSF  Myoview 06/13/21: EF 57, no ischemia, low risk  Hx of CVA (R MCA) in 8/19 >> tx w/ tPA, endovascular intervention/ICA stent Carotid US 03/23/21: RICA stent ok, LICA 40-59 Polysubstance abuse Hypertension  Hyperlipidemia  Tobacco abuse     History of Present Illness:   Alxander Cappella is a 62 y.o. male who returns for f/u on CAD. He was last seen 11/14/21 by Dr. Excell Seltzer. He is here alone. He still has not had his knee surgery. He is doing well w/o chest pain, shortness of breath, syncope, leg edema.   Review of Systems  Gastrointestinal:  Negative for hematochezia and melena.  Genitourinary:  Negative for hematuria.     Studies Reviewed:    EKG:  not done    Risk Assessment/Calculations:             Physical Exam:   VS:  BP 130/66   Pulse 60   Ht 5\' 4"  (1.626 m)   Wt 156 lb (70.8 kg)   SpO2 99%   BMI 26.78 kg/m    Wt Readings from Last 3 Encounters:  06/05/22 156 lb (70.8 kg)  05/31/22 164 lb 14.5 oz (74.8 kg)  11/14/21 165 lb (74.8 kg)    Constitutional:      Appearance: Healthy appearance. Not in distress.  Neck:     Vascular: JVD normal.  Pulmonary:     Breath sounds: Normal breath sounds. No wheezing. No rales.  Cardiovascular:     Normal rate. Regular rhythm. Normal S1. Normal S2.      Murmurs: There is no murmur.  Edema:    Peripheral edema absent.  Abdominal:     Palpations: Abdomen is soft.       ASSESSMENT AND PLAN:   Coronary artery disease involving native coronary artery of native  heart without angina pectoris He has a known occlusion of the RCA with no sig CAD elsewhere by cath in 2018. Myoview in 05/2021 was low risk and neg for ischemia. He is not having chest pain or shortness of breath to suggest angina. Continue ASA 81 mg once daily, Lipitor 80 mg once daily. F/u 1 year.   History of CVA (cerebrovascular accident) without residual deficits S/p R MCA IR intervention and ICA stenting. Followed by neuroradiology. He notes he needs his f/u US arranged. We will reach out to IR to get this scheduled for him. He remains on Brilinta per neurology.   Essential (primary) hypertension BP is controlled. Continue Amlodipine 5 mg once daily.   Hyperlipidemia LDL goal <70 Continue Atorvastatin 80 mg once daily. He is fasting today. Check CMET, Lipds today.  Tobacco abuse I have again recommended cessation.  Primary osteoarthritis of right knee He is still waiting on knee surgery. Myoview last year was low risk. He may proceed at acceptable risk when ready.        Dispo:  Return in about 1 year (around 06/05/2023) for Routine Follow Up, w/ Dr. Excell Seltzer, or Tereso Newcomer,  PA-C.  Signed, Tereso Newcomer, PA-C

## 2022-06-05 ENCOUNTER — Other Ambulatory Visit (HOSPITAL_COMMUNITY): Payer: Self-pay | Admitting: Interventional Radiology

## 2022-06-05 ENCOUNTER — Encounter: Payer: Self-pay | Admitting: Physician Assistant

## 2022-06-05 ENCOUNTER — Ambulatory Visit: Payer: Medicaid Other | Attending: Physician Assistant | Admitting: Physician Assistant

## 2022-06-05 VITALS — BP 130/66 | HR 60 | Ht 64.0 in | Wt 156.0 lb

## 2022-06-05 DIAGNOSIS — I2510 Atherosclerotic heart disease of native coronary artery without angina pectoris: Secondary | ICD-10-CM | POA: Diagnosis not present

## 2022-06-05 DIAGNOSIS — Z8673 Personal history of transient ischemic attack (TIA), and cerebral infarction without residual deficits: Secondary | ICD-10-CM

## 2022-06-05 DIAGNOSIS — E785 Hyperlipidemia, unspecified: Secondary | ICD-10-CM | POA: Diagnosis not present

## 2022-06-05 DIAGNOSIS — I10 Essential (primary) hypertension: Secondary | ICD-10-CM

## 2022-06-05 DIAGNOSIS — M1711 Unilateral primary osteoarthritis, right knee: Secondary | ICD-10-CM | POA: Diagnosis not present

## 2022-06-05 DIAGNOSIS — I639 Cerebral infarction, unspecified: Secondary | ICD-10-CM

## 2022-06-05 DIAGNOSIS — E782 Mixed hyperlipidemia: Secondary | ICD-10-CM

## 2022-06-05 DIAGNOSIS — Z720 Tobacco use: Secondary | ICD-10-CM

## 2022-06-05 NOTE — Assessment & Plan Note (Signed)
Continue Atorvastatin 80 mg once daily. He is fasting today. Check CMET, Lipds today.

## 2022-06-05 NOTE — Assessment & Plan Note (Signed)
He is still waiting on knee surgery. Myoview last year was low risk. He may proceed at acceptable risk when ready.

## 2022-06-05 NOTE — Assessment & Plan Note (Signed)
I have again recommended cessation. 

## 2022-06-05 NOTE — Patient Instructions (Signed)
Medication Instructions:  Your physician recommends that you continue on your current medications as directed. Please refer to the Current Medication list given to you today.  *If you need a refill on your cardiac medications before your next appointment, please call your pharmacy*   Lab Work: TODAY:  CMET & LIPID  If you have labs (blood work) drawn today and your tests are completely normal, you will receive your results only by: MyChart Message (if you have MyChart) OR A paper copy in the mail If you have any lab test that is abnormal or we need to change your treatment, we will call you to review the results.   Testing/Procedures: None ordered   Follow-Up: At [facility], you and your health needs are our priority.  As part of our continuing mission to provide you with exceptional heart care, we have created designated Provider Care Teams.  These Care Teams include your primary Cardiologist (physician) and Advanced Practice Providers (APPs -  Physician Assistants and Nurse Practitioners) who all work together to provide you with the care you need, when you need it.  We recommend signing up for the patient portal called "MyChart".  Sign up information is provided on this After Visit Summary.  MyChart is used to connect with patients for Virtual Visits (Telemedicine).  Patients are able to view lab/test results, encounter notes, upcoming appointments, etc.  Non-urgent messages can be sent to your provider as well.   To learn more about what you can do with MyChart, go to [email or web address].    Your next appointment:   1 year(s)  Provider:   Tonny Bollman, MD  or Tereso Newcomer, PA-C         Other Instructions

## 2022-06-05 NOTE — Assessment & Plan Note (Signed)
He has a known occlusion of the RCA with no sig CAD elsewhere by cath in 2018. Myoview in 05/2021 was low risk and neg for ischemia. He is not having chest pain or shortness of breath to suggest angina. Continue ASA 81 mg once daily, Lipitor 80 mg once daily. F/u 1 year.

## 2022-06-05 NOTE — Assessment & Plan Note (Signed)
S/p R MCA IR intervention and ICA stenting. Followed by neuroradiology. He notes he needs his f/u US arranged. We will reach out to IR to get this scheduled for him. He remains on Brilinta per neurology.

## 2022-06-05 NOTE — Assessment & Plan Note (Signed)
BP is controlled. Continue Amlodipine 5 mg once daily.

## 2022-06-06 ENCOUNTER — Telehealth: Payer: Self-pay | Admitting: *Deleted

## 2022-06-06 DIAGNOSIS — E782 Mixed hyperlipidemia: Secondary | ICD-10-CM

## 2022-06-06 LAB — COMPREHENSIVE METABOLIC PANEL
ALT: 11 IU/L (ref 0–44)
AST: 20 IU/L (ref 0–40)
Albumin/Globulin Ratio: 1.5 (ref 1.2–2.2)
Albumin: 4 g/dL (ref 3.8–4.9)
Alkaline Phosphatase: 68 IU/L (ref 44–121)
BUN/Creatinine Ratio: 13 (ref 9–20)
BUN: 11 mg/dL (ref 6–24)
Bilirubin Total: 0.2 mg/dL (ref 0.0–1.2)
CO2: 25 mmol/L (ref 20–29)
Calcium: 9.2 mg/dL (ref 8.7–10.2)
Chloride: 104 mmol/L (ref 96–106)
Creatinine, Ser: 0.85 mg/dL (ref 0.76–1.27)
Globulin, Total: 2.7 g/dL (ref 1.5–4.5)
Glucose: 98 mg/dL (ref 70–99)
Potassium: 4.7 mmol/L (ref 3.5–5.2)
Sodium: 141 mmol/L (ref 134–144)
Total Protein: 6.7 g/dL (ref 6.0–8.5)
eGFR: 100 mL/min/{1.73_m2} (ref >59)

## 2022-06-06 LAB — LIPID PANEL
Chol/HDL Ratio: 3.1 ratio (ref 0.0–5.0)
Cholesterol, Total: 212 mg/dL — ABNORMAL HIGH (ref 100–199)
HDL: 68 mg/dL (ref >39)
LDL Chol Calc (NIH): 127 mg/dL — ABNORMAL HIGH (ref 0–99)
Triglycerides: 97 mg/dL (ref 0–149)
VLDL Cholesterol Cal: 17 mg/dL (ref 5–40)

## 2022-06-06 NOTE — Telephone Encounter (Signed)
-----   Message from Beatrice Lecher, [place] sent at 06/06/2022  1:41 PM EDT ----- LDL above goal.  Creatinine, K+, LFTs normal. PLAN:  -Confirm that the patient is taking atorvastatin 80 mg every day -If yes, add Zetia 10 mg daily -If no, resume atorvastatin 80 mg daily -Fasting lipids, LFTs in 3 months Tereso Newcomer, PA-C    06/06/2022 1:40 PM

## 2022-06-14 DIAGNOSIS — E559 Vitamin D deficiency, unspecified: Secondary | ICD-10-CM | POA: Diagnosis not present

## 2022-06-14 DIAGNOSIS — I6529 Occlusion and stenosis of unspecified carotid artery: Secondary | ICD-10-CM | POA: Diagnosis not present

## 2022-06-14 DIAGNOSIS — Z1159 Encounter for screening for other viral diseases: Secondary | ICD-10-CM | POA: Diagnosis not present

## 2022-06-14 DIAGNOSIS — Z125 Encounter for screening for malignant neoplasm of prostate: Secondary | ICD-10-CM | POA: Diagnosis not present

## 2022-06-14 DIAGNOSIS — Z0000 Encounter for general adult medical examination without abnormal findings: Secondary | ICD-10-CM | POA: Diagnosis not present

## 2022-06-14 DIAGNOSIS — R5383 Other fatigue: Secondary | ICD-10-CM | POA: Diagnosis not present

## 2022-06-25 ENCOUNTER — Ambulatory Visit (HOSPITAL_COMMUNITY)
Admission: RE | Admit: 2022-06-25 | Discharge: 2022-06-25 | Disposition: A | Discharge status: Home / Self Care | Payer: Medicaid Other | Source: Ambulatory Visit | Attending: Interventional Radiology | Admitting: Interventional Radiology

## 2022-06-25 DIAGNOSIS — I639 Cerebral infarction, unspecified: Secondary | ICD-10-CM | POA: Insufficient documentation

## 2022-06-25 NOTE — Progress Notes (Signed)
VASCULAR LAB    Carotid duplex has been performed.  See CV proc for preliminary results.   Prapti Grussing, RVT 06/25/2022, 1:59 PM

## 2022-06-29 DIAGNOSIS — L03031 Cellulitis of right toe: Secondary | ICD-10-CM | POA: Diagnosis not present

## 2022-06-29 DIAGNOSIS — B351 Tinea unguium: Secondary | ICD-10-CM | POA: Diagnosis not present

## 2022-06-29 DIAGNOSIS — L03032 Cellulitis of left toe: Secondary | ICD-10-CM | POA: Diagnosis not present

## 2022-06-29 DIAGNOSIS — L600 Ingrowing nail: Secondary | ICD-10-CM | POA: Diagnosis not present

## 2022-06-29 DIAGNOSIS — M7989 Other specified soft tissue disorders: Secondary | ICD-10-CM | POA: Diagnosis not present

## 2022-07-02 ENCOUNTER — Telehealth (HOSPITAL_COMMUNITY): Payer: Self-pay

## 2022-07-02 NOTE — Telephone Encounter (Signed)
Pt agreed to f/u in 6 months with a us carotid. AB  

## 2022-07-03 DIAGNOSIS — Z1211 Encounter for screening for malignant neoplasm of colon: Secondary | ICD-10-CM | POA: Diagnosis not present

## 2022-08-21 ENCOUNTER — Ambulatory Visit (INDEPENDENT_AMBULATORY_CARE_PROVIDER_SITE_OTHER): Payer: Medicaid Other | Admitting: Orthopaedic Surgery

## 2022-08-21 ENCOUNTER — Other Ambulatory Visit (INDEPENDENT_AMBULATORY_CARE_PROVIDER_SITE_OTHER): Payer: Medicaid Other

## 2022-08-21 DIAGNOSIS — M1711 Unilateral primary osteoarthritis, right knee: Secondary | ICD-10-CM | POA: Diagnosis not present

## 2022-08-21 NOTE — Progress Notes (Signed)
Office Visit Note   Patient: Kevin Barton           Date of Birth: 1960-12-29           MRN: [id number] Visit Date: 08/21/2022              Requested by: Kevin referring provider defined for this encounter. PCP: Patient, Kevin Barton   Assessment & Plan: Visit Diagnoses:  1. Primary osteoarthritis of right knee     Plan: Kevin Barton is a 62 year old gentleman with end-stage right knee valgus DJD with bone-on-bone changes of the lateral compartment.  Kevin Barton has tentatively scheduled and right total knee replacement for September.  Questions encouraged and answered.  Again risk benefits rehab recovery reviewed with the patient.  Patient was cleared by cardiology this past September after a stress test.  Follow-Up Instructions: Kevin follow-ups on file.   Orders:  Orders Placed This Encounter  Procedures   XR KNEE 3 VIEW RIGHT   Kevin orders of the defined types were placed in this encounter.     Procedures: Kevin procedures performed   Clinical Data: Kevin additional findings.   Subjective: Chief Complaint  Patient presents with   Right Knee - Follow-up    HPI Kevin Barton returns today for follow-up for right knee pain.  He has felt Kevin relief from cortisone injections or physical therapy.  Review of Systems  Constitutional: Negative.   HENT: Negative.    Eyes: Negative.   Respiratory: Negative.    Cardiovascular: Negative.   Gastrointestinal: Negative.   Endocrine: Negative.   Genitourinary: Negative.   Skin: Negative.   Allergic/Immunologic: Negative.   Neurological: Negative.   Hematological: Negative.   Psychiatric/Behavioral: Negative.    All other systems reviewed and are negative.    Objective: Vital Signs: There were Kevin vitals taken for this visit.  Physical Exam Vitals and nursing note reviewed.  Constitutional:      Appearance: He is well-developed.  HENT:     Head: Normocephalic and atraumatic.  Eyes:     Pupils: Pupils are equal, round, and reactive to  light.  Pulmonary:     Effort: Pulmonary effort is normal.  Abdominal:     Palpations: Abdomen is soft.  Musculoskeletal:        General: Normal range of motion.     Cervical back: Neck supple.  Skin:    General: Skin is warm.  Neurological:     Mental Status: He is alert and oriented to person, place, and time.  Psychiatric:        Behavior: Behavior normal.        Thought Content: Thought content normal.        Judgment: Judgment normal.     Ortho Exam Examination right knee shows a valgus alignment.  Kevin flexion contracture.  Lateral joint line tenderness.  Pain and crepitus with range of motion. Specialty Comments:  Kevin specialty comments available.  Imaging: Kevin results found.   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 10/08/2017   Cocaine use 10/08/2017   Intracranial atherosclerosis 10/08/2017   Bradycardia 10/08/2017   Alcohol use 10/08/2017  Obesity (BMI 30.0-34.9) 10/08/2017   Acute blood loss anemia 10/08/2017   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 Mild nonobstructive plaque in the left main, LAD, and LCx. LVEF 50%. TX Rx.    Chronic back pain    Chronic knee pain    HTN (hypertension)    Hyperlipidemia    NSTEMI (non-ST elevated myocardial infarction) (HCC)    Stroke (HCC)    Tobacco abuse     Family History  Problem Relation Age of Onset   Diabetes Brother     Past Surgical History:  Procedure Laterality Date   IR ANGIO INTRA EXTRACRAN SEL COM CAROTID INNOMINATE UNI L MOD SED  10/05/2017   IR ANGIO VERTEBRAL SEL SUBCLAVIAN INNOMINATE UNI R MOD SED  10/05/2017   IR CT HEAD LTD  10/05/2017   IR INTRAVSC STENT CERV CAROTID W/O EMB-PROT MOD SED INC ANGIO  10/05/2017   IR PERCUTANEOUS ART THROMBECTOMY/INFUSION INTRACRANIAL INC DIAG ANGIO  10/05/2017   LEFT HEART CATH AND CORONARY ANGIOGRAPHY N/A 07/25/2016   Procedure: Left Heart Cath and Coronary Angiography;  Surgeon: Tonny Bollman, MD;  Location: [facility] INVASIVE CV LAB;  Service: Cardiovascular;  Laterality: N/A;   RADIOLOGY WITH ANESTHESIA N/A 10/05/2017   Procedure: IR WITH ANESTHESIA CODE STROKE;  Surgeon: Julieanne Cotton, MD;  Location: [facility] OR;  Service: Radiology;  Laterality: N/A;   Social History   Occupational History   Not on file  Tobacco Use   Smoking status: Every Day    Packs/day: .5    Types: Cigarettes   Smokeless tobacco: Never   Tobacco comments:    smoke 6 Barton week  Vaping Use   Vaping Use: Never used  Substance and Sexual Activity   Alcohol use: Yes    Comment: a beer a day   Drug use: Kevin   Sexual activity: Yes

## 2022-09-07 ENCOUNTER — Ambulatory Visit: Payer: Medicaid Other

## 2022-09-11 DIAGNOSIS — E785 Hyperlipidemia, unspecified: Secondary | ICD-10-CM | POA: Diagnosis not present

## 2022-09-11 DIAGNOSIS — E559 Vitamin D deficiency, unspecified: Secondary | ICD-10-CM | POA: Diagnosis not present

## 2022-09-11 DIAGNOSIS — R7303 Prediabetes: Secondary | ICD-10-CM | POA: Diagnosis not present

## 2022-10-02 DIAGNOSIS — Z1211 Encounter for screening for malignant neoplasm of colon: Secondary | ICD-10-CM | POA: Diagnosis not present

## 2022-10-02 DIAGNOSIS — Z789 Other specified health status: Secondary | ICD-10-CM | POA: Diagnosis not present

## 2022-10-02 DIAGNOSIS — Z760 Encounter for issue of repeat prescription: Secondary | ICD-10-CM | POA: Diagnosis not present

## 2022-10-02 DIAGNOSIS — I10 Essential (primary) hypertension: Secondary | ICD-10-CM | POA: Diagnosis not present

## 2022-10-02 DIAGNOSIS — Z122 Encounter for screening for malignant neoplasm of respiratory organs: Secondary | ICD-10-CM | POA: Diagnosis not present

## 2022-10-02 DIAGNOSIS — R7303 Prediabetes: Secondary | ICD-10-CM | POA: Diagnosis not present

## 2022-10-12 ENCOUNTER — Ambulatory Visit: Payer: Medicaid Other | Admitting: Internal Medicine

## 2022-10-19 ENCOUNTER — Ambulatory Visit: Payer: Medicaid Other

## 2022-10-22 NOTE — Progress Notes (Signed)
Surgical Instructions   Your procedure is scheduled on September 9. Report to [facility] Main Entrance "A" at 7:30 A.M., then check in with the Admitting office. Any questions or running late day of surgery: call [phone]  Questions prior to your surgery date: call [phone], Monday-Friday, 8am-4pm. If you experience any cold or flu symptoms such as cough, fever, chills, shortness of breath, etc. between now and your scheduled surgery, please notify us at the above number.     Remember:  Do not eat after midnight the night before your surgery  You may drink clear liquids until 7am the morning of your surgery.   Clear liquids allowed are: Water, Non-Citrus Juices (without pulp), Carbonated Beverages, Clear Tea, Black Coffee Only (NO MILK, CREAM OR POWDERED CREAMER of any kind), and Gatorade.  The night before surgery:  No food after midnight. ONLY clear liquids after midnight  The day of surgery (if you do NOT have diabetes):  Drink ONE (1) Pre-Surgery Clear Ensure by 7am the morning of surgery. Drink in one sitting. Do not sip.  This drink was given to you during your hospital  pre-op appointment visit.  Nothing else to drink after completing the  Pre-Surgery Clear Ensure.         If you have questions, please contact your surgeon's office.    Take these medicines the morning of surgery with A SIP OF WATER  amLODipine (NORVASC)    Follow your surgeon's instructions regarding when to stop Aspirin and Brilinta . If no instructions were given, you will need to call the office.    One week prior to surgery, STOP taking any Aleve, Naproxen, Ibuprofen, Motrin, Advil, Goody's, BC's, all herbal medications, fish oil, and non-prescription vitamins.                     Do NOT Smoke (Tobacco/Vaping) for 24 hours prior to your procedure.  If you use a CPAP at night, you may bring your mask/headgear for your overnight stay.   You will be asked to remove any contacts, glasses,  piercing's, hearing aid's, dentures/partials prior to surgery. Please bring cases for these items if needed.    Patients discharged the day of surgery will not be allowed to drive home, and someone needs to stay with them for 24 hours.  SURGICAL WAITING ROOM VISITATION Patients may have no more than 2 support people in the waiting area - these visitors may rotate.   Pre-op nurse will coordinate an appropriate time for 1 ADULT support person, who may not rotate, to accompany patient in pre-op.  Children under the age of 65 must have an adult with them who is not the patient and must remain in the main waiting area with an adult.  If the patient needs to stay at the hospital during part of their recovery, the visitor guidelines for inpatient rooms apply.  Please refer to the [facility] website for the visitor guidelines for any additional information.   If you received a COVID test during your pre-op visit  it is requested that you wear a mask when out in public, stay away from anyone that may not be feeling well and notify your surgeon if you develop symptoms. If you have been in contact with anyone that has tested positive in the last 10 days please notify you surgeon.      Pre-operative 5 CHG Bathing Instructions   You can play a key role in reducing the risk of infection after  surgery. Your skin needs to be as free of germs as possible. You can reduce the number of germs on your skin by washing with CHG (chlorhexidine gluconate) soap before surgery. CHG is an antiseptic soap that kills germs and continues to kill germs even after washing.   DO NOT use if you have an allergy to chlorhexidine/CHG or antibacterial soaps. If your skin becomes reddened or irritated, stop using the CHG and notify one of our RNs at [phone].   Please shower with the CHG soap starting 4 days before surgery using the following schedule:     Please keep in mind the following:  DO NOT shave, including legs  and underarms, starting the day of your first shower.   You may shave your face at any point before/day of surgery.  Place clean sheets on your bed the day you start using CHG soap. Use a clean washcloth (not used since being washed) for each shower. DO NOT sleep with pets once you start using the CHG.   CHG Shower Instructions:  If you choose to wash your hair and private area, wash first with your normal shampoo/soap.  After you use shampoo/soap, rinse your hair and body thoroughly to remove shampoo/soap residue.  Turn the water OFF and apply about 3 tablespoons (45 ml) of CHG soap to a CLEAN washcloth.  Apply CHG soap ONLY FROM YOUR NECK DOWN TO YOUR TOES (washing for 3-5 minutes)  DO NOT use CHG soap on face, private areas, open wounds, or sores.  Pay special attention to the area where your surgery is being performed.  If you are having back surgery, having someone wash your back for you may be helpful. Wait 2 minutes after CHG soap is applied, then you may rinse off the CHG soap.  Pat dry with a clean towel  Put on clean clothes/pajamas   If you choose to wear lotion, please use ONLY the CHG-compatible lotions on the back of this paper.   Additional instructions for the day of surgery: DO NOT APPLY any lotions, deodorants, cologne, or perfumes.   Do not bring valuables to the hospital. [facility] is not responsible for any belongings/valuables. Do not wear nail polish, gel polish, artificial nails, or any other type of covering on natural nails (fingers and toes) Do not wear jewelry or makeup Put on clean/comfortable clothes.  Please brush your teeth.  Ask your nurse before applying any prescription medications to the skin.     CHG Compatible Lotions   Aveeno Moisturizing lotion  Cetaphil Moisturizing Cream  Cetaphil Moisturizing Lotion  Clairol Herbal Essence Moisturizing Lotion, Dry Skin  Clairol Herbal Essence Moisturizing Lotion, Extra Dry Skin  Clairol Herbal Essence  Moisturizing Lotion, Normal Skin  Curel Age Defying Therapeutic Moisturizing Lotion with Alpha Hydroxy  Curel Extreme Care Body Lotion  Curel Soothing Hands Moisturizing Hand Lotion  Curel Therapeutic Moisturizing Cream, Fragrance-Free  Curel Therapeutic Moisturizing Lotion, Fragrance-Free  Curel Therapeutic Moisturizing Lotion, Original Formula  Eucerin Daily Replenishing Lotion  Eucerin Dry Skin Therapy Plus Alpha Hydroxy Crme  Eucerin Dry Skin Therapy Plus Alpha Hydroxy Lotion  Eucerin Original Crme  Eucerin Original Lotion  Eucerin Plus Crme Eucerin Plus Lotion  Eucerin TriLipid Replenishing Lotion  Keri Anti-Bacterial Hand Lotion  Keri Deep Conditioning Original Lotion Dry Skin Formula Softly Scented  Keri Deep Conditioning Original Lotion, Fragrance Free Sensitive Skin Formula  Keri Lotion Fast Absorbing Fragrance Free Sensitive Skin Formula  Keri Lotion Fast Absorbing Softly Scented Dry Skin Formula  Keri Original Lotion  [employer] Skin Renewal Lotion Keri Silky Smooth Lotion  Keri Silky Smooth Sensitive Skin Lotion  Nivea Body Creamy Conditioning Patent examiner Moisturizing Lotion Nivea Crme  Nivea Skin Firming Lotion  NutraDerm 30 Skin Lotion  NutraDerm Skin Lotion  NutraDerm Therapeutic Skin Cream  NutraDerm Therapeutic Skin Lotion  ProShield Protective Hand Cream  Provon moisturizing lotion  Please read over the following fact sheets that you were given.

## 2022-10-23 ENCOUNTER — Encounter (HOSPITAL_COMMUNITY): Payer: Self-pay

## 2022-10-23 ENCOUNTER — Other Ambulatory Visit: Payer: Self-pay

## 2022-10-23 ENCOUNTER — Encounter (HOSPITAL_COMMUNITY)
Admission: RE | Admit: 2022-10-23 | Discharge: 2022-10-23 | Disposition: A | Discharge status: Home / Self Care | Payer: Medicaid Other | Source: Ambulatory Visit | Attending: Orthopaedic Surgery | Admitting: Orthopaedic Surgery

## 2022-10-23 VITALS — BP 155/69 | HR 89 | Temp 98.4°F | Resp 18 | Ht 64.0 in | Wt 163.6 lb

## 2022-10-23 DIAGNOSIS — M1711 Unilateral primary osteoarthritis, right knee: Secondary | ICD-10-CM | POA: Diagnosis not present

## 2022-10-23 DIAGNOSIS — Z01818 Encounter for other preprocedural examination: Secondary | ICD-10-CM

## 2022-10-23 DIAGNOSIS — Z01812 Encounter for preprocedural laboratory examination: Secondary | ICD-10-CM | POA: Diagnosis not present

## 2022-10-23 LAB — BASIC METABOLIC PANEL
Anion gap: 8 (ref 5–15)
BUN: 9 mg/dL (ref 6–20)
CO2: 25 mmol/L (ref 22–32)
Calcium: 9.1 mg/dL (ref 8.9–10.3)
Chloride: 104 mmol/L (ref 98–111)
Creatinine, Ser: 0.92 mg/dL (ref 0.61–1.24)
GFR, Estimated: >60 mL/min (ref >60)
Glucose, Bld: 117 mg/dL — ABNORMAL HIGH (ref 70–99)
Potassium: 4.3 mmol/L (ref 3.5–5.1)
Sodium: 137 mmol/L (ref 135–145)

## 2022-10-23 LAB — SURGICAL PCR SCREEN
MRSA, PCR: NEGATIVE
Staphylococcus aureus: NEGATIVE

## 2022-10-23 LAB — CBC
HCT: 39.6 % (ref 39.0–52.0)
Hemoglobin: 13.7 g/dL (ref 13.0–17.0)
MCH: 31.6 pg (ref 26.0–34.0)
MCHC: 34.6 g/dL (ref 30.0–36.0)
MCV: 91.2 fL (ref 80.0–100.0)
Platelets: 244 10*3/uL (ref 150–400)
RBC: 4.34 MIL/uL (ref 4.22–5.81)
RDW: 13.8 % (ref 11.5–15.5)
WBC: 7 10*3/uL (ref 4.0–10.5)
nRBC: 0 % (ref 0.0–0.2)

## 2022-10-23 NOTE — Progress Notes (Signed)
PCP - Toma Copier [facility] Cardiologist - Tereso Newcomer [facility]  PPM/ICD - Denies  Chest x-ray - N/I EKG - 11/14/21 Stress Test - 06/13/21 ECHO - 10/06/17 Cardiac Cath - 07/25/16  Sleep Study - Denies  DM - Denies  Blood Thinner Instructions:Brinlinta has been stopped for a year  Aspirin Instructions:Requested patient to call Dr. Roda Shutters for instructions  ERAS Protcol -Yes PRE-SURGERY Ensure given   COVID TEST- N/I   Anesthesia review: Yes cardiac history  Patient denies shortness of breath, fever, cough and chest pain at PAT appointment   All instructions explained to the patient, with a verbal understanding of the material. Patient agrees to go over the instructions while at home for a better understanding.  The opportunity to ask questions was provided.

## 2022-10-24 ENCOUNTER — Encounter (HOSPITAL_COMMUNITY): Payer: Self-pay | Admitting: Physician Assistant

## 2022-10-24 ENCOUNTER — Encounter (HOSPITAL_COMMUNITY): Payer: Self-pay | Admitting: Anesthesiology

## 2022-10-24 NOTE — Progress Notes (Signed)
Anesthesia Chart Review:  Follows with cardiology for history of CAD with known CTO of the RCA with left-to-right collaterals with no significant CAD elsewhere by cath 2018.  Myoview 05/2021 was low risk and negative for ischemia. Seen by Tereso Newcomer, PA-C 06/05/22 and cleared for surgery at that time. Per note, "He is still waiting on knee surgery. Myoview last year was low risk. He may proceed at acceptable risk when ready." 1 year followup recommended.   History of CVA s/p right MCA IR intervention and ICA stenting 09/2017.  Followed by Dr. Corliss Skains.  Carotid duplex 05/2022 showed patent right ICA stent, 40 to 59% stenosis in the left ICA.  Current smoker.  Preop labs reviewed, WNL.   EKG 11/14/2021: Sinus bradycardia with sinus arrhythmia.  Rate 55.  Carotid duplex 06/25/2022: Summary:  Right Carotid: Patent ICA stent.   Left Carotid: Velocities in the left ICA are consistent with a 40-59% stenosis. No significant change since prior study.   Vertebrals:  Bilateral vertebral arteries demonstrate antegrade flow.  Subclavians: Normal flow hemodynamics were seen in bilateral subclavian arteries.   Nuclear stress 06/13/2021:   The study is normal. The study is low risk.   No ST deviation was noted.   Left ventricular function is normal. Nuclear stress EF: 57 %. The left ventricular ejection fraction is normal (55-65%). End diastolic cavity size is normal. End systolic cavity size is normal.   Prior study not available for comparison.   Fixed inferior perfusion defect with normal wall motion consistent with artifact Low risk study  TTE 10/06/2017: - Left ventricle: The cavity size was normal. Wall thickness was    increased in a pattern of moderate LVH. Systolic function was    vigorous. The estimated ejection fraction was in the range of 65%    to 70%. Wall motion was normal; there were no regional wall    motion abnormalities. Features are consistent with a pseudonormal    left  ventricular filling pattern, with concomitant abnormal    relaxation and increased filling pressure (grade 2 diastolic    dysfunction).  - Aortic valve: There was no stenosis.  - Mitral valve: Mildly calcified annulus. Mildly calcified leaflets    . There was trivial regurgitation.  - Right ventricle: The cavity size was normal. Systolic function    was normal.  - Pulmonary arteries: No complete TR doppler jet so unable to    estimate PA systolic pressure.  - Inferior vena cava: The vessel was normal in size. The    respirophasic diameter changes were in the normal range (>= 50%),    consistent with normal central venous pressure.   Impressions:   - Normal LV size with moderate LV hypertrophy. EF 60-65%. Moderate    diastolic dysfunction. Normal RV size and systolic function.   Cath 07/25/2016: 1. Severe single vessel CAD with total occlusion of the RCA, collateralized from the left coronary artery 2. Mild nonobstructive plaque in the left main, LAD, and LCx 3. Mild segmental LV dysfunction, LVEF 50%   Recommend: medical therapy    [facility] Short Stay Center/Anesthesiology Phone [phone] 10/24/2022 2:41 PM

## 2022-10-24 NOTE — Anesthesia Preprocedure Evaluation (Addendum)
Anesthesia Evaluation  Patient identified by MRN, date of birth, ID band Patient awake    Reviewed: Allergy & Precautions, NPO status , Patient's Chart, lab work & pertinent test results  Airway       Comment: Previous grade II view with Hyacinth Meeker 2 Dental   Pulmonary Current Smoker and Patient abstained from smoking.          Cardiovascular hypertension (amlodipine), Pt. on medications + CAD and + Past MI    HLD, right ICA occlusion s/p stent  Low-risk stress test 06/13/2021  TTE 10/06/2017: Study Conclusions   - Left ventricle: The cavity size was normal. Wall thickness was    increased in a pattern of moderate LVH. Systolic function was    vigorous. The estimated ejection fraction was in the range of 65%    to 70%. Wall motion was normal; there were no regional wall    motion abnormalities. Features are consistent with a pseudonormal    left ventricular filling pattern, with concomitant abnormal    relaxation and increased filling pressure (grade 2 diastolic    dysfunction).  - Aortic valve: There was no stenosis.  - Mitral valve: Mildly calcified annulus. Mildly calcified leaflets    . There was trivial regurgitation.  - Right ventricle: The cavity size was normal. Systolic function    was normal.  - Pulmonary arteries: No complete TR doppler jet so unable to    estimate PA systolic pressure.  - Inferior vena cava: The vessel was normal in size. The    respirophasic diameter changes were in the normal range (>= 50%),    consistent with normal central venous pressure.   LHC 07/25/2016: 1. Severe single vessel CAD with total occlusion of the RCA, collateralized from the left coronary artery 2. Mild nonobstructive plaque in the left main, LAD, and LCx 3. Mild segmental LV dysfunction, LVEF 50%     Neuro/Psych  Neuromuscular disease (chronic back pain) CVA (2019)    GI/Hepatic ,,,(+)     substance abuse  alcohol use  and cocaine use  Endo/Other    Renal/GU      Musculoskeletal  (+) Arthritis ,    Abdominal   Peds  Hematology  (+) Blood dyscrasia, anemia Lab Results      Component                Value               Date                      WBC                      7.0                 10/23/2022                HGB                      13.7                10/23/2022                HCT                      39.6                10/23/2022  MCV                      91.2                10/23/2022                PLT                      244                 10/23/2022              Anesthesia Other Findings Last Brilinta:  Reproductive/Obstetrics                              Anesthesia Physical Anesthesia Plan  ASA: 3  Anesthesia Plan: MAC, Regional and Spinal   Post-op Pain Management: Tylenol PO (pre-op)*   Induction: Intravenous  PONV Risk Score and Plan: 0 and Ondansetron, Dexamethasone, Propofol infusion and Treatment may vary due to age or medical condition  Airway Management Planned: Natural Airway and Simple Face Mask  Additional Equipment:   Intra-op Plan:   Post-operative Plan:   Informed Consent:      Dental advisory given  Plan Discussed with: CRNA and Anesthesiologist  Anesthesia Plan Comments: (Discussed potential risks of nerve blocks including, but not limited to, infection, bleeding, nerve damage, seizures, pneumothorax, respiratory depression, and potential failure of the block. Alternatives to nerve blocks discussed. All questions answered.  I have discussed risks of neuraxial anesthesia including but not limited to infection, bleeding, nerve injury, back pain, headache, seizures, and failure of block. Patient denies bleeding disorders and is not currently anticoagulated. Labs have been reviewed. Risks and benefits discussed. All patient's questions answered.   Discussed with patient risks of MAC including, but not limited  to, minor pain or discomfort, hearing people in the room, and possible need for backup general anesthesia. Risks for general anesthesia also discussed including, but not limited to, sore throat, hoarse voice, chipped/damaged teeth, injury to vocal cords, nausea and vomiting, allergic reactions, lung infection, heart attack, stroke, and death. All questions answered.   PAT note by Antionette Poles, PA-C: Follows with cardiology for history of CAD with known CTO of the RCA with left-to-right collaterals with no significant CAD elsewhere by cath 2018.  Myoview 05/2021 was low risk and negative for ischemia. Seen by Tereso Newcomer, PA-C 06/05/22 and cleared for surgery at that time. Per note, "He is still waiting on knee surgery. Myoview last year was low risk. He may proceed at acceptable risk when ready." 1 year followup recommended.   History of CVA s/p right MCA IR intervention and ICA stenting 09/2017.  Followed by Dr. Corliss Skains.  Carotid duplex 05/2022 showed patent right ICA stent, 40 to 59% stenosis in the left ICA.  Current smoker.  Preop labs reviewed, WNL.   EKG 11/14/2021: Sinus bradycardia with sinus arrhythmia.  Rate 55.  Carotid duplex 06/25/2022: Summary:  Right Carotid: Patent ICA stent.   Left Carotid: Velocities in the left ICA are consistent with a 40-59% stenosis. No significant change since prior study.   Vertebrals:  Bilateral vertebral arteries demonstrate antegrade flow.  Subclavians: Normal flow hemodynamics were seen in bilateral subclavian arteries.   Nuclear stress 06/13/2021:   The study is normal. The study is low risk.   No ST deviation was noted.   Left ventricular function is  normal. Nuclear stress EF: 57 %. The left ventricular ejection fraction is normal (55-65%). End diastolic cavity size is normal. End systolic cavity size is normal.   Prior study not available for comparison.   1. Fixed inferior perfusion defect with normal wall motion consistent with  artifact 2. Low risk study  TTE 10/06/2017: - Left ventricle: The cavity size was normal. Wall thickness was    increased in a pattern of moderate LVH. Systolic function was    vigorous. The estimated ejection fraction was in the range of 65%    to 70%. Wall motion was normal; there were no regional wall    motion abnormalities. Features are consistent with a pseudonormal    left ventricular filling pattern, with concomitant abnormal    relaxation and increased filling pressure (grade 2 diastolic    dysfunction).  - Aortic valve: There was no stenosis.  - Mitral valve: Mildly calcified annulus. Mildly calcified leaflets    . There was trivial regurgitation.  - Right ventricle: The cavity size was normal. Systolic function    was normal.  - Pulmonary arteries: No complete TR doppler jet so unable to    estimate PA systolic pressure.  - Inferior vena cava: The vessel was normal in size. The    respirophasic diameter changes were in the normal range (>= 50%),    consistent with normal central venous pressure.   Impressions:   - Normal LV size with moderate LV hypertrophy. EF 60-65%. Moderate    diastolic dysfunction. Normal RV size and systolic function.   Cath 07/25/2016: 1. Severe single vessel CAD with total occlusion of the RCA, collateralized from the left coronary artery 2. Mild nonobstructive plaque in the left main, LAD, and LCx 3. Mild segmental LV dysfunction, LVEF 50%   Recommend: medical therapy   )         Anesthesia Quick Evaluation

## 2022-11-02 MED ORDER — TRANEXAMIC ACID 1000 MG/10ML IV SOLN
2000.0000 mg | INTRAVENOUS | Status: DC
  Filled 2022-11-02: qty 20

## 2022-11-02 NOTE — Progress Notes (Signed)
patient voiced understanding of new arrival time of 1130on monday and ERAS/ clear liquids okay until 0830

## 2022-11-05 ENCOUNTER — Ambulatory Visit (HOSPITAL_COMMUNITY)
Admission: RE | Admit: 2022-11-05 | Discharge: 2022-11-05 | Disposition: A | Discharge status: Home / Self Care | Payer: Medicaid Other | Source: Ambulatory Visit | Attending: Orthopaedic Surgery | Admitting: Orthopaedic Surgery

## 2022-11-05 ENCOUNTER — Encounter (HOSPITAL_COMMUNITY)
Admission: RE | Disposition: A | Discharge status: Home / Self Care | Payer: Self-pay | Source: Ambulatory Visit | Attending: Orthopaedic Surgery

## 2022-11-05 ENCOUNTER — Other Ambulatory Visit: Payer: Self-pay | Admitting: Physician Assistant

## 2022-11-05 DIAGNOSIS — M1711 Unilateral primary osteoarthritis, right knee: Secondary | ICD-10-CM | POA: Diagnosis present

## 2022-11-05 DIAGNOSIS — Z538 Procedure and treatment not carried out for other reasons: Secondary | ICD-10-CM | POA: Diagnosis not present

## 2022-11-05 SURGERY — ARTHROPLASTY, KNEE, TOTAL
Anesthesia: Monitor Anesthesia Care | Site: Knee | Laterality: Right

## 2022-11-05 MED ORDER — MIDAZOLAM HCL 2 MG/2ML IJ SOLN
INTRAMUSCULAR | Status: AC
  Filled 2022-11-05: qty 2

## 2022-11-05 MED ORDER — ASPIRIN 81 MG PO TBEC: 81.0000 mg | DELAYED_RELEASE_TABLET | Freq: Two times a day (BID) | ORAL | 0 refills | Status: DC

## 2022-11-05 MED ORDER — ONDANSETRON HCL 4 MG PO TABS: 4.0000 mg | ORAL_TABLET | Freq: Three times a day (TID) | ORAL | 0 refills | Status: DC | PRN

## 2022-11-05 MED ORDER — DOCUSATE SODIUM 100 MG PO CAPS: 100.0000 mg | ORAL_CAPSULE | Freq: Every day | ORAL | 2 refills | Status: DC | PRN

## 2022-11-05 MED ORDER — OXYCODONE-ACETAMINOPHEN 5-325 MG PO TABS
1.0000 | ORAL_TABLET | Freq: Three times a day (TID) | ORAL | 0 refills | Status: DC | PRN
Start: 2022-11-05 — End: 2022-12-19

## 2022-11-05 MED ORDER — METHOCARBAMOL 750 MG PO TABS: 750.0000 mg | ORAL_TABLET | Freq: Two times a day (BID) | ORAL | 2 refills | Status: DC | PRN

## 2022-11-05 MED ORDER — FENTANYL CITRATE (PF) 250 MCG/5ML IJ SOLN
INTRAMUSCULAR | Status: AC
  Filled 2022-11-05: qty 5

## 2022-11-05 NOTE — Discharge Instructions (Signed)

## 2022-11-05 NOTE — Progress Notes (Signed)
Pt reported to this nurse that he ate 2 pieces of ribs at 0800. Per Dr. Isaias Cowman, pt will need to be postponed 8 hours. Per Dr. Roda Shutters, pt will need to be rescheduled. Pt informed that he will need to contact surgeon's office to reschedule. Pt not happy about the cancellation and stated he was told at his time change phone call on Friday that he could eat and drink. RN that completed this call placed a progress note that stated "patient voiced understanding of new arrival time of 1130 on monday and ERAS/ clear liquids okay until 0830". Pt was informed of this and became angry and walked off the unit. Pt stated that he had a ride upon leaving and denied offer of a wheelchair.   Viviano Simas, RN

## 2022-11-20 ENCOUNTER — Encounter: Payer: Medicaid Other | Admitting: Physician Assistant

## 2022-12-17 NOTE — Pre-Procedure Instructions (Signed)
Surgical Instructions   Your procedure is scheduled on Thursday, October 31st. Report to [facility] Main Entrance "A" at 09:50 A.M., then check in with the Admitting office. Any questions or running late day of surgery: call [phone]  Questions prior to your surgery date: call [phone], Monday-Friday, 8am-4pm. If you experience any cold or flu symptoms such as cough, fever, chills, shortness of breath, etc. between now and your scheduled surgery, please notify us at the above number.     Remember:  Do not eat after midnight the night before your surgery  You may drink clear liquids until 09:20 AM the morning of your surgery.   Clear liquids allowed are: Water, Non-Citrus Juices (without pulp), Carbonated Beverages, Clear Tea, Black Coffee Only (NO MILK, CREAM OR POWDERED CREAMER of any kind), and Gatorade.  Patient Instructions  The night before surgery:  No food after midnight. ONLY clear liquids after midnight  The day of surgery (if you do NOT have diabetes):  Drink ONE (1) Pre-Surgery Clear Ensure by 09:20 AM the morning of surgery. Drink in one sitting. Do not sip.  This drink was given to you during your hospital  pre-op appointment visit.  Nothing else to drink after completing the  Pre-Surgery Clear Ensure.         If you have questions, please contact your surgeon's office.    Take these medicines the morning of surgery with A SIP OF WATER  amLODipine (NORVASC)    May take these medicines IF NEEDED: methocarbamol (ROBAXIN-750)  ondansetron (ZOFRAN)  oxyCODONE-acetaminophen (PERCOCET)    Follow your surgeon's instructions on when to stop Aspirin.  If no instructions were given by your surgeon then you will need to call the office to get those instructions.    One week prior to surgery, STOP taking any Aleve, Naproxen, Ibuprofen, Motrin, Advil, Goody's, BC's, all herbal medications, fish oil, and non-prescription vitamins.                     Do NOT Smoke  (Tobacco/Vaping) for 24 hours prior to your procedure.  If you use a CPAP at night, you may bring your mask/headgear for your overnight stay.   You will be asked to remove any contacts, glasses, piercing's, hearing aid's, dentures/partials prior to surgery. Please bring cases for these items if needed.    Patients discharged the day of surgery will not be allowed to drive home, and someone needs to stay with them for 24 hours.  SURGICAL WAITING ROOM VISITATION Patients may have no more than 2 support people in the waiting area - these visitors may rotate.   Pre-op nurse will coordinate an appropriate time for 1 ADULT support person, who may not rotate, to accompany patient in pre-op.  Children under the age of 48 must have an adult with them who is not the patient and must remain in the main waiting area with an adult.  If the patient needs to stay at the hospital during part of their recovery, the visitor guidelines for inpatient rooms apply.  Please refer to the [facility] website for the visitor guidelines for any additional information.   If you received a COVID test during your pre-op visit  it is requested that you wear a mask when out in public, stay away from anyone that may not be feeling well and notify your surgeon if you develop symptoms. If you have been in contact with anyone that has tested positive in the last 10 days  please notify you surgeon.      Pre-operative 5 CHG Bathing Instructions   You can play a key role in reducing the risk of infection after surgery. Your skin needs to be as free of germs as possible. You can reduce the number of germs on your skin by washing with CHG (chlorhexidine gluconate) soap before surgery. CHG is an antiseptic soap that kills germs and continues to kill germs even after washing.   DO NOT use if you have an allergy to chlorhexidine/CHG or antibacterial soaps. If your skin becomes reddened or irritated, stop using the CHG and notify one  of our RNs at [phone].   Please shower with the CHG soap starting 4 days before surgery using the following schedule:     Please keep in mind the following:  DO NOT shave, including legs and underarms, starting the day of your first shower.   You may shave your face at any point before/day of surgery.  Place clean sheets on your bed the day you start using CHG soap. Use a clean washcloth (not used since being washed) for each shower. DO NOT sleep with pets once you start using the CHG.   CHG Shower Instructions:  Wash your face and private area with normal soap. If you choose to wash your hair, wash first with your normal shampoo.  After you use shampoo/soap, rinse your hair and body thoroughly to remove shampoo/soap residue.  Turn the water OFF and apply about 3 tablespoons (45 ml) of CHG soap to a CLEAN washcloth.  Apply CHG soap ONLY FROM YOUR NECK DOWN TO YOUR TOES (washing for 3-5 minutes)  DO NOT use CHG soap on face, private areas, open wounds, or sores.  Pay special attention to the area where your surgery is being performed.  If you are having back surgery, having someone wash your back for you may be helpful. Wait 2 minutes after CHG soap is applied, then you may rinse off the CHG soap.  Pat dry with a clean towel  Put on clean clothes/pajamas   If you choose to wear lotion, please use ONLY the CHG-compatible lotions on the back of this paper.   Additional instructions for the day of surgery: DO NOT APPLY any lotions, deodorants, cologne, or perfumes.   Do not bring valuables to the hospital. [facility] is not responsible for any belongings/valuables. Do not wear nail polish, gel polish, artificial nails, or any other type of covering on natural nails (fingers and toes) Do not wear jewelry or makeup Put on clean/comfortable clothes.  Please brush your teeth.  Ask your nurse before applying any prescription medications to the skin.     CHG Compatible Lotions    Aveeno Moisturizing lotion  Cetaphil Moisturizing Cream  Cetaphil Moisturizing Lotion  Clairol Herbal Essence Moisturizing Lotion, Dry Skin  Clairol Herbal Essence Moisturizing Lotion, Extra Dry Skin  Clairol Herbal Essence Moisturizing Lotion, Normal Skin  Curel Age Defying Therapeutic Moisturizing Lotion with Alpha Hydroxy  Curel Extreme Care Body Lotion  Curel Soothing Hands Moisturizing Hand Lotion  Curel Therapeutic Moisturizing Cream, Fragrance-Free  Curel Therapeutic Moisturizing Lotion, Fragrance-Free  Curel Therapeutic Moisturizing Lotion, Original Formula  Eucerin Daily Replenishing Lotion  Eucerin Dry Skin Therapy Plus Alpha Hydroxy Crme  Eucerin Dry Skin Therapy Plus Alpha Hydroxy Lotion  Eucerin Original Crme  Eucerin Original Lotion  Eucerin Plus Crme Eucerin Plus Lotion  Eucerin TriLipid Replenishing Lotion  Keri Anti-Bacterial Hand Lotion  Keri Deep Conditioning Original Lotion Dry Skin  Formula Softly Scented  Keri Deep Conditioning Original Lotion, Fragrance Free Sensitive Skin Formula  Keri Lotion Fast Absorbing Fragrance Free Sensitive Skin Formula  Keri Lotion Fast Absorbing Softly Scented Dry Skin Formula  Keri Original Lotion  Keri Skin Renewal Lotion Keri Silky Smooth Lotion  Keri Silky Smooth Sensitive Skin Lotion  Nivea Body Creamy Conditioning Oil  Nivea Body Extra Enriched Lotion  Nivea Body Original Lotion  Nivea Body Sheer Moisturizing Lotion Nivea Crme  Nivea Skin Firming Lotion  NutraDerm 30 Skin Lotion  NutraDerm Skin Lotion  NutraDerm Therapeutic Skin Cream  NutraDerm Therapeutic Skin Lotion  ProShield Protective Hand Cream  Provon moisturizing lotion  Please read over the following fact sheets that you were given.

## 2022-12-18 ENCOUNTER — Encounter (HOSPITAL_COMMUNITY): Payer: Self-pay | Admitting: Interventional Radiology

## 2022-12-18 ENCOUNTER — Other Ambulatory Visit: Payer: Self-pay

## 2022-12-18 ENCOUNTER — Encounter (HOSPITAL_COMMUNITY): Payer: Self-pay

## 2022-12-18 ENCOUNTER — Encounter (HOSPITAL_COMMUNITY)
Admission: RE | Admit: 2022-12-18 | Discharge: 2022-12-18 | Disposition: A | Discharge status: Home / Self Care | Payer: Medicaid Other | Source: Ambulatory Visit | Attending: Orthopaedic Surgery | Admitting: Orthopaedic Surgery

## 2022-12-18 VITALS — BP 143/87 | HR 62 | Temp 97.7°F | Resp 18 | Ht 64.0 in | Wt 166.8 lb

## 2022-12-18 DIAGNOSIS — Z01818 Encounter for other preprocedural examination: Secondary | ICD-10-CM | POA: Diagnosis not present

## 2022-12-18 DIAGNOSIS — M1711 Unilateral primary osteoarthritis, right knee: Secondary | ICD-10-CM | POA: Diagnosis not present

## 2022-12-18 DIAGNOSIS — Z01810 Encounter for preprocedural cardiovascular examination: Secondary | ICD-10-CM

## 2022-12-18 DIAGNOSIS — I639 Cerebral infarction, unspecified: Secondary | ICD-10-CM

## 2022-12-18 DIAGNOSIS — F1490 Cocaine use, unspecified, uncomplicated: Secondary | ICD-10-CM

## 2022-12-18 DIAGNOSIS — Z01812 Encounter for preprocedural laboratory examination: Secondary | ICD-10-CM | POA: Diagnosis present

## 2022-12-18 HISTORY — DX: Other psychoactive substance abuse, uncomplicated: F19.10

## 2022-12-18 LAB — CBC
HCT: 41.5 % (ref 39.0–52.0)
Hemoglobin: 14.4 g/dL (ref 13.0–17.0)
MCH: 31.2 pg (ref 26.0–34.0)
MCHC: 34.7 g/dL (ref 30.0–36.0)
MCV: 90 fL (ref 80.0–100.0)
Platelets: 243 10*3/uL (ref 150–400)
RBC: 4.61 MIL/uL (ref 4.22–5.81)
RDW: 14.4 % (ref 11.5–15.5)
WBC: 6.6 10*3/uL (ref 4.0–10.5)
nRBC: 0 % (ref 0.0–0.2)

## 2022-12-18 LAB — SURGICAL PCR SCREEN
MRSA, PCR: NEGATIVE
Staphylococcus aureus: NEGATIVE

## 2022-12-18 LAB — BASIC METABOLIC PANEL
Anion gap: 10 (ref 5–15)
BUN: 12 mg/dL (ref 6–20)
CO2: 23 mmol/L (ref 22–32)
Calcium: 9.6 mg/dL (ref 8.9–10.3)
Chloride: 106 mmol/L (ref 98–111)
Creatinine, Ser: 0.99 mg/dL (ref 0.61–1.24)
GFR, Estimated: >60 mL/min (ref >60)
Glucose, Bld: 88 mg/dL (ref 70–99)
Potassium: 4.2 mmol/L (ref 3.5–5.1)
Sodium: 139 mmol/L (ref 135–145)

## 2022-12-18 LAB — TYPE AND SCREEN
ABO/RH(D): O POS
Antibody Screen: NEGATIVE

## 2022-12-18 NOTE — Progress Notes (Signed)
PCP -  [facility] Cardiologist - Beatrice Lecher, PA-C   PPM/ICD - denies Device Orders - n/a Rep Notified - n/a  Chest x-ray -  EKG - 12-18-22 Stress Test - 06-13-21 ECHO - 10-06-17 Cardiac Cath - 07-25-16  Sleep Study - denies CPAP - na  DM -denies  Blood Thinner Instructions: denies Aspirin Instructions: Instructed patient to reach out surgeons office for instructions regarding ASA  ERAS Protcol - Clear liquids until 9:20 PRE-SURGERY Ensure   COVID TEST- no   Anesthesia review: Yes Hx of MI, CVA, CAD, Htn  Patient denies shortness of breath, fever, cough and chest pain at PAT appointment   All instructions explained to the patient, with a verbal understanding of the material. Patient agrees to go over the instructions while at home for a better understanding. Patient also instructed to self quarantine after being tested for COVID-19. The opportunity to ask questions was provided.

## 2022-12-19 ENCOUNTER — Other Ambulatory Visit: Payer: Self-pay | Admitting: Physician Assistant

## 2022-12-19 ENCOUNTER — Encounter (HOSPITAL_COMMUNITY): Payer: Self-pay | Admitting: Vascular Surgery

## 2022-12-19 MED ORDER — DOCUSATE SODIUM 100 MG PO CAPS: 100.0000 mg | ORAL_CAPSULE | Freq: Every day | ORAL | 2 refills | Status: DC | PRN

## 2022-12-19 MED ORDER — METHOCARBAMOL 750 MG PO TABS: 750.0000 mg | ORAL_TABLET | Freq: Two times a day (BID) | ORAL | 2 refills | Status: DC | PRN

## 2022-12-19 MED ORDER — OXYCODONE-ACETAMINOPHEN 5-325 MG PO TABS: 1.0000 | ORAL_TABLET | Freq: Three times a day (TID) | ORAL | 0 refills | Status: DC | PRN

## 2022-12-19 MED ORDER — ONDANSETRON HCL 4 MG PO TABS: 4.0000 mg | ORAL_TABLET | Freq: Three times a day (TID) | ORAL | 0 refills | Status: DC | PRN

## 2022-12-19 MED ORDER — ASPIRIN 81 MG PO TBEC: 81.0000 mg | DELAYED_RELEASE_TABLET | Freq: Two times a day (BID) | ORAL | 0 refills | Status: AC

## 2022-12-19 NOTE — Progress Notes (Signed)
Rapid urine drug screen needs to be collected DOS.

## 2022-12-20 DIAGNOSIS — R7303 Prediabetes: Secondary | ICD-10-CM | POA: Diagnosis not present

## 2022-12-20 DIAGNOSIS — E785 Hyperlipidemia, unspecified: Secondary | ICD-10-CM | POA: Diagnosis not present

## 2022-12-24 ENCOUNTER — Other Ambulatory Visit: Payer: Self-pay | Admitting: Physician Assistant

## 2022-12-26 MED ORDER — TRANEXAMIC ACID 1000 MG/10ML IV SOLN
2000.0000 mg | INTRAVENOUS | Status: AC
  Filled 2022-12-26: qty 20

## 2022-12-27 ENCOUNTER — Ambulatory Visit (HOSPITAL_COMMUNITY): Admission: RE | Admit: 2022-12-27 | Payer: Medicaid Other | Source: Home / Self Care | Admitting: Orthopaedic Surgery

## 2022-12-27 DIAGNOSIS — M1711 Unilateral primary osteoarthritis, right knee: Secondary | ICD-10-CM

## 2022-12-27 DIAGNOSIS — F1490 Cocaine use, unspecified, uncomplicated: Secondary | ICD-10-CM

## 2022-12-27 DIAGNOSIS — F1910 Other psychoactive substance abuse, uncomplicated: Secondary | ICD-10-CM

## 2022-12-27 SURGERY — ARTHROPLASTY, KNEE, TOTAL
Anesthesia: Spinal | Site: Knee | Laterality: Right

## 2022-12-31 ENCOUNTER — Encounter (HOSPITAL_COMMUNITY)
Admission: AD | Disposition: A | Discharge status: Home / Self Care | Payer: Self-pay | Source: Ambulatory Visit | Attending: Orthopaedic Surgery

## 2022-12-31 ENCOUNTER — Ambulatory Visit (HOSPITAL_COMMUNITY)
Admission: AD | Admit: 2022-12-31 | Discharge: 2022-12-31 | Disposition: A | Discharge status: Home / Self Care | Payer: Medicaid Other | Source: Ambulatory Visit | Attending: Orthopaedic Surgery | Admitting: Orthopaedic Surgery

## 2022-12-31 ENCOUNTER — Encounter (HOSPITAL_COMMUNITY): Payer: Self-pay | Admitting: Anesthesiology

## 2022-12-31 DIAGNOSIS — Z538 Procedure and treatment not carried out for other reasons: Secondary | ICD-10-CM | POA: Diagnosis not present

## 2022-12-31 DIAGNOSIS — M1711 Unilateral primary osteoarthritis, right knee: Secondary | ICD-10-CM | POA: Diagnosis present

## 2022-12-31 DIAGNOSIS — F1910 Other psychoactive substance abuse, uncomplicated: Secondary | ICD-10-CM

## 2022-12-31 LAB — RAPID URINE DRUG SCREEN, HOSP PERFORMED
Amphetamines: NOT DETECTED
Barbiturates: NOT DETECTED
Benzodiazepines: NOT DETECTED
Cocaine: POSITIVE — AB
Opiates: NOT DETECTED
Tetrahydrocannabinol: POSITIVE — AB

## 2022-12-31 SURGERY — TOTAL KNEE ARTHROPLASTY
Anesthesia: Spinal | Site: Knee | Laterality: Right

## 2022-12-31 MED ORDER — CEFAZOLIN SODIUM-DEXTROSE 2-4 GM/100ML-% IV SOLN: 2.0000 g | INTRAVENOUS | Status: DC

## 2022-12-31 MED ORDER — POVIDONE-IODINE 10 % EX SWAB: 2.0000 | Freq: Once | CUTANEOUS | Status: DC

## 2022-12-31 MED ORDER — TRANEXAMIC ACID-NACL 1000-0.7 MG/100ML-% IV SOLN: 1000.0000 mg | INTRAVENOUS | Status: DC

## 2022-12-31 NOTE — Progress Notes (Signed)
Patient was discharged home.

## 2022-12-31 NOTE — Progress Notes (Signed)
UDS positive for cocaine.  Surgery will need to be cancelled.

## 2022-12-31 NOTE — Discharge Instructions (Signed)

## 2022-12-31 NOTE — Progress Notes (Signed)
RN received results of the drug screen. RN notified Dr. Roda Shutters and Dr. Roda Shutters has cancelled the surgery. OR notified of cancellation.  Patient notified of cancellation.

## 2023-01-10 ENCOUNTER — Encounter: Payer: Medicaid Other | Admitting: Physician Assistant

## 2023-01-16 ENCOUNTER — Other Ambulatory Visit (HOSPITAL_COMMUNITY): Payer: Self-pay | Admitting: Interventional Radiology

## 2023-01-16 DIAGNOSIS — I639 Cerebral infarction, unspecified: Secondary | ICD-10-CM

## 2023-01-28 ENCOUNTER — Telehealth (HOSPITAL_COMMUNITY): Payer: Self-pay

## 2023-01-28 NOTE — Telephone Encounter (Signed)
Called to schedule a consult, no answer, no vm. AB

## 2023-02-04 ENCOUNTER — Encounter (HOSPITAL_COMMUNITY): Payer: Self-pay

## 2023-02-04 ENCOUNTER — Ambulatory Visit (HOSPITAL_COMMUNITY): Payer: Medicaid Other | Attending: Interventional Radiology

## 2023-02-12 ENCOUNTER — Ambulatory Visit (HOSPITAL_COMMUNITY)
Admission: RE | Admit: 2023-02-12 | Discharge: 2023-02-12 | Disposition: A | Discharge status: Home / Self Care | Payer: Medicaid Other | Source: Ambulatory Visit | Attending: Interventional Radiology | Admitting: Interventional Radiology

## 2023-02-12 DIAGNOSIS — I639 Cerebral infarction, unspecified: Secondary | ICD-10-CM | POA: Diagnosis not present

## 2023-02-19 ENCOUNTER — Telehealth: Payer: Self-pay | Admitting: Physician Assistant

## 2023-02-19 NOTE — Telephone Encounter (Signed)
  Doppler images reviewed by Dr. Corliss Skains.  Recommend repeat carotid doppler in 6 months.   Dionisia Pacholski S Milton Sagona PA-C 02/19/2023 3:06 PM

## 2023-03-18 ENCOUNTER — Other Ambulatory Visit: Payer: Self-pay

## 2023-03-18 ENCOUNTER — Emergency Department (HOSPITAL_COMMUNITY): Payer: Medicaid Other

## 2023-03-18 ENCOUNTER — Emergency Department (HOSPITAL_COMMUNITY)
Admission: EM | Admit: 2023-03-18 | Discharge: 2023-03-18 | Discharge status: Against Medical Advice | Payer: Medicaid Other | Source: Home / Self Care | Attending: Emergency Medicine | Admitting: Emergency Medicine

## 2023-03-18 ENCOUNTER — Emergency Department (HOSPITAL_COMMUNITY)
Admission: EM | Admit: 2023-03-18 | Discharge: 2023-03-18 | Disposition: A | Discharge status: Home / Self Care | Payer: Medicaid Other | Attending: Emergency Medicine | Admitting: Emergency Medicine

## 2023-03-18 ENCOUNTER — Encounter (HOSPITAL_COMMUNITY): Payer: Self-pay

## 2023-03-18 DIAGNOSIS — M25511 Pain in right shoulder: Secondary | ICD-10-CM | POA: Diagnosis not present

## 2023-03-18 DIAGNOSIS — R0789 Other chest pain: Secondary | ICD-10-CM | POA: Insufficient documentation

## 2023-03-18 DIAGNOSIS — M25552 Pain in left hip: Secondary | ICD-10-CM | POA: Insufficient documentation

## 2023-03-18 DIAGNOSIS — M19011 Primary osteoarthritis, right shoulder: Secondary | ICD-10-CM | POA: Diagnosis not present

## 2023-03-18 DIAGNOSIS — X503XXA Overexertion from repetitive movements, initial encounter: Secondary | ICD-10-CM | POA: Diagnosis not present

## 2023-03-18 DIAGNOSIS — F17200 Nicotine dependence, unspecified, uncomplicated: Secondary | ICD-10-CM | POA: Insufficient documentation

## 2023-03-18 DIAGNOSIS — Z043 Encounter for examination and observation following other accident: Secondary | ICD-10-CM | POA: Diagnosis not present

## 2023-03-18 DIAGNOSIS — R079 Chest pain, unspecified: Secondary | ICD-10-CM

## 2023-03-18 DIAGNOSIS — Z7982 Long term (current) use of aspirin: Secondary | ICD-10-CM | POA: Diagnosis not present

## 2023-03-18 LAB — BASIC METABOLIC PANEL
Anion gap: 9 (ref 5–15)
BUN: 12 mg/dL (ref 6–20)
CO2: 22 mmol/L (ref 22–32)
Calcium: 9.3 mg/dL (ref 8.9–10.3)
Chloride: 104 mmol/L (ref 98–111)
Creatinine, Ser: 1.03 mg/dL (ref 0.61–1.24)
GFR, Estimated: >60 mL/min (ref >60)
Glucose, Bld: 172 mg/dL — ABNORMAL HIGH (ref 70–99)
Potassium: 3.9 mmol/L (ref 3.5–5.1)
Sodium: 135 mmol/L (ref 135–145)

## 2023-03-18 LAB — CBC
HCT: 41.2 % (ref 39.0–52.0)
Hemoglobin: 14.7 g/dL (ref 13.0–17.0)
MCH: 31.2 pg (ref 26.0–34.0)
MCHC: 35.7 g/dL (ref 30.0–36.0)
MCV: 87.5 fL (ref 80.0–100.0)
Platelets: 275 10*3/uL (ref 150–400)
RBC: 4.71 MIL/uL (ref 4.22–5.81)
RDW: 14.5 % (ref 11.5–15.5)
WBC: 6.7 10*3/uL (ref 4.0–10.5)
nRBC: 0 % (ref 0.0–0.2)

## 2023-03-18 LAB — TROPONIN I (HIGH SENSITIVITY)
Troponin I (High Sensitivity): 9 ng/L (ref <18)
Troponin I (High Sensitivity): 9 ng/L (ref <18)

## 2023-03-18 MED ORDER — MELOXICAM 15 MG PO TABS: 15.0000 mg | ORAL_TABLET | Freq: Every day | ORAL | 0 refills | Status: DC

## 2023-03-18 NOTE — ED Notes (Addendum)
Pt asking for provider to be asked about plan for hip pain. He states it was not addressed and if it is not addressed he will walk out and check back in. EDP advised and talking to patient again. Hip pain was addressed, is a chronic issue and pt follows with ortho for the hip.

## 2023-03-18 NOTE — ED Notes (Signed)
Patient coming from XRay  

## 2023-03-18 NOTE — ED Notes (Addendum)
Pt can not be located in the ED at this time. Pt was last seen about an hour ago unhooking himself from central monitoring and walking down the back hallway of the ED.

## 2023-03-18 NOTE — ED Triage Notes (Signed)
Pt came in vai [facility] d/t Rt shoulder pain for the past month & a half, feels the pain behind his neck & top of the shoulder. Also reports Lt hip pan that radiates down to his Lt knee for the past 2 weeks. A/Ox4, rates his pain 10/10, denies any recent falls/injuries.

## 2023-03-18 NOTE — Discharge Instructions (Addendum)
Follow up with your Physician for recheck in 1 week  

## 2023-03-18 NOTE — ED Triage Notes (Signed)
Pt arrives with c/o chest pain that started today. Pt reports pain starts when he smoke a cigarette. Pt has hx of stroke and MI. Pt Pt endorses SOB.

## 2023-03-18 NOTE — ED Provider Notes (Signed)
[place] EMERGENCY DEPARTMENT AT [facility] Provider Note   CSN: [id number] Arrival date & time: 03/18/23  1325     History  Chief Complaint  Patient presents with   Shoulder Pain   Hip Pain    Kevin Barton is a 63 y.o. male.  Patient complains of pain in his right shoulder.  Patient reports that his shoulder has been hurting him for over a month and a half.  Patient states that he has not had any injuries he has not had any falls.  He denies any heavy lifting.  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  Patient does some repetitive motion.  Patient has not had any chest pain.  Patient denies any problems turning his neck.  Patient states the pain does go down to his elbow.  Patient has a past medical history of hypertension.  He states he has been told that he is borderline diabetic   Shoulder Pain Hip Pain       Home Medications Prior to Admission medications   Medication Sig Start Date End Date Taking? Authorizing Provider  amLODipine (NORVASC) 5 MG tablet Take 1 tablet (5 mg total) by mouth daily. 11/14/21   Tonny Bollman, MD  aspirin EC 81 MG tablet Take 1 tablet (81 mg total) by mouth 2 (two) times daily. To be taken after surgery to prevent blood clots if not on any other blood thinners 12/19/22 12/19/23  Cristie Hem, PA-C  atorvastatin (LIPITOR) 80 MG tablet Take 1 tablet (80 mg total) by mouth daily at 6 PM. 11/14/21   Tonny Bollman, MD  docusate sodium (COLACE) 100 MG capsule Take 1 capsule (100 mg total) by mouth daily as needed. 12/19/22 12/19/23  Cristie Hem, PA-C  methocarbamol (ROBAXIN-750) 750 MG tablet Take 1 tablet (750 mg total) by mouth 2 (two) times daily as needed for muscle spasms. 12/19/22   Cristie Hem, PA-C  ondansetron (ZOFRAN) 4 MG tablet Take 1 tablet (4 mg total) by mouth every 8 (eight) hours as needed for nausea or vomiting. 12/19/22   Cristie Hem, PA-C  oxyCODONE-acetaminophen (PERCOCET) 5-325 MG tablet  Take 1-2 tablets by mouth every 8 (eight) hours as needed. 12/19/22   Cristie Hem, PA-C  Vitamin D, Ergocalciferol, (DRISDOL) 1.25 MG (50000 UNIT) CAPS capsule Take 50,000 Units by mouth once a week. 10/01/22   [provider]      Allergies    Patient has no known allergies.    Review of Systems   Review of Systems  Musculoskeletal:  Positive for arthralgias.  All other systems reviewed and are negative.   Physical Exam Updated Vital Signs BP 134/73   Pulse 81   Temp 98 F (36.7 C) (Oral)   Resp 20   Ht 5\' 4"  (1.626 m)   Wt 78.5 kg   SpO2 97%   BMI 29.70 kg/m  Physical Exam Vitals and nursing note reviewed.  Constitutional:      General: He is not in acute distress.    Appearance: He is well-developed.  HENT:     Head: Normocephalic and atraumatic.  Eyes:     Conjunctiva/sclera: Conjunctivae normal.  Cardiovascular:     Rate and Rhythm: Normal rate and regular rhythm.     Heart sounds: No murmur heard. Pulmonary:     Effort: Pulmonary effort is normal. No respiratory distress.     Breath sounds: Normal breath sounds.  Abdominal:     Palpations: Abdomen  is soft.     Tenderness: There is no abdominal tenderness.  Musculoskeletal:        General: Tenderness present. No deformity.     Cervical back: Neck supple.  Skin:    General: Skin is warm and dry.     Capillary Refill: Capillary refill takes less than 2 seconds.  Neurological:     Mental Status: He is alert.  Psychiatric:        Mood and Affect: Mood normal.     ED Results / Procedures / Treatments   Labs (all labs ordered are listed, but only abnormal results are displayed) Labs Reviewed - No data to display  EKG None  Radiology No results found.  Procedures Procedures    Medications Ordered in ED Medications - No data to display  ED Course/ Medical Decision Making/ A&P                                 Medical Decision Making Pt complains of right shoulder pain.  Pt reports  he has had for the past 6 months.    Amount and/or Complexity of Data Reviewed Radiology: ordered and independent interpretation performed. Decision-making details documented in ED Course.    Details: Xray  right shoulder  degenerative changes  Discussion of management or test interpretation with external provider(s): Pt counseled on results.  Pt advised to follow up with his MD.  I will try meloxicam            Final Clinical Impression(s) / ED Diagnoses Final diagnoses:  Acute pain of right shoulder    Rx / DC Orders ED Discharge Orders          Ordered    meloxicam (MOBIC) 15 MG tablet  Daily        03/18/23 1516           An After Visit Summary was printed and given to the patient.    Osie Cheeks 03/18/23 1516    Lorre Nick, MD 03/20/23 1215

## 2023-03-18 NOTE — ED Notes (Signed)
Meal given to pt.

## 2023-03-18 NOTE — ED Provider Notes (Signed)
[place] EMERGENCY DEPARTMENT AT [facility] Provider Note   CSN: [id number] Arrival date & time: 03/18/23  1607     History Chief Complaint  Patient presents with   Chest Pain    HPI Kevin Barton is a 63 y.o. male presenting for chest pain.  63 year old male.  Extensive medical history.  States that he was seen earlier in the day for right arm pain.  When he went out to the ambulance bay to smoke a cigarette he started having left-sided chest pain.  Denies fevers chills nausea vomiting syncope shortness of breath..   Patient's recorded medical, surgical, social, medication list and allergies were reviewed in the Snapshot window as part of the initial history.   Review of Systems   Review of Systems  Constitutional:  Negative for chills and fever.  HENT:  Negative for ear pain and sore throat.   Eyes:  Negative for pain and visual disturbance.  Respiratory:  Negative for cough and shortness of breath.   Cardiovascular:  Positive for chest pain. Negative for palpitations.  Gastrointestinal:  Negative for abdominal pain and vomiting.  Genitourinary:  Negative for dysuria and hematuria.  Musculoskeletal:  Negative for arthralgias and back pain.  Skin:  Negative for color change and rash.  Neurological:  Negative for seizures and syncope.  All other systems reviewed and are negative.   Physical Exam Updated Vital Signs BP (!) 130/95 (BP Location: Right Arm)   Pulse (!) 58   Temp 98.7 F (37.1 C) (Oral)   Resp 19   Wt 78.5 kg   SpO2 99%   BMI 29.70 kg/m  Physical Exam Vitals and nursing note reviewed.  Constitutional:      General: He is not in acute distress.    Appearance: He is well-developed.  HENT:     Head: Normocephalic and atraumatic.  Eyes:     Conjunctiva/sclera: Conjunctivae normal.  Cardiovascular:     Rate and Rhythm: Normal rate and regular rhythm.     Heart sounds: No murmur heard. Pulmonary:     Effort: Pulmonary effort is normal. No  respiratory distress.     Breath sounds: Normal breath sounds.  Abdominal:     Palpations: Abdomen is soft.     Tenderness: There is no abdominal tenderness.  Musculoskeletal:        General: No swelling.     Cervical back: Neck supple.  Skin:    General: Skin is warm and dry.     Capillary Refill: Capillary refill takes less than 2 seconds.  Neurological:     Mental Status: He is alert.  Psychiatric:        Mood and Affect: Mood normal.      ED Course/ Medical Decision Making/ A&P    Procedures Procedures   Medications Ordered in ED Medications - No data to display Medical Decision Making: Kevin Barton is a 63 y.o. male who presented to the ED today with chest pain, detailed above.    Patient placed on continuous vitals and telemetry monitoring while in ED which was reviewed periodically.  Complete initial physical exam performed, notably the patient was HDS in NAD.   Reviewed and confirmed nursing documentation for past medical history, family history, social history.    Initial Assessment: With the patient's presentation of left-sided chest pain, most likely diagnosis is musculoskeletal chest pain versus GERD, although ACS remains on the differential. Other diagnoses were considered including (but not limited to) pulmonary embolism, community-acquired pneumonia, aortic dissection,  pneumothorax, underlying bony abnormality, anemia. These are considered less likely due to history of present illness and physical exam findings.    This is most consistent with an acute life/limb threatening illness complicated by underlying chronic conditions.   Initial Plan: Evaluate for ACS with delta troponin and EKG evaluated as below  Evaluate for dissection, bony abnormality, or pneumonia with chest x-ray and screening laboratory evaluation including CBC, BMP  Further evaluation for pulmonary embolism not indicated at this time based on patient's PERC and Wells score.  Further evaluation  for Thoracic Aortic Dissection not indicated at this time based on patient's clinical history and PE findings.   Initial Study Results: EKG was reviewed independently. Rate, rhythm, axis, intervals all examined and without medically relevant abnormality. ST segments without concerns for elevations.    Laboratory  Delta troponin demonstrated NAA   CBC and BMP without obvious metabolic or inflammatory abnormalities requiring further evaluation   Radiology  DG Chest 2 View Result Date: 03/18/2023 CLINICAL DATA:  Chest pain.  Smoker. EXAM: CHEST - 2 VIEW COMPARISON:  07/04/2016 FINDINGS: The heart size and mediastinal contours are within normal limits. Both lungs are clear. The visualized skeletal structures are unremarkable. IMPRESSION: No active cardiopulmonary disease. Electronically Signed   By: Beckie Salts M.D.   On: 03/18/2023 17:26   DG Shoulder Right Result Date: 03/18/2023 CLINICAL DATA:  Fall EXAM: RIGHT SHOULDER - 2+ VIEW COMPARISON:  None Available. FINDINGS: Visualized portion of the right hemithorax is normal. Osseous irregularity about the acromioclavicular joint is likely degenerative. There are also degenerative cysts about the rotator cuff insertion. No acute fracture or dislocation. IMPRESSION: Degenerative change, without acute osseous finding. Electronically Signed   By: Jeronimo Greaves M.D.   On: 03/18/2023 14:58    Final Assessment and Plan: On repeat clinical assessment, patient has had complete symptomatic resolution after observation.  They are currently ambulatory tolerating p.o. intake.  They deny fevers or chills, nausea vomiting, syncope shortness of breath.  Favor likely musculoskeletal versus gastroesophageal etiology of patient's symptoms.  Considered ACS grossly less likely given resolution, well appearance and negative troponin.  Patient to follow-up with primary care provider within 48 hours for ongoing care and management.        Clinical Impression:  1.  Nonspecific chest pain      Discharge   Final Clinical Impression(s) / ED Diagnoses Final diagnoses:  Nonspecific chest pain    Rx / DC Orders ED Discharge Orders     None         Glyn Ade, MD 03/18/23 2227

## 2023-03-20 ENCOUNTER — Encounter (HOSPITAL_COMMUNITY): Payer: Self-pay | Admitting: Emergency Medicine

## 2023-03-20 ENCOUNTER — Other Ambulatory Visit: Payer: Self-pay

## 2023-03-20 ENCOUNTER — Emergency Department (HOSPITAL_COMMUNITY)
Admission: EM | Admit: 2023-03-20 | Discharge: 2023-03-20 | Disposition: A | Discharge status: Home / Self Care | Payer: Medicaid Other | Attending: Emergency Medicine | Admitting: Emergency Medicine

## 2023-03-20 DIAGNOSIS — Z7982 Long term (current) use of aspirin: Secondary | ICD-10-CM | POA: Diagnosis not present

## 2023-03-20 DIAGNOSIS — M25511 Pain in right shoulder: Secondary | ICD-10-CM | POA: Insufficient documentation

## 2023-03-20 MED ORDER — METHYLPREDNISOLONE ACETATE 40 MG/ML IJ SUSP
20.0000 mg | Freq: Once | INTRAMUSCULAR | Status: AC
  Administered 2023-03-20: 20 mg via INTRA_ARTICULAR
  Filled 2023-03-20: qty 1

## 2023-03-20 MED ORDER — BUPIVACAINE HCL (PF) 0.5 % IJ SOLN
4.0000 mL | Freq: Once | INTRAMUSCULAR | Status: AC
  Administered 2023-03-20: 4 mL via INTRA_ARTICULAR
  Filled 2023-03-20: qty 10

## 2023-03-20 NOTE — ED Provider Notes (Signed)
[address] EMERGENCY DEPARTMENT AT [facility] Provider Note   CSN: [id number] Arrival date & time: 03/20/23  0051     History  Chief Complaint  Patient presents with   Shoulder Pain    Kevin Barton is a 63 y.o. male.  Patient returns with persistent right shoulder pain that significantly worsens with movement of the shoulder.  Denies injury.  Patient was seen in the ED on January 20 with similar complaints, told that he had musculoskeletal pain but it has not improved.  He has not followed up.       Home Medications Prior to Admission medications   Medication Sig Start Date End Date Taking? Authorizing Provider  amLODipine (NORVASC) 5 MG tablet Take 1 tablet (5 mg total) by mouth daily. 11/14/21   Tonny Bollman, MD  aspirin EC 81 MG tablet Take 1 tablet (81 mg total) by mouth 2 (two) times daily. To be taken after surgery to prevent blood clots if not on any other blood thinners Patient not taking: Reported on 03/18/2023 12/19/22 12/19/23  Cristie Hem, PA-C  atorvastatin (LIPITOR) 80 MG tablet Take 1 tablet (80 mg total) by mouth daily at 6 PM. 11/14/21   Tonny Bollman, MD  BAYER LOW DOSE 81 MG tablet Take 81 mg by mouth daily. Swallow whole.    [provider]  BRILINTA 90 MG TABS tablet Take 90 mg by mouth in the morning.    [provider]  docusate sodium (COLACE) 100 MG capsule Take 1 capsule (100 mg total) by mouth daily as needed. Patient not taking: Reported on 03/18/2023 12/19/22 12/19/23  Cristie Hem, PA-C  meloxicam (MOBIC) 15 MG tablet Take 1 tablet (15 mg total) by mouth daily. Patient not taking: Reported on 03/18/2023 03/18/23 03/17/24  Elson Areas, PA-C  methocarbamol (ROBAXIN-750) 750 MG tablet Take 1 tablet (750 mg total) by mouth 2 (two) times daily as needed for muscle spasms. Patient not taking: Reported on 03/18/2023 12/19/22   Cristie Hem, PA-C  ondansetron (ZOFRAN) 4 MG tablet Take 1 tablet (4 mg total) by  mouth every 8 (eight) hours as needed for nausea or vomiting. Patient not taking: Reported on 03/18/2023 12/19/22   Cristie Hem, PA-C  oxyCODONE-acetaminophen (PERCOCET) 5-325 MG tablet Take 1-2 tablets by mouth every 8 (eight) hours as needed. Patient not taking: Reported on 03/18/2023 12/19/22   Cristie Hem, PA-C  Vitamin D, Ergocalciferol, (DRISDOL) 1.25 MG (50000 UNIT) CAPS capsule Take 50,000 Units by mouth once a week. 10/01/22   [provider]      Allergies    Patient has no known allergies.    Review of Systems   Review of Systems  Physical Exam Updated Vital Signs BP (!) 169/77 (BP Location: Right Arm)   Pulse 77   Temp 97.6 F (36.4 C)   Resp 16   Ht 5\' 4"  (1.626 m)   Wt 78.5 kg   SpO2 100%   BMI 29.70 kg/m  Physical Exam Vitals and nursing note reviewed.  Constitutional:      Appearance: Normal appearance.  Eyes:     Pupils: Pupils are equal, round, and reactive to light.  Cardiovascular:     Rate and Rhythm: Normal rate and regular rhythm.     Pulses:          Radial pulses are 2+ on the right side.  Pulmonary:     Effort: Pulmonary effort is normal.     Breath sounds:  Normal breath sounds.  Musculoskeletal:     Right shoulder: Tenderness present. No swelling or deformity. Decreased range of motion (Painful and inhibition).     Cervical back: Normal range of motion.  Neurological:     Mental Status: He is alert.     Cranial Nerves: Cranial nerves 2-12 are intact.     Sensory: Sensation is intact.     Motor: Motor function is intact.     ED Results / Procedures / Treatments   Labs (all labs ordered are listed, but only abnormal results are displayed) Labs Reviewed - No data to display  EKG None  Radiology DG Chest 2 View Result Date: 03/18/2023 CLINICAL DATA:  Chest pain.  Smoker. EXAM: CHEST - 2 VIEW COMPARISON:  07/04/2016 FINDINGS: The heart size and mediastinal contours are within normal limits. Both lungs are clear. The  visualized skeletal structures are unremarkable. IMPRESSION: No active cardiopulmonary disease. Electronically Signed   By: Beckie Salts M.D.   On: 03/18/2023 17:26   DG Shoulder Right Result Date: 03/18/2023 CLINICAL DATA:  Fall EXAM: RIGHT SHOULDER - 2+ VIEW COMPARISON:  None Available. FINDINGS: Visualized portion of the right hemithorax is normal. Osseous irregularity about the acromioclavicular joint is likely degenerative. There are also degenerative cysts about the rotator cuff insertion. No acute fracture or dislocation. IMPRESSION: Degenerative change, without acute osseous finding. Electronically Signed   By: Jeronimo Greaves M.D.   On: 03/18/2023 14:58    Procedures .Joint Aspiration/Arthrocentesis  Date/Time: 03/20/2023 6:00 AM  Performed by: Gilda Crease, MD Authorized by: Gilda Crease, MD   Consent:    Consent obtained:  Verbal   Consent given by:  Patient   Risks, benefits, and alternatives were discussed: yes     Risks discussed:  Bleeding, infection and pain   Alternatives discussed:  Alternative treatment Universal protocol:    Procedure explained and questions answered to patient or proxy's satisfaction: yes     Relevant documents present and verified: yes     Test results available: yes     Imaging studies available: yes     Required blood products, implants, devices, and special equipment available: yes     Site/side marked: yes     Immediately prior to procedure, a time out was called: yes     Patient identity confirmed:  Verbally with patient Location:    Location:  Shoulder   Shoulder:  R glenohumeral Anesthesia:    Anesthesia method:  Local infiltration   Local anesthetic:  Bupivacaine 0.5% w/o epi Procedure details:    Preparation: Patient was prepped and draped in usual sterile fashion     Needle gauge: 25.   Ultrasound guidance: no     Approach:  Anterior   Aspirate amount:  None   Steroid injected: yes     Specimen collected: no    Post-procedure details:    Dressing:  Adhesive bandage   Procedure completion:  Tolerated well, no immediate complications     Medications Ordered in ED Medications  methylPREDNISolone acetate (DEPO-MEDROL) injection 20 mg (20 mg Intra-articular Given by Other 03/20/23 0555)  bupivacaine(PF) (MARCAINE) 0.5 % injection 4 mL (4 mLs Intra-articular Given by Other 03/20/23 1610)    ED Course/ Medical Decision Making/ A&P                                 Medical Decision Making Risk Prescription drug management.   Patient  with right shoulder pain for more than a month.  He was seen 2 days ago and had an x-ray that showed arthritis, no acute findings.  Patient has normal full range of motion at the shoulder.  No concern for septic joint.  Offered steroid injection and after discussing risks and benefits, patient consented.  Performed without difficulty.        Final Clinical Impression(s) / ED Diagnoses Final diagnoses:  Acute pain of right shoulder    Rx / DC Orders ED Discharge Orders     None         Allaina Brotzman, Canary Brim, MD 03/20/23 [phone]

## 2023-03-20 NOTE — ED Triage Notes (Signed)
Pt here for R shoulder pain, states that it hurts when he raises his arm up x 1.5 months. States he was seen for the same yesterday and was told it was muscular pain but he is still having pain. Denies new injury.

## 2023-04-15 ENCOUNTER — Other Ambulatory Visit: Payer: Self-pay

## 2023-04-15 ENCOUNTER — Emergency Department (HOSPITAL_COMMUNITY)
Admission: EM | Admit: 2023-04-15 | Discharge: 2023-04-16 | Disposition: A | Discharge status: Home / Self Care | Payer: Medicaid Other | Attending: Emergency Medicine | Admitting: Emergency Medicine

## 2023-04-15 ENCOUNTER — Emergency Department (HOSPITAL_COMMUNITY): Payer: Medicaid Other

## 2023-04-15 DIAGNOSIS — I10 Essential (primary) hypertension: Secondary | ICD-10-CM | POA: Insufficient documentation

## 2023-04-15 DIAGNOSIS — G8929 Other chronic pain: Secondary | ICD-10-CM | POA: Diagnosis not present

## 2023-04-15 DIAGNOSIS — M25552 Pain in left hip: Secondary | ICD-10-CM | POA: Diagnosis not present

## 2023-04-15 DIAGNOSIS — Z79899 Other long term (current) drug therapy: Secondary | ICD-10-CM | POA: Diagnosis not present

## 2023-04-15 DIAGNOSIS — R7303 Prediabetes: Secondary | ICD-10-CM | POA: Diagnosis not present

## 2023-04-15 DIAGNOSIS — I2510 Atherosclerotic heart disease of native coronary artery without angina pectoris: Secondary | ICD-10-CM | POA: Insufficient documentation

## 2023-04-15 DIAGNOSIS — M1612 Unilateral primary osteoarthritis, left hip: Secondary | ICD-10-CM | POA: Insufficient documentation

## 2023-04-15 DIAGNOSIS — Z125 Encounter for screening for malignant neoplasm of prostate: Secondary | ICD-10-CM | POA: Diagnosis not present

## 2023-04-15 DIAGNOSIS — M1610 Unilateral primary osteoarthritis, unspecified hip: Secondary | ICD-10-CM

## 2023-04-15 DIAGNOSIS — Z7982 Long term (current) use of aspirin: Secondary | ICD-10-CM | POA: Diagnosis not present

## 2023-04-15 DIAGNOSIS — E559 Vitamin D deficiency, unspecified: Secondary | ICD-10-CM | POA: Diagnosis not present

## 2023-04-15 MED ORDER — HYDROCODONE-ACETAMINOPHEN 5-325 MG PO TABS
1.0000 | ORAL_TABLET | Freq: Once | ORAL | Status: AC
Start: 2023-04-15 — End: 2023-04-15
  Administered 2023-04-15: 1 via ORAL
  Filled 2023-04-15: qty 1

## 2023-04-15 NOTE — ED Triage Notes (Signed)
 Patient reports chronic left hip pain for several years , denies recent injury /ambulatory using cane .

## 2023-04-15 NOTE — ED Provider Triage Note (Signed)
 Emergency Medicine Provider Triage Evaluation Note  Kevin Barton , a 63 y.o. male  was evaluated in triage.  Pt complains of left hip pain.  History of chronic left hip pain.  Denies new trauma to the head.  States the pain is worse, more so than usual today.  Still able to ambulate and bear weight with a walking cane.  Denies fever.  Review of Systems  Positive: See above Negative: See above  Physical Exam  BP 124/82 (BP Location: Right Arm)   Pulse 79   Temp 98.2 F (36.8 C) (Oral)   Resp 16   SpO2 97%  Gen:   Awake, no distress   Resp:  Normal effort  MSK:   Moves extremities without difficulty  Other:    Medical Decision Making  Medically screening exam initiated at 8:11 PM.  Appropriate orders placed.  Kevin Barton was informed that the remainder of the evaluation will be completed by another provider, this initial triage assessment does not replace that evaluation, and the importance of remaining in the ED until their evaluation is complete.  Work up started   Gareth Eagle, Cordelia Poche 04/15/23 2011

## 2023-04-16 NOTE — ED Provider Notes (Signed)
 [place] EMERGENCY DEPARTMENT AT [facility] Provider Note   CSN: [id number] Arrival date & time: 04/15/23  1912     History  Chief Complaint  Patient presents with   Left Hip Pain     Kevin Barton is a 63 y.o. male with past medical history significant for hypertension, CAD, NSTEMI, history of substance abuse who presents with concern for acute on chronic left hip pain for several years.  Denies any recent injury.  He reports he is ambulatory with a cane but that is worse recently.  He knows that he needs a knee replacement on the right, but he has not talked to the orthopedic physician about hip replacement in the past.  HPI     Home Medications Prior to Admission medications   Medication Sig Start Date End Date Taking? Authorizing Provider  amLODipine (NORVASC) 5 MG tablet Take 1 tablet (5 mg total) by mouth daily. 11/14/21   Tonny Bollman, MD  aspirin EC 81 MG tablet Take 1 tablet (81 mg total) by mouth 2 (two) times daily. To be taken after surgery to prevent blood clots if not on any other blood thinners Patient not taking: Reported on 03/18/2023 12/19/22 12/19/23  Cristie Hem, PA-C  atorvastatin (LIPITOR) 80 MG tablet Take 1 tablet (80 mg total) by mouth daily at 6 PM. 11/14/21   Tonny Bollman, MD  BAYER LOW DOSE 81 MG tablet Take 81 mg by mouth daily. Swallow whole.    [provider]  BRILINTA 90 MG TABS tablet Take 90 mg by mouth in the morning.    [provider]  docusate sodium (COLACE) 100 MG capsule Take 1 capsule (100 mg total) by mouth daily as needed. Patient not taking: Reported on 03/18/2023 12/19/22 12/19/23  Cristie Hem, PA-C  meloxicam (MOBIC) 15 MG tablet Take 1 tablet (15 mg total) by mouth daily. Patient not taking: Reported on 03/18/2023 03/18/23 03/17/24  Elson Areas, PA-C  methocarbamol (ROBAXIN-750) 750 MG tablet Take 1 tablet (750 mg total) by mouth 2 (two) times daily as needed for muscle spasms. Patient not  taking: Reported on 03/18/2023 12/19/22   Cristie Hem, PA-C  ondansetron (ZOFRAN) 4 MG tablet Take 1 tablet (4 mg total) by mouth every 8 (eight) hours as needed for nausea or vomiting. Patient not taking: Reported on 03/18/2023 12/19/22   Cristie Hem, PA-C  oxyCODONE-acetaminophen (PERCOCET) 5-325 MG tablet Take 1-2 tablets by mouth every 8 (eight) hours as needed. Patient not taking: Reported on 03/18/2023 12/19/22   Cristie Hem, PA-C  Vitamin D, Ergocalciferol, (DRISDOL) 1.25 MG (50000 UNIT) CAPS capsule Take 50,000 Units by mouth once a week. 10/01/22   [provider]      Allergies    Patient has no known allergies.    Review of Systems   Review of Systems  All other systems reviewed and are negative.   Physical Exam Updated Vital Signs BP 124/82 (BP Location: Right Arm)   Pulse 79   Temp 98.2 F (36.8 C) (Oral)   Resp 16   SpO2 97%  Physical Exam Vitals and nursing note reviewed.  Constitutional:      General: He is not in acute distress.    Appearance: Normal appearance.  HENT:     Head: Normocephalic and atraumatic.  Eyes:     General:        Right eye: No discharge.        Left eye: No discharge.  Cardiovascular:     Rate and Rhythm: Normal rate and regular rhythm.     Pulses: Normal pulses.     Heart sounds: No murmur heard.    No friction rub. No gallop.  Pulmonary:     Effort: Pulmonary effort is normal.     Breath sounds: Normal breath sounds.  Abdominal:     General: Bowel sounds are normal.     Palpations: Abdomen is soft.  Musculoskeletal:     Comments: Focal tenderness to palpation over the left hip, but intact range of motion to flexion, extension on the left, slightly decreased secondary to pain.  Ambulatory with his cane however.  Skin:    General: Skin is warm and dry.     Capillary Refill: Capillary refill takes less than 2 seconds.  Neurological:     Mental Status: He is alert and oriented to person, place, and time.   Psychiatric:        Mood and Affect: Mood normal.        Behavior: Behavior normal.     ED Results / Procedures / Treatments   Labs (all labs ordered are listed, but only abnormal results are displayed) Labs Reviewed - No data to display  EKG None  Radiology DG Hip Unilat With Pelvis 2-3 Views Left Result Date: 04/15/2023 CLINICAL DATA:  Chronic left hip pain. EXAM: DG HIP (WITH OR WITHOUT PELVIS) 2-3V LEFT COMPARISON:  None Available. FINDINGS: Advanced left hip arthropathy. Complete joint space loss in the superior acetabulum with subchondral cystic change and sclerosis. Possible acetabular geode. There may be an underlying element of avascular necrosis with slight flattening of the femoral head. No evidence of fracture. Mild right hip osteoarthritis with joint space narrowing and spurring. Pubic symphysis and sacroiliac joints are congruent. IMPRESSION: 1. Advanced left hip arthropathy with complete joint space loss in the superior acetabulum. Possible underlying element of avascular necrosis with slight flattening of the femoral head. 2. Mild right hip osteoarthritis. Electronically Signed   By: Narda Rutherford M.D.   On: 04/15/2023 21:30    Procedures Procedures    Medications Ordered in ED Medications  HYDROcodone-acetaminophen (NORCO/VICODIN) 5-325 MG per tablet 1 tablet (1 tablet Oral Given 04/15/23 2016)    ED Course/ Medical Decision Making/ A&P                                 Medical Decision Making  This patient is a 63 y.o. male who presents to the ED for concern of left hip pain without fall.   Differential diagnoses prior to evaluation: Arthritis, other musculoskeletal injury of the hip, low back  Past Medical History / Social History / Additional history: Chart reviewed. Pertinent results include:  hypertension, CAD, NSTEMI, history of substance abuse   Physical Exam: Physical exam performed. The pertinent findings include: Neurovascularly intact left  lower extremity.  He has focal tenderness to palpation over the left great trochanter.  Decreased strength secondary to pain but intact range of motion to flexion, extension at the left hip.  Intact ambulation with cane.  I independently interpreted plain film radiograph of the left hip which shows severe osteoarthritis possible developing avascular necrosis on the left.  Medications / Treatment: Recommend close follow-up with orthopedic surgery to discuss hip replacement, will place on steroid burst for arthritis exacerbation, discussed extensive return precautions   Disposition: After consideration of the diagnostic results and the patients response to treatment,  I feel that patient is stable for discharge with neurovascularly intact chronic left hip pain, findings concerning for worsening osteoarthritis versus developing avascular necrosis on left, encouraged urgent orthopedic follow-up.   emergency department workup does not suggest an emergent condition requiring admission or immediate intervention beyond what has been performed at this time. The plan is: as above. The patient is safe for discharge and has been instructed to return immediately for worsening symptoms, change in symptoms or any other concerns.  Final Clinical Impression(s) / ED Diagnoses Final diagnoses:  Hip arthritis    Rx / DC Orders ED Discharge Orders     None         [place] 04/16/23 0138    Tilden Fossa, MD 04/16/23 [phone]

## 2023-04-16 NOTE — ED Notes (Signed)
 Discharge instructions reviewed with patient. Patient questions answered and opportunity for education reviewed. Pt states, I don't want any of those steroids, I will just contact Dr. Roda Shutters. Patient voices understanding of discharge instructions with no further questions. Patient ambulatory with steady gait to lobby.

## 2023-04-16 NOTE — Discharge Instructions (Addendum)
 Please use Tylenol or ibuprofen for pain.  You may use 600 mg ibuprofen every 6 hours or 1000 mg of Tylenol every 6 hours.  You may choose to alternate between the 2.  This would be most effective.  Not to exceed 4 g of Tylenol within 24 hours.  Not to exceed 3200 mg ibuprofen 24 hours.  Please take the entire course of steroids that I prescribed, and contact the orthopedic physician's contact formation provided above for follow-up on discussing possible hip replacement in the future.

## 2023-04-29 ENCOUNTER — Emergency Department (HOSPITAL_COMMUNITY)
Admission: EM | Admit: 2023-04-29 | Discharge: 2023-04-30 | Discharge status: Against Medical Advice | Attending: Emergency Medicine | Admitting: Emergency Medicine

## 2023-04-29 ENCOUNTER — Other Ambulatory Visit: Payer: Self-pay

## 2023-04-29 DIAGNOSIS — I10 Essential (primary) hypertension: Secondary | ICD-10-CM | POA: Diagnosis not present

## 2023-04-29 DIAGNOSIS — M1612 Unilateral primary osteoarthritis, left hip: Secondary | ICD-10-CM | POA: Diagnosis not present

## 2023-04-29 DIAGNOSIS — G8929 Other chronic pain: Secondary | ICD-10-CM | POA: Diagnosis not present

## 2023-04-29 DIAGNOSIS — I2510 Atherosclerotic heart disease of native coronary artery without angina pectoris: Secondary | ICD-10-CM | POA: Diagnosis not present

## 2023-04-29 DIAGNOSIS — F17210 Nicotine dependence, cigarettes, uncomplicated: Secondary | ICD-10-CM | POA: Diagnosis not present

## 2023-04-29 DIAGNOSIS — M25552 Pain in left hip: Secondary | ICD-10-CM | POA: Diagnosis not present

## 2023-04-29 DIAGNOSIS — Z5321 Procedure and treatment not carried out due to patient leaving prior to being seen by health care provider: Secondary | ICD-10-CM | POA: Diagnosis not present

## 2023-04-29 DIAGNOSIS — M160 Bilateral primary osteoarthritis of hip: Secondary | ICD-10-CM | POA: Diagnosis not present

## 2023-04-29 DIAGNOSIS — Z8673 Personal history of transient ischemic attack (TIA), and cerebral infarction without residual deficits: Secondary | ICD-10-CM | POA: Diagnosis not present

## 2023-04-29 NOTE — ED Triage Notes (Signed)
 Patient reports persistent ( chronic 3 years )  pain at left hip radiating to left leg worse when ambulating.

## 2023-04-30 ENCOUNTER — Emergency Department (HOSPITAL_COMMUNITY)
Admission: EM | Admit: 2023-04-30 | Discharge: 2023-05-01 | Disposition: A | Discharge status: Home / Self Care | Source: Home / Self Care | Attending: Emergency Medicine | Admitting: Emergency Medicine

## 2023-04-30 ENCOUNTER — Encounter (HOSPITAL_COMMUNITY): Payer: Self-pay | Admitting: Emergency Medicine

## 2023-04-30 ENCOUNTER — Emergency Department (HOSPITAL_COMMUNITY)

## 2023-04-30 DIAGNOSIS — M25552 Pain in left hip: Secondary | ICD-10-CM | POA: Insufficient documentation

## 2023-04-30 DIAGNOSIS — F17210 Nicotine dependence, cigarettes, uncomplicated: Secondary | ICD-10-CM | POA: Insufficient documentation

## 2023-04-30 DIAGNOSIS — M160 Bilateral primary osteoarthritis of hip: Secondary | ICD-10-CM | POA: Diagnosis not present

## 2023-04-30 DIAGNOSIS — Z8673 Personal history of transient ischemic attack (TIA), and cerebral infarction without residual deficits: Secondary | ICD-10-CM | POA: Insufficient documentation

## 2023-04-30 DIAGNOSIS — I10 Essential (primary) hypertension: Secondary | ICD-10-CM | POA: Insufficient documentation

## 2023-04-30 DIAGNOSIS — I2510 Atherosclerotic heart disease of native coronary artery without angina pectoris: Secondary | ICD-10-CM | POA: Insufficient documentation

## 2023-04-30 DIAGNOSIS — M1612 Unilateral primary osteoarthritis, left hip: Secondary | ICD-10-CM | POA: Diagnosis not present

## 2023-04-30 DIAGNOSIS — G8929 Other chronic pain: Secondary | ICD-10-CM | POA: Diagnosis not present

## 2023-04-30 MED ORDER — OXYCODONE-ACETAMINOPHEN 5-325 MG PO TABS
1.0000 | ORAL_TABLET | ORAL | Status: DC | PRN
  Administered 2023-04-30: 1 via ORAL
  Filled 2023-04-30: qty 1

## 2023-04-30 NOTE — ED Triage Notes (Signed)
 Chronic left hip pain. Denies any injury or trauma. States pain radiates down leg and "locks up."

## 2023-04-30 NOTE — ED Notes (Signed)
 Pt called to be roomed multiple times with no answer

## 2023-05-01 MED ORDER — LIDOCAINE 5 % EX PTCH: 1.0000 | MEDICATED_PATCH | CUTANEOUS | 0 refills | Status: DC

## 2023-05-01 MED ORDER — NAPROXEN 500 MG PO TABS: 500.0000 mg | ORAL_TABLET | Freq: Two times a day (BID) | ORAL | 0 refills | Status: DC

## 2023-05-01 NOTE — ED Provider Notes (Signed)
 [facility]-EMERGENCY DEPT [facility] Emergency Department Provider Note MRN:  [id number]  Arrival date & time: 05/01/23     Chief Complaint   Hip Pain   History of Present Illness   Kevin Barton is a 63 y.o. year-old male with a history of CAD presenting to the ED with chief complaint of hip pain.  Acute on chronic left hip pain that flared up about a week ago.  Denies any trauma.  No numbness or weakness to the arms or legs, no bowel or bladder dysfunction, no back pain.  Has had a long history of pain in his hip.  Review of Systems  A thorough review of systems was obtained and all systems are negative except as noted in the HPI and PMH.   Patient's Health History    Past Medical History:  Diagnosis Date   Arthritis    CAD (coronary artery disease)    cath 06/2016 occlusion of RCA with collateralization from the left coronary artery. Mild nonobstructive plaque in the left main, LAD, and LCx. LVEF 50%. TX Rx.    Chronic back pain    Chronic knee pain    HTN (hypertension)    Hyperlipidemia    NSTEMI (non-ST elevated myocardial infarction) (HCC)    Stroke (HCC)    Substance abuse (HCC)    Tobacco abuse     Past Surgical History:  Procedure Laterality Date   IR ANGIO INTRA EXTRACRAN SEL COM CAROTID INNOMINATE UNI L MOD SED  10/05/2017   IR ANGIO VERTEBRAL SEL SUBCLAVIAN INNOMINATE UNI R MOD SED  10/05/2017   IR CT HEAD LTD  10/05/2017   IR INTRAVSC STENT CERV CAROTID W/O EMB-PROT MOD SED INC ANGIO  10/05/2017   IR PERCUTANEOUS ART THROMBECTOMY/INFUSION INTRACRANIAL INC DIAG ANGIO  10/05/2017   LEFT HEART CATH AND CORONARY ANGIOGRAPHY N/A 07/25/2016   Procedure: Left Heart Cath and Coronary Angiography;  Surgeon: Tonny Bollman, MD;  Location: [facility] INVASIVE CV LAB;  Service: Cardiovascular;  Laterality: N/A;   RADIOLOGY WITH ANESTHESIA N/A 10/05/2017   Procedure: IR WITH ANESTHESIA CODE STROKE;  Surgeon: Julieanne Cotton, MD;  Location: [facility] OR;  Service: Radiology;  Laterality:  N/A;    Family History  Problem Relation Age of Onset   Diabetes Brother     Social History   Socioeconomic History   Marital status: Single    Spouse name: Not on file   Number of children: Not on file   Years of education: Not on file   Highest education level: Not on file  Occupational History   Not on file  Tobacco Use   Smoking status: Every Day    Current packs/day: 0.50    Types: Cigarettes   Smokeless tobacco: Never   Tobacco comments:    smoke 6 per week  Vaping Use   Vaping status: Never Used  Substance and Sexual Activity   Alcohol use: Yes    Comment: a beer a day   Drug use: Yes    Types: Marijuana   Sexual activity: Yes  Other Topics Concern   Not on file  Social History Narrative   Not on file   Social Drivers of Health   Financial Resource Strain: High Risk (09/16/2019)   Overall Financial Resource Strain (CARDIA)    Difficulty of Paying Living Expenses: Hard  Food Insecurity: Food Insecurity Present (09/16/2019)   Hunger Vital Sign    Worried About Running Out of Food in the Last Year: Sometimes true    Ran Out  of Food in the Last Year: Never true  Transportation Needs: No Transportation Needs (09/16/2019)   PRAPARE - Administrator, Civil Service (Medical): No    Lack of Transportation (Non-Medical): No  Physical Activity: Not on file  Stress: Not on file  Social Connections: Not on file  Intimate Partner Violence: Not on file     Physical Exam   Vitals:   04/30/23 1750 04/30/23 2209  BP: (!) 145/80 131/78  Pulse: 97 72  Resp: 18 16  Temp: 98.3 F (36.8 C) 98.2 F (36.8 C)  SpO2: 98% 98%    CONSTITUTIONAL: Well-appearing, NAD NEURO/PSYCH:  Alert and oriented x 3, no focal deficits EYES:  eyes equal and reactive ENT/NECK:  no LAD, no JVD CARDIO: Regular rate, well-perfused, normal S1 and S2 PULM:  CTAB no wheezing or rhonchi GI/GU:  non-distended, non-tender MSK/SPINE:  No gross deformities, no edema SKIN:  no rash,  atraumatic   *Additional and/or pertinent findings included in MDM below  Diagnostic and Interventional Summary    EKG Interpretation Date/Time:    Ventricular Rate:    PR Interval:    QRS Duration:    QT Interval:    QTC Calculation:   R Axis:      Text Interpretation:         Labs Reviewed - No data to display  CT Hip Left Wo Contrast  Final Result    DG Hip Unilat W or Wo Pelvis 2-3 Views Left  Final Result      Medications  oxyCODONE-acetaminophen (PERCOCET/ROXICET) 5-325 MG per tablet 1 tablet (1 tablet Oral Given 04/30/23 1801)     Procedures  /  Critical Care Procedures  ED Course and Medical Decision Making  Initial Impression and Ddx Left hip pain acute on chronic, likely arthritis.  Preserved range of motion, neurovascularly intact, no other concerning features.  No swelling to suggest infection or DVT, doubt septic joint.  Past medical/surgical history that increases complexity of ED encounter: Arthritis  Interpretation of Diagnostics I personally reviewed the hip x-ray and my interpretation is as follows: Question avascular necrosis, obtaining follow-up CT which is without evidence of bony distraction, likely severe arthritis    Patient Reassessment and Ultimate Disposition/Management     Discharge  Patient management required discussion with the following services or consulting groups:  None  Complexity of Problems Addressed Acute illness or injury that poses threat of life of bodily function  Additional Data Reviewed and Analyzed Further history obtained from: Prior labs/imaging results  Additional Factors Impacting ED Encounter Risk Prescriptions  Elmer Sow. Pilar Plate, MD [facility] Emergency Medicine [facility] [email or web address]  Final Clinical Impressions(s) / ED Diagnoses     ICD-10-CM   1. Left hip pain  M25.552       ED Discharge Orders          Ordered    naproxen (NAPROSYN) 500 MG tablet  2 times daily         05/01/23 0200    lidocaine (LIDODERM) 5 %  Every 24 hours        05/01/23 0200             Discharge Instructions Discussed with and Provided to Patient:    Discharge Instructions      You were evaluated in the Emergency Department and after careful evaluation, we did not find any emergent condition requiring admission or further testing in the hospital.  Your exam/testing today was overall reassuring.  Symptoms likely due to significant arthritis of your hip.  Recommend follow-up with the orthopedic specialist for further management.  Can take the Naprosyn twice daily for pain, can use the numbing patches.  Please return to the Emergency Department if you experience any worsening of your condition.  Thank you for allowing [place] to be a part of your care.       Sabas Sous, MD 05/01/23 [phone]

## 2023-05-01 NOTE — Discharge Instructions (Signed)
 You were evaluated in the Emergency Department and after careful evaluation, we did not find any emergent condition requiring admission or further testing in the hospital.  Your exam/testing today was overall reassuring.  Symptoms likely due to significant arthritis of your hip.  Recommend follow-up with the orthopedic specialist for further management.  Can take the Naprosyn twice daily for pain, can use the numbing patches.  Please return to the Emergency Department if you experience any worsening of your condition.  Thank you for allowing [place] to be a part of your care.

## 2023-05-29 DIAGNOSIS — Z79899 Other long term (current) drug therapy: Secondary | ICD-10-CM | POA: Diagnosis not present

## 2023-06-05 ENCOUNTER — Emergency Department (HOSPITAL_COMMUNITY)
Admission: EM | Admit: 2023-06-05 | Discharge: 2023-06-06 | Disposition: A | Discharge status: Home / Self Care | Attending: Emergency Medicine | Admitting: Emergency Medicine

## 2023-06-05 ENCOUNTER — Encounter (HOSPITAL_COMMUNITY): Payer: Self-pay | Admitting: *Deleted

## 2023-06-05 ENCOUNTER — Other Ambulatory Visit: Payer: Self-pay

## 2023-06-05 DIAGNOSIS — I10 Essential (primary) hypertension: Secondary | ICD-10-CM | POA: Diagnosis not present

## 2023-06-05 DIAGNOSIS — M25552 Pain in left hip: Secondary | ICD-10-CM | POA: Diagnosis not present

## 2023-06-05 DIAGNOSIS — Z79899 Other long term (current) drug therapy: Secondary | ICD-10-CM | POA: Insufficient documentation

## 2023-06-05 DIAGNOSIS — Z7982 Long term (current) use of aspirin: Secondary | ICD-10-CM | POA: Diagnosis not present

## 2023-06-05 DIAGNOSIS — Z8673 Personal history of transient ischemic attack (TIA), and cerebral infarction without residual deficits: Secondary | ICD-10-CM | POA: Insufficient documentation

## 2023-06-05 DIAGNOSIS — I2510 Atherosclerotic heart disease of native coronary artery without angina pectoris: Secondary | ICD-10-CM | POA: Insufficient documentation

## 2023-06-05 NOTE — ED Triage Notes (Signed)
 The pt has lt hip pain for 2-3 years he needs a knee replacement on the lt aso  more pain today

## 2023-06-05 NOTE — ED Notes (Signed)
 Pt called x2 for repeat vitals with no answer

## 2023-06-06 MED ORDER — OXYCODONE-ACETAMINOPHEN 5-325 MG PO TABS
1.0000 | ORAL_TABLET | Freq: Once | ORAL | Status: AC
  Administered 2023-06-06: 1 via ORAL
  Filled 2023-06-06: qty 1

## 2023-06-06 MED ORDER — LIDOCAINE 5 % EX PTCH
1.0000 | MEDICATED_PATCH | CUTANEOUS | Status: DC
  Administered 2023-06-06: 1 via TRANSDERMAL
  Filled 2023-06-06: qty 1

## 2023-06-06 MED ORDER — LIDOCAINE 5 % EX PTCH: 1.0000 | MEDICATED_PATCH | CUTANEOUS | 0 refills | Status: DC

## 2023-06-06 MED ORDER — NAPROXEN 500 MG PO TABS: 500.0000 mg | ORAL_TABLET | Freq: Two times a day (BID) | ORAL | 0 refills | Status: DC

## 2023-06-06 MED ORDER — CYCLOBENZAPRINE HCL 10 MG PO TABS: 10.0000 mg | ORAL_TABLET | Freq: Two times a day (BID) | ORAL | 0 refills | Status: DC | PRN

## 2023-06-06 MED ORDER — NAPROXEN 250 MG PO TABS
500.0000 mg | ORAL_TABLET | Freq: Once | ORAL | Status: AC
  Administered 2023-06-06: 500 mg via ORAL
  Filled 2023-06-06: qty 2

## 2023-06-06 NOTE — ED Provider Notes (Signed)
 [place] EMERGENCY DEPARTMENT AT [facility] Provider Note   CSN: [id number] Arrival date & time: 06/05/23  1718     History  Chief Complaint  Patient presents with   Hip Pain    Kevin Barton is a 63 y.o. male with medical history of chronic back pain, chronic knee pain, hypertension, stroke, substance abuse, tobacco abuse, CAD, chronic left hip pain.  Patient presents to ED for evaluation of left hip pain.  He reports a longstanding history of left hip pain.  He states that he has been recently told that he needs a left hip replacement.  He states that he has not been taking prescribed naproxen at home.  He reports he was given this on last hospital visit, states it does not work.  He is requesting narcotic pain medication prescription.  He denies any fevers, nausea or vomiting at home.  Denies any trauma to his left hip to account for pain.  Patient recently had x-ray imaging of left hip, CT scan of left hip done 1 month ago.  Denies any new features to his left hip pain today.   Hip Pain       Home Medications Prior to Admission medications   Medication Sig Start Date End Date Taking? Authorizing Provider  cyclobenzaprine (FLEXERIL) 10 MG tablet Take 1 tablet (10 mg total) by mouth 2 (two) times daily as needed for muscle spasms. 06/06/23  Yes Delice Bison F, PA-C  lidocaine (LIDODERM) 5 % Place 1 patch onto the skin daily. Remove & Discard patch within 12 hours or as directed by MD 06/06/23  Yes Al Decant, PA-C  naproxen (NAPROSYN) 500 MG tablet Take 1 tablet (500 mg total) by mouth 2 (two) times daily. 06/06/23  Yes Al Decant, PA-C  amLODipine (NORVASC) 5 MG tablet Take 1 tablet (5 mg total) by mouth daily. 11/14/21   Tonny Bollman, MD  aspirin EC 81 MG tablet Take 1 tablet (81 mg total) by mouth 2 (two) times daily. To be taken after surgery to prevent blood clots if not on any other blood thinners Patient not taking: Reported on 03/18/2023  12/19/22 12/19/23  Cristie Hem, PA-C  atorvastatin (LIPITOR) 80 MG tablet Take 1 tablet (80 mg total) by mouth daily at 6 PM. 11/14/21   Tonny Bollman, MD  BAYER LOW DOSE 81 MG tablet Take 81 mg by mouth daily. Swallow whole.    [provider]  BRILINTA 90 MG TABS tablet Take 90 mg by mouth in the morning.    [provider]  docusate sodium (COLACE) 100 MG capsule Take 1 capsule (100 mg total) by mouth daily as needed. Patient not taking: Reported on 03/18/2023 12/19/22 12/19/23  Cristie Hem, PA-C  meloxicam (MOBIC) 15 MG tablet Take 1 tablet (15 mg total) by mouth daily. Patient not taking: Reported on 03/18/2023 03/18/23 03/17/24  Elson Areas, PA-C  methocarbamol (ROBAXIN-750) 750 MG tablet Take 1 tablet (750 mg total) by mouth 2 (two) times daily as needed for muscle spasms. Patient not taking: Reported on 03/18/2023 12/19/22   Cristie Hem, PA-C  ondansetron (ZOFRAN) 4 MG tablet Take 1 tablet (4 mg total) by mouth every 8 (eight) hours as needed for nausea or vomiting. Patient not taking: Reported on 03/18/2023 12/19/22   Cristie Hem, PA-C  oxyCODONE-acetaminophen (PERCOCET) 5-325 MG tablet Take 1-2 tablets by mouth every 8 (eight) hours as needed. Patient not taking: Reported on 03/18/2023 12/19/22   Jari Sportsman  L, PA-C  Vitamin D, Ergocalciferol, (DRISDOL) 1.25 MG (50000 UNIT) CAPS capsule Take 50,000 Units by mouth once a week. 10/01/22   [provider]      Allergies    Patient has no known allergies.    Review of Systems   Review of Systems  Musculoskeletal:  Positive for arthralgias.  All other systems reviewed and are negative.   Physical Exam Updated Vital Signs BP (!) 145/80 (BP Location: Left Arm)   Pulse (!) 57   Temp 97.9 F (36.6 C)   Resp 18   Ht 5\' 4"  (1.626 m)   Wt 78.5 kg   SpO2 100%   BMI 29.71 kg/m  Physical Exam Vitals and nursing note reviewed.  Constitutional:      General: He is not in acute  distress.    Appearance: He is well-developed.  HENT:     Head: Normocephalic and atraumatic.  Eyes:     Conjunctiva/sclera: Conjunctivae normal.  Cardiovascular:     Rate and Rhythm: Normal rate and regular rhythm.     Heart sounds: No murmur heard. Pulmonary:     Effort: Pulmonary effort is normal. No respiratory distress.     Breath sounds: Normal breath sounds.  Abdominal:     Palpations: Abdomen is soft.     Tenderness: There is no abdominal tenderness.  Musculoskeletal:        General: No swelling.     Cervical back: Neck supple.     Comments: Nonfocal tenderness to left hip.  Full ROM appreciated with actively and passively.  No deformity, no shortening or rotation of left lower extremity.  No overlying skin change, erythema or warmth.  Skin:    General: Skin is warm and dry.     Capillary Refill: Capillary refill takes less than 2 seconds.  Neurological:     Mental Status: He is alert.  Psychiatric:        Mood and Affect: Mood normal.     ED Results / Procedures / Treatments   Labs (all labs ordered are listed, but only abnormal results are displayed) Labs Reviewed - No data to display  EKG None  Radiology No results found.  Procedures Procedures   Medications Ordered in ED Medications  lidocaine (LIDODERM) 5 % 1 patch (1 patch Transdermal Patch Applied 06/06/23 0040)  oxyCODONE-acetaminophen (PERCOCET/ROXICET) 5-325 MG per tablet 1 tablet (1 tablet Oral Given 06/06/23 0039)  naproxen (NAPROSYN) tablet 500 mg (500 mg Oral Given 06/06/23 0040)    ED Course/ Medical Decision Making/ A&P  Medical Decision Making Risk Prescription drug management.   63 year old male presents for evaluation of chronic left hip pain.  Please see HPI for further details.  On examination patient is afebrile and nontachycardic.  His lung sounds are clear bilaterally, he is not hypoxic.  His abdomen is soft and compressible throughout.  His neurological examinations at baseline.   His left hip shows no overlying skin change, erythema, warmth.  He has full range of motion of his left hip with actively and passively.  He has reassuring vital signs.  Overall nontoxic in appearance.  Patient x-ray imaging, CT scan of left hip done 1 month ago.  He denies any new features to his pain, no symptoms that have changed.  I do not see any indication for repeat imaging at this time.  He has no pain full ranging of his left hip so I doubt septic arthritis.  He is not febrile, tachycardic, he has no overlying  skin change or warmth to the left hip.  He has full range of motion actively and passively without pain.  Suspect patient left hip pain secondary to chronic hip pain.  Patient given oxycodone here.  Will send patient with Toradol, Flexeril and lidocaine patches.  Have advised him to follow-up with orthopedics.  He voiced understanding.  Stable to discharge.   Final Clinical Impression(s) / ED Diagnoses Final diagnoses:  Left hip pain    Rx / DC Orders ED Discharge Orders          Ordered    naproxen (NAPROSYN) 500 MG tablet  2 times daily        06/06/23 0117    cyclobenzaprine (FLEXERIL) 10 MG tablet  2 times daily PRN        06/06/23 0117    lidocaine (LIDODERM) 5 %  Every 24 hours        06/06/23 0117              Al Decant, PA-C 06/06/23 0118    Nira Conn, MD 06/06/23 [phone]

## 2023-06-06 NOTE — Discharge Instructions (Signed)
 It was a pleasure taking part in your care.  As we discussed, your hip pain is most likely as a result of osteoarthritis, chronic left hip pain.  Please follow-up with orthopedics or your PCP.  Please take naproxen twice a day as needed for pain in the left hip.  Take muscle relaxer twice a day.  Apply lidocaine patch or Salonpas patch to left hip.  Salonpas patches are over-the-counter and can be purchased.  Return to the ED with any new or worsening symptoms.

## 2023-06-07 ENCOUNTER — Encounter (HOSPITAL_COMMUNITY): Payer: Self-pay | Admitting: *Deleted

## 2023-06-07 ENCOUNTER — Emergency Department (HOSPITAL_COMMUNITY)
Admission: EM | Admit: 2023-06-07 | Discharge: 2023-06-08 | Disposition: A | Discharge status: Home / Self Care | Attending: Emergency Medicine | Admitting: Emergency Medicine

## 2023-06-07 ENCOUNTER — Other Ambulatory Visit: Payer: Self-pay

## 2023-06-07 ENCOUNTER — Emergency Department (HOSPITAL_COMMUNITY)

## 2023-06-07 DIAGNOSIS — M25552 Pain in left hip: Secondary | ICD-10-CM | POA: Insufficient documentation

## 2023-06-07 DIAGNOSIS — M25562 Pain in left knee: Secondary | ICD-10-CM | POA: Diagnosis not present

## 2023-06-07 NOTE — ED Triage Notes (Cosign Needed)
 The pt is c/o lt knee pain since 1200n today  he has run out of his pain med

## 2023-06-08 MED ORDER — OXYCODONE-ACETAMINOPHEN 5-325 MG PO TABS
1.0000 | ORAL_TABLET | Freq: Once | ORAL | Status: AC
  Administered 2023-06-08: 1 via ORAL
  Filled 2023-06-08: qty 1

## 2023-06-08 MED ORDER — OXYCODONE-ACETAMINOPHEN 5-325 MG PO TABS: 1.0000 | ORAL_TABLET | Freq: Three times a day (TID) | ORAL | 0 refills | Status: DC | PRN

## 2023-06-08 MED ORDER — MELOXICAM 15 MG PO TABS: 15.0000 mg | ORAL_TABLET | Freq: Every day | ORAL | 0 refills | Status: DC

## 2023-06-08 NOTE — ED Provider Notes (Signed)
 [facility] Emergency Department Provider Note MRN:  [id number]  Arrival date & time: 06/08/23     Chief Complaint   Knee Pain   History of Present Illness   Kevin Barton is a 63 y.o. year-old male presents to the ED with chief complaint of left hip pain.  He states that this is not a new problem. He states that he is out of his pain medications.  States that he was told by his orthopedic doctor that he needs to have a hip replacement.  Denies any new symptoms.  History provided by patient.   Review of Systems  Pertinent positive and negative review of systems noted in HPI.    Physical Exam   Vitals:   06/08/23 0002 06/08/23 0105  BP: (!) 160/83 (!) 159/77  Pulse: 80 72  Resp: 18 18  Temp: 97.6 F (36.4 C) 97.6 F (36.4 C)  SpO2: 100% 100%    CONSTITUTIONAL:  well-appearing, NAD NEURO:  Alert and oriented x 3, CN 3-12 grossly intact EYES:  eyes equal and reactive ENT/NECK:  Supple, no stridor  CARDIO:  normal rate, regular rhythm, appears well-perfused  PULM:  No respiratory distress, CTAB GI/GU:  non-distended,  MSK/SPINE:  No gross deformities, no edema, moves all extremities  SKIN:  no rash, atraumatic   *Additional and/or pertinent findings included in MDM below  Diagnostic and Interventional Summary    EKG Interpretation Date/Time:    Ventricular Rate:    PR Interval:    QRS Duration:    QT Interval:    QTC Calculation:   R Axis:      Text Interpretation:         Labs Reviewed - No data to display  DG Knee Complete 4 Views Left  Final Result      Medications  oxyCODONE-acetaminophen (PERCOCET/ROXICET) 5-325 MG per tablet 1 tablet (1 tablet Oral Given 06/08/23 0102)     Procedures  /  Critical Care Procedures  ED Course and Medical Decision Making  I have reviewed the triage vital signs, the nursing notes, and pertinent available records from the EMR.  Social Determinants Affecting Complexity of Care: Patient  has no clinically significant social determinants affecting this chief complaint..   ED Course:    Medical Decision Making Patient here with acute on chronic left hip pain.  I reviewed his recent CT.  I think he would benefit from follow-up with ortho for consultation regarding hip replacement.  I'll give him a few pain pills to hold him over.  Risk Prescription drug management.         Consultants: No consultations were needed in caring for this patient.   Treatment and Plan: Emergency department workup does not suggest an emergent condition requiring admission or immediate intervention beyond  what has been performed at this time. The patient is safe for discharge and has  been instructed to return immediately for worsening symptoms, change in  symptoms or any other concerns    Final Clinical Impressions(s) / ED Diagnoses     ICD-10-CM   1. Left hip pain  M25.552       ED Discharge Orders          Ordered    oxyCODONE-acetaminophen (PERCOCET) 5-325 MG tablet  Every 8 hours PRN        06/08/23 0044    meloxicam (MOBIC) 15 MG tablet  Daily        06/08/23 0044  Discharge Instructions Discussed with and Provided to Patient:   Discharge Instructions   None      Sherel Dikes, PA-C 06/08/23 0125    Teddi Favors, DO 06/09/23 [phone]

## 2023-06-13 ENCOUNTER — Emergency Department (HOSPITAL_COMMUNITY)
Admission: EM | Admit: 2023-06-13 | Discharge: 2023-06-14 | Disposition: A | Discharge status: Home / Self Care | Attending: Emergency Medicine | Admitting: Emergency Medicine

## 2023-06-13 ENCOUNTER — Other Ambulatory Visit: Payer: Self-pay

## 2023-06-13 DIAGNOSIS — M25552 Pain in left hip: Secondary | ICD-10-CM | POA: Insufficient documentation

## 2023-06-13 DIAGNOSIS — G8929 Other chronic pain: Secondary | ICD-10-CM | POA: Diagnosis not present

## 2023-06-13 DIAGNOSIS — Z7982 Long term (current) use of aspirin: Secondary | ICD-10-CM | POA: Diagnosis not present

## 2023-06-13 MED ORDER — ACETAMINOPHEN 500 MG PO TABS
1000.0000 mg | ORAL_TABLET | Freq: Once | ORAL | Status: AC
  Administered 2023-06-13: 1000 mg via ORAL
  Filled 2023-06-13: qty 2

## 2023-06-13 NOTE — ED Provider Triage Note (Signed)
 Emergency Medicine Provider Triage Evaluation Note  Kevin Barton , a 63 y.o. male  was evaluated in triage.  Pt complains of left hip pain for a year and a half that has been progressively getting worse.  He is not able to ambulate without pain but uses a walker and has steady gait.  Has recent imaging for similar thing.  Review of Systems  Positive: Left hip pain Negative: fever  Physical Exam  BP (!) 167/90 (BP Location: Right Arm)   Pulse 86   Temp 98.4 F (36.9 C)   Resp 16   SpO2 100%  Gen:   Awake, no distress  Resp:  Normal effort  MSK:   Moves extremities without difficulty  Other:    Medical Decision Making  Medically screening exam initiated at 8:01 PM.  Appropriate orders placed.  Demetria Higham was informed that the remainder of the evaluation will be completed by another provider, this initial triage assessment does not replace that evaluation, and the importance of remaining in the ED until their evaluation is complete.     Eudora Heron, PA-C 06/13/23 2001

## 2023-06-13 NOTE — ED Triage Notes (Signed)
 Pt reports recurrent left hip pain over 1.5 years. Denies any recent injury/trauma. Ambulatory in triage with his personal cane. Has not been able to pick up his pain prescription.

## 2023-06-14 MED ORDER — OXYCODONE-ACETAMINOPHEN 5-325 MG PO TABS
1.0000 | ORAL_TABLET | Freq: Once | ORAL | Status: AC
  Administered 2023-06-14: 1 via ORAL
  Filled 2023-06-14: qty 1

## 2023-06-14 NOTE — ED Provider Notes (Signed)
 [place] EMERGENCY DEPARTMENT AT [facility] Provider Note   CSN: [id number] Arrival date & time: 06/13/23  1948     History  Chief Complaint  Patient presents with   Hip Pain    L    Kevin Barton is a 63 y.o. male, who presents to the ED secondary to chronic left hip pain, he states that he is hurting, and did not pick up his medications today.  States he is planning on picking them up in the morning, but would like a pain medicine now.  Denies any kind of numbness or tingling, or change in color of his leg.     Home Medications Prior to Admission medications   Medication Sig Start Date End Date Taking? Authorizing Provider  amLODipine  (NORVASC ) 5 MG tablet Take 1 tablet (5 mg total) by mouth daily. 11/14/21   Arnoldo Lapping, MD  aspirin  EC 81 MG tablet Take 1 tablet (81 mg total) by mouth 2 (two) times daily. To be taken after surgery to prevent blood clots if not on any other blood thinners Patient not taking: Reported on 03/18/2023 12/19/22 12/19/23  Sandie Cross, PA-C  atorvastatin  (LIPITOR ) 80 MG tablet Take 1 tablet (80 mg total) by mouth daily at 6 PM. 11/14/21   Arnoldo Lapping, MD  BAYER LOW DOSE 81 MG tablet Take 81 mg by mouth daily. Swallow whole.    [provider]  BRILINTA  90 MG TABS tablet Take 90 mg by mouth in the morning.    [provider]  cyclobenzaprine  (FLEXERIL ) 10 MG tablet Take 1 tablet (10 mg total) by mouth 2 (two) times daily as needed for muscle spasms. 06/06/23   Adel Aden, PA-C  docusate sodium  (COLACE) 100 MG capsule Take 1 capsule (100 mg total) by mouth daily as needed. Patient not taking: Reported on 03/18/2023 12/19/22 12/19/23  Sandie Cross, PA-C  lidocaine  (LIDODERM ) 5 % Place 1 patch onto the skin daily. Remove & Discard patch within 12 hours or as directed by MD 06/06/23   Adel Aden, PA-C  meloxicam  (MOBIC ) 15 MG tablet Take 1 tablet (15 mg total) by mouth daily. 06/08/23 06/07/24   Sherel Dikes, PA-C  methocarbamol  (ROBAXIN -750) 750 MG tablet Take 1 tablet (750 mg total) by mouth 2 (two) times daily as needed for muscle spasms. Patient not taking: Reported on 03/18/2023 12/19/22   Sandie Cross, PA-C  naproxen  (NAPROSYN ) 500 MG tablet Take 1 tablet (500 mg total) by mouth 2 (two) times daily. 06/06/23   Adel Aden, PA-C  ondansetron  (ZOFRAN ) 4 MG tablet Take 1 tablet (4 mg total) by mouth every 8 (eight) hours as needed for nausea or vomiting. Patient not taking: Reported on 03/18/2023 12/19/22   Sandie Cross, PA-C  oxyCODONE -acetaminophen  (PERCOCET) 5-325 MG tablet Take 1-2 tablets by mouth every 8 (eight) hours as needed. 06/08/23   Sherel Dikes, PA-C  Vitamin D, Ergocalciferol, (DRISDOL) 1.25 MG (50000 UNIT) CAPS capsule Take 50,000 Units by mouth once a week. 10/01/22   [provider]      Allergies    Patient has no known allergies.    Review of Systems   Review of Systems  Musculoskeletal:  Positive for arthralgias.  Skin:  Negative for wound.    Physical Exam Updated Vital Signs BP (!) 135/114   Pulse (!) 50   Temp 97.7 F (36.5 C) (Oral)   Resp 16   SpO2 100%  Physical Exam Vitals and nursing note  reviewed.  Constitutional:      General: He is not in acute distress.    Appearance: He is well-developed.  HENT:     Head: Normocephalic and atraumatic.  Eyes:     General:        Right eye: No discharge.        Left eye: No discharge.     Conjunctiva/sclera: Conjunctivae normal.  Pulmonary:     Effort: No respiratory distress.  Musculoskeletal:     Comments: Tenderness to palpation, of femoral head, the left hip, as well as the lateral hip.  Range of motion intact, positive dorsalis pedis pulse.  No rash.  Neurological:     Mental Status: He is alert.     Comments: Clear speech.   Psychiatric:        Behavior: Behavior normal.        Thought Content: Thought content normal.     ED Results / Procedures /  Treatments   Labs (all labs ordered are listed, but only abnormal results are displayed) Labs Reviewed - No data to display  EKG None  Radiology No results found.  Procedures Procedures    Medications Ordered in ED Medications  oxyCODONE -acetaminophen  (PERCOCET/ROXICET) 5-325 MG per tablet 1 tablet (has no administration in time range)  acetaminophen  (TYLENOL ) tablet 1,000 mg (1,000 mg Oral Given 06/13/23 2009)    ED Course/ Medical Decision Making/ A&P                                 Medical Decision Making Patient is here for chronic hip pain, had a CT done on 3/4, which shows severe degenerative disease.  He states that he is planning on getting it replaced, has not had any recent falls, or injuries.  I believe this likely represents his chronic pain, and was given dose of Percocet, and he states he will follow-up in the a.m., pick up his medication.  He was advised on strict return precautions and discharged home.  No evidence of any wounds, color changes, or changes in arterial flow.  No edema noted  Risk Prescription drug management.    Final Clinical Impression(s) / ED Diagnoses Final diagnoses:  Left hip pain  Other chronic pain    Rx / DC Orders ED Discharge Orders     None         Dacota Ruben, Dwaine Gip, PA 06/14/23 0207    Edson Graces, MD 06/14/23 [phone]

## 2023-06-14 NOTE — Discharge Instructions (Signed)
 I way that your hip pain, is likely from your chronic degenerative disease, please pick up your pain medications in the a.m., and follow-up with your primary care doctor.

## 2023-06-17 ENCOUNTER — Emergency Department (HOSPITAL_COMMUNITY)
Admission: EM | Admit: 2023-06-17 | Discharge: 2023-06-18 | Discharge status: Against Medical Advice | Attending: Emergency Medicine | Admitting: Emergency Medicine

## 2023-06-17 DIAGNOSIS — M25552 Pain in left hip: Secondary | ICD-10-CM | POA: Diagnosis present

## 2023-06-17 DIAGNOSIS — Z7982 Long term (current) use of aspirin: Secondary | ICD-10-CM | POA: Diagnosis not present

## 2023-06-17 DIAGNOSIS — M25551 Pain in right hip: Secondary | ICD-10-CM | POA: Insufficient documentation

## 2023-06-17 DIAGNOSIS — Z5321 Procedure and treatment not carried out due to patient leaving prior to being seen by health care provider: Secondary | ICD-10-CM | POA: Insufficient documentation

## 2023-06-17 DIAGNOSIS — M1611 Unilateral primary osteoarthritis, right hip: Secondary | ICD-10-CM | POA: Diagnosis not present

## 2023-06-17 DIAGNOSIS — M160 Bilateral primary osteoarthritis of hip: Secondary | ICD-10-CM | POA: Diagnosis not present

## 2023-06-17 DIAGNOSIS — Z720 Tobacco use: Secondary | ICD-10-CM | POA: Diagnosis not present

## 2023-06-17 DIAGNOSIS — Z8673 Personal history of transient ischemic attack (TIA), and cerebral infarction without residual deficits: Secondary | ICD-10-CM | POA: Diagnosis not present

## 2023-06-17 DIAGNOSIS — M79605 Pain in left leg: Secondary | ICD-10-CM | POA: Diagnosis not present

## 2023-06-17 NOTE — ED Triage Notes (Signed)
 Pt complains of right hip pain that radiates down front of right leg. Denies any injury. States pain so bad he couldn't sleep last night. No swelling noted.

## 2023-06-18 ENCOUNTER — Emergency Department (HOSPITAL_COMMUNITY)
Admission: EM | Admit: 2023-06-18 | Discharge: 2023-06-19 | Disposition: A | Discharge status: Home / Self Care | Source: Home / Self Care | Attending: Emergency Medicine | Admitting: Emergency Medicine

## 2023-06-18 ENCOUNTER — Other Ambulatory Visit: Payer: Self-pay

## 2023-06-18 ENCOUNTER — Emergency Department (HOSPITAL_COMMUNITY)

## 2023-06-18 DIAGNOSIS — Z7982 Long term (current) use of aspirin: Secondary | ICD-10-CM | POA: Insufficient documentation

## 2023-06-18 DIAGNOSIS — M160 Bilateral primary osteoarthritis of hip: Secondary | ICD-10-CM | POA: Insufficient documentation

## 2023-06-18 DIAGNOSIS — Z8673 Personal history of transient ischemic attack (TIA), and cerebral infarction without residual deficits: Secondary | ICD-10-CM | POA: Insufficient documentation

## 2023-06-18 DIAGNOSIS — M1611 Unilateral primary osteoarthritis, right hip: Secondary | ICD-10-CM | POA: Diagnosis not present

## 2023-06-18 DIAGNOSIS — M25551 Pain in right hip: Secondary | ICD-10-CM | POA: Diagnosis not present

## 2023-06-18 DIAGNOSIS — Z720 Tobacco use: Secondary | ICD-10-CM | POA: Insufficient documentation

## 2023-06-18 NOTE — ED Notes (Signed)
Pt name called for updated vitals, no response 

## 2023-06-18 NOTE — ED Triage Notes (Signed)
 Pt complains of right hip pain radiates down top of leg. Denies injury. Was seen yesterday for same.

## 2023-06-18 NOTE — ED Provider Triage Note (Signed)
 Emergency Medicine Provider Triage Evaluation Note  Kevin Barton , a 63 y.o. male  was evaluated in triage.  Pt complains of right hip pain that radiates down from the top of his right leg.  Reports pain feels similar to the pain he feels in his left hip.  Denies any injuries to the area.  Denies any weakness in the right leg.  Denies any bowel or bladder incontinence.  Review of Systems  Positive: As above Negative: As above  Physical Exam  BP (!) 140/72 (BP Location: Right Arm)   Pulse (!) 49   Temp 98.5 F (36.9 C)   Resp 16   Ht 5\' 4"  (1.626 m)   Wt 78 kg   SpO2 100%   BMI 29.52 kg/m  Gen:   Awake, no distress   Resp:  Normal effort  MSK:   Moves extremities without difficulty  Other:  5/5 strength in the bilateral lower extremities with hip flexion, knee flexion extension, ankle plantarflexion and dorsiflexion.  Intact sensation in the bilateral lower extremities.  No cervical, thoracic, or lumbar spinal tenderness to palpation  Medical Decision Making  Medically screening exam initiated at 6:21 PM.  Appropriate orders placed.  Kevin Barton was informed that the remainder of the evaluation will be completed by another provider, this initial triage assessment does not replace that evaluation, and the importance of remaining in the ED until their evaluation is complete.     Rexie Catena, PA-C 06/18/23 Jerre Moots

## 2023-06-19 ENCOUNTER — Other Ambulatory Visit (HOSPITAL_COMMUNITY): Payer: Self-pay

## 2023-06-19 DIAGNOSIS — Z5321 Procedure and treatment not carried out due to patient leaving prior to being seen by health care provider: Secondary | ICD-10-CM | POA: Diagnosis not present

## 2023-06-19 DIAGNOSIS — M79605 Pain in left leg: Secondary | ICD-10-CM | POA: Insufficient documentation

## 2023-06-19 MED ORDER — HYDROCODONE-ACETAMINOPHEN 5-325 MG PO TABS
1.0000 | ORAL_TABLET | Freq: Once | ORAL | Status: AC
  Administered 2023-06-19: 1 via ORAL
  Filled 2023-06-19: qty 1

## 2023-06-19 MED ORDER — MELOXICAM 15 MG PO TABS
15.0000 mg | ORAL_TABLET | Freq: Every day | ORAL | 0 refills | Status: AC
  Filled 2023-06-19: qty 30, 30d supply, fill #0

## 2023-06-19 NOTE — Discharge Planning (Signed)
 RNCM consulted in regards to medication assistance.  Pt has Medicaid insurance coverage and is not eligible for Medication Assistance Through [facility]) program. RNCM suggests sending Rx to [facility] at  [facility]  to fill and bring to pt at bedside prior to discharge. No further CM needs communicated at this time.

## 2023-06-19 NOTE — Discharge Instructions (Addendum)
 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  You will need to get your hip replaced.  Follow-up with your orthopedic provider to schedule an appointment.  Get help right away if: Your pain suddenly becomes very bad.

## 2023-06-19 NOTE — ED Provider Notes (Signed)
 [place] EMERGENCY DEPARTMENT AT [facility] Provider Note   CSN: [id number] Arrival date & time: 06/18/23  1720     History  Chief Complaint  Patient presents with  . Leg Pain    Kevin Barton is a 63 y.o. male with a history of CVA, NSTEMI, and polysubstance abuse who presents to the ED today for hip pain.  Patient endorses bilateral hip pain that is been going on for "many months."  States that pain goes from the lateral to anterior hips bilaterally, then down the thighs.  He is being seen by Ortho care for this.  States that he is supposed to have hip replacement but his procedure recently got canceled.  States that he has been taking OTC analgesics without improvement of symptoms.  Patient reports that he is on a fixed income and is unable to pick up the oxycodone  that he was prescribed until next week.  No numbness, tingling or weakness of the lower extremities.  He has been seen multiple times for the same complaint in the past month, denies any new or worsening pain since then.    Home Medications Prior to Admission medications   Medication Sig Start Date End Date Taking? Authorizing Provider  amLODipine  (NORVASC ) 5 MG tablet Take 1 tablet (5 mg total) by mouth daily. 11/14/21   Arnoldo Lapping, MD  aspirin  EC 81 MG tablet Take 1 tablet (81 mg total) by mouth 2 (two) times daily. To be taken after surgery to prevent blood clots if not on any other blood thinners Patient not taking: Reported on 03/18/2023 12/19/22 12/19/23  Sandie Cross, PA-C  atorvastatin  (LIPITOR ) 80 MG tablet Take 1 tablet (80 mg total) by mouth daily at 6 PM. 11/14/21   Arnoldo Lapping, MD  BAYER LOW DOSE 81 MG tablet Take 81 mg by mouth daily. Swallow whole.    [provider]  BRILINTA  90 MG TABS tablet Take 90 mg by mouth in the morning.    [provider]  cyclobenzaprine  (FLEXERIL ) 10 MG tablet Take 1 tablet (10 mg total) by mouth 2 (two) times daily as needed for muscle  spasms. 06/06/23   Adel Aden, PA-C  docusate sodium  (COLACE) 100 MG capsule Take 1 capsule (100 mg total) by mouth daily as needed. Patient not taking: Reported on 03/18/2023 12/19/22 12/19/23  Sandie Cross, PA-C  lidocaine  (LIDODERM ) 5 % Place 1 patch onto the skin daily. Remove & Discard patch within 12 hours or as directed by MD 06/06/23   Adel Aden, PA-C  meloxicam  (MOBIC ) 15 MG tablet Take 1 tablet (15 mg total) by mouth daily. 06/19/23 07/19/23  Sonnie Dusky, PA-C  methocarbamol  (ROBAXIN -750) 750 MG tablet Take 1 tablet (750 mg total) by mouth 2 (two) times daily as needed for muscle spasms. Patient not taking: Reported on 03/18/2023 12/19/22   Sandie Cross, PA-C  naproxen  (NAPROSYN ) 500 MG tablet Take 1 tablet (500 mg total) by mouth 2 (two) times daily. 06/06/23   Adel Aden, PA-C  ondansetron  (ZOFRAN ) 4 MG tablet Take 1 tablet (4 mg total) by mouth every 8 (eight) hours as needed for nausea or vomiting. Patient not taking: Reported on 03/18/2023 12/19/22   Sandie Cross, PA-C  oxyCODONE -acetaminophen  (PERCOCET) 5-325 MG tablet Take 1-2 tablets by mouth every 8 (eight) hours as needed. 06/08/23   Sherel Dikes, PA-C  Vitamin D, Ergocalciferol, (DRISDOL) 1.25 MG (50000 UNIT) CAPS capsule Take 50,000 Units by mouth once a week. 10/01/22  [provider]      Allergies    Patient has no known allergies.    Review of Systems   Review of Systems  Musculoskeletal:  Positive for arthralgias.  All other systems reviewed and are negative.   Physical Exam Updated Vital Signs BP 138/87 (BP Location: Right Arm)   Pulse 69   Temp 97.7 F (36.5 C) (Oral)   Resp 12   Ht 5\' 4"  (1.626 m)   Wt 78 kg   SpO2 99%   BMI 29.52 kg/m  Physical Exam Vitals and nursing note reviewed.  Constitutional:      General: He is not in acute distress.    Appearance: Normal appearance.  HENT:     Head: Normocephalic and atraumatic.     Mouth/Throat:      Mouth: Mucous membranes are moist.  Eyes:     Conjunctiva/sclera: Conjunctivae normal.     Pupils: Pupils are equal, round, and reactive to light.  Cardiovascular:     Rate and Rhythm: Normal rate and regular rhythm.     Pulses: Normal pulses.     Heart sounds: Normal heart sounds.  Pulmonary:     Effort: Pulmonary effort is normal.     Breath sounds: Normal breath sounds.  Abdominal:     Palpations: Abdomen is soft.     Tenderness: There is no abdominal tenderness.  Musculoskeletal:        General: Tenderness present. Normal range of motion.     Right lower leg: No edema.     Left lower leg: No edema.     Comments: TTP of bilateral hips at the lateral and anterior aspects.  Range of motion, strength, and sensation of lower extremities intact bilaterally.  Skin:    General: Skin is warm and dry.     Findings: No rash.  Neurological:     General: No focal deficit present.     Mental Status: He is alert.  Psychiatric:        Mood and Affect: Mood normal.        Behavior: Behavior normal.   ED Results / Procedures / Treatments   Labs (all labs ordered are listed, but only abnormal results are displayed) Labs Reviewed - No data to display  EKG None  Radiology DG Hip Unilat W or Wo Pelvis 2-3 Views Right Result Date: 06/18/2023 CLINICAL DATA:  Right-sided hip pain EXAM: DG HIP (WITH OR WITHOUT PELVIS) 2-3V RIGHT COMPARISON:  04/30/2023, 04/15/2023 FINDINGS: SI joints are non widened. Pubic symphysis and rami appear intact. Severe arthritis of left hip with bone on bone appearance, subarticular cyst formation and sclerosis. Possible mild AVN type changes of the left femoral head without progressive collapse. Moderate right hip arthritis without significant change. Possible curvilinear sclerosis in the right femoral head on the frog-leg lateral view IMPRESSION: Severe left and moderate right hip arthritis. Possible mild AVN type changes of the left femoral head without progressive  collapse. Possible curvilinear sclerosis in the subarticular right femoral head on lateral view as may be seen with avascular necrosis Electronically Signed   By: Esmeralda Hedge M.D.   On: 06/18/2023 19:33    Procedures Procedures: not indicated.   Medications Ordered in ED Medications  HYDROcodone -acetaminophen  (NORCO/VICODIN) 5-325 MG per tablet 1 tablet (1 tablet Oral Given 06/19/23 1610)    ED Course/ Medical Decision Making/ A&P  Medical Decision Making Risk Prescription drug management.   This patient presents to the ED for concern of hip pain, this involves an extensive number of treatment options, and is a complaint that carries with it a high risk of complications and morbidity.   Differential diagnosis includes: Fracture, dislocation, arthritic changes, etc.   Comorbidities  See HPI above   Additional History  Additional history obtained from prior ED records   Imaging Studies  I ordered imaging studies including right hip with pelvis x-ray I independently visualized and interpreted imaging which showed:  Severe left hip and moderate right hip arthritis. Possible mild AVN type changes  I agree with the radiologist interpretation   Consultations  I requested consultation with Starr Eddy PA-C with orthopedics,  and discussed lab and imaging findings as well as pertinent plan - they recommend: Outpatient follow-up with orthopedics.  He will need to have his hip replaced ultimately. I requested consultation with social work due patient's inability to afford medications,  and discussed lab and imaging findings as well as pertinent plan - they recommend: can send medications to [facility] for patient and will go towards his ED bill. They will not fill pain medications unless it's for trauma.   Problem List / ED Course / Critical Interventions / Medication Management  Patient reports chronic pain to his bilateral hips, with  left hip pain worse than right.  States that this been going on for months.  Has been seen multiple times for the same complaint.  Denies any injury or trauma prior to onset of pain. States that he has not been able to pick up his pain medication from the pharmacy for financial reasons.  States that he has been taking over-the-counter analgesics without improvement of symptoms. Patient states he's suppose to have a hip replacement through [facility].  Procedure was scheduled previously but had to be canceled. I ordered medications including: Norco for pain  Reevaluation of the patient after these medicines showed that the patient improved I have reviewed the patients home medicines and have made adjustments as needed   Social Determinants of Health  Tobacco use   Test / Admission - Considered  Discussed findings with patient.  All questions were answered.  Patient instructed to follow-up with his orthopedist/reschedule hip placement. He is stable and safe for discharge home.  Return precautions given.        Final Clinical Impression(s) / ED Diagnoses Final diagnoses:  Bilateral hip joint arthritis    Rx / DC Orders ED Discharge Orders          Ordered    meloxicam  (MOBIC ) 15 MG tablet  Daily        06/19/23 1230              Sonnie Dusky, PA-C 06/19/23 1517    Merdis Stalling, MD 06/19/23 [phone]

## 2023-06-20 ENCOUNTER — Encounter (HOSPITAL_COMMUNITY): Payer: Self-pay | Admitting: *Deleted

## 2023-06-20 ENCOUNTER — Other Ambulatory Visit: Payer: Self-pay

## 2023-06-20 ENCOUNTER — Emergency Department (HOSPITAL_COMMUNITY)
Admission: EM | Admit: 2023-06-20 | Discharge: 2023-06-20 | Discharge status: Against Medical Advice | Attending: Emergency Medicine | Admitting: Emergency Medicine

## 2023-06-20 NOTE — ED Notes (Signed)
 Patient did not answer when called to check vitals.

## 2023-06-20 NOTE — ED Triage Notes (Signed)
 The pt is c/o lt leg pain since last Tuesday  he rep[orts that he has been here numerus times for the same

## 2023-07-04 ENCOUNTER — Other Ambulatory Visit: Payer: Self-pay

## 2023-07-04 ENCOUNTER — Emergency Department (HOSPITAL_COMMUNITY)
Admission: EM | Admit: 2023-07-04 | Discharge: 2023-07-05 | Discharge status: Against Medical Advice | Attending: Emergency Medicine | Admitting: Emergency Medicine

## 2023-07-04 ENCOUNTER — Encounter (HOSPITAL_COMMUNITY): Payer: Self-pay

## 2023-07-04 DIAGNOSIS — Z5321 Procedure and treatment not carried out due to patient leaving prior to being seen by health care provider: Secondary | ICD-10-CM | POA: Diagnosis not present

## 2023-07-04 DIAGNOSIS — M25552 Pain in left hip: Secondary | ICD-10-CM | POA: Insufficient documentation

## 2023-07-04 NOTE — ED Notes (Signed)
 Called pt x3, no answer.  KM

## 2023-07-04 NOTE — ED Triage Notes (Signed)
 Chronic left hip pain. Says he is on a restricted income and limited on prescriptions he can pick up.   Seeking pain control.

## 2023-07-05 NOTE — ED Notes (Signed)
 pT left AMA

## 2023-07-07 ENCOUNTER — Other Ambulatory Visit: Payer: Self-pay

## 2023-07-07 ENCOUNTER — Encounter (HOSPITAL_COMMUNITY): Payer: Self-pay | Admitting: *Deleted

## 2023-07-07 ENCOUNTER — Emergency Department (HOSPITAL_COMMUNITY)
Admission: EM | Admit: 2023-07-07 | Discharge: 2023-07-07 | Disposition: A | Discharge status: Home / Self Care | Attending: Emergency Medicine | Admitting: Emergency Medicine

## 2023-07-07 DIAGNOSIS — F17200 Nicotine dependence, unspecified, uncomplicated: Secondary | ICD-10-CM | POA: Diagnosis not present

## 2023-07-07 DIAGNOSIS — F17210 Nicotine dependence, cigarettes, uncomplicated: Secondary | ICD-10-CM | POA: Insufficient documentation

## 2023-07-07 DIAGNOSIS — M25552 Pain in left hip: Secondary | ICD-10-CM | POA: Insufficient documentation

## 2023-07-07 DIAGNOSIS — I2510 Atherosclerotic heart disease of native coronary artery without angina pectoris: Secondary | ICD-10-CM | POA: Insufficient documentation

## 2023-07-07 DIAGNOSIS — Z955 Presence of coronary angioplasty implant and graft: Secondary | ICD-10-CM | POA: Insufficient documentation

## 2023-07-07 DIAGNOSIS — I10 Essential (primary) hypertension: Secondary | ICD-10-CM | POA: Diagnosis not present

## 2023-07-07 MED ORDER — OXYCODONE HCL 5 MG PO TABS
5.0000 mg | ORAL_TABLET | Freq: Once | ORAL | Status: AC
  Administered 2023-07-07: 5 mg via ORAL
  Filled 2023-07-07: qty 1

## 2023-07-07 MED ORDER — ACETAMINOPHEN 500 MG PO TABS
ORAL_TABLET | ORAL | Status: AC
  Filled 2023-07-07: qty 1

## 2023-07-07 MED ORDER — LIDOCAINE 5 % EX PTCH
1.0000 | MEDICATED_PATCH | Freq: Once | CUTANEOUS | Status: DC
  Administered 2023-07-07: 1 via TRANSDERMAL
  Filled 2023-07-07: qty 1

## 2023-07-07 MED ORDER — ACETAMINOPHEN 500 MG PO TABS
1000.0000 mg | ORAL_TABLET | Freq: Once | ORAL | Status: AC
  Administered 2023-07-07: 1000 mg via ORAL
  Filled 2023-07-07: qty 2

## 2023-07-07 NOTE — ED Provider Notes (Signed)
 [place] EMERGENCY DEPARTMENT AT [facility] Provider Note  History  Chief Complaint:  lt hip pain  The history is provided by the patient and medical records.     Kevin Barton is a 63 y.o. male with a history of chronic back and knee pain who presents with atraumatic left hip pain.  He reports that this has been hurting for several years.  He reports no fever, chills.  He reports that when the weather changes he has worsening of his joint pain.  He states that they have taken x-rays in the past which show severe arthritis.  He feels this is the likely culprit of his hip pain today.  He denies any falls.  He denies any other trauma.  On review of the medical record patient does have severe arthritis of the left knee and hip on x-rays dated April 22.  Past Medical History:  Diagnosis Date   Arthritis    CAD (coronary artery disease)    cath 06/2016 occlusion of RCA with collateralization from the left coronary artery. Mild nonobstructive plaque in the left main, LAD, and LCx. LVEF 50%. TX Rx.    Chronic back pain    Chronic knee pain    HTN (hypertension)    Hyperlipidemia    NSTEMI (non-ST elevated myocardial infarction) (HCC)    Stroke (HCC)    Substance abuse (HCC)    Tobacco abuse     Past Surgical History:  Procedure Laterality Date   IR ANGIO INTRA EXTRACRAN SEL COM CAROTID INNOMINATE UNI L MOD SED  10/05/2017   IR ANGIO VERTEBRAL SEL SUBCLAVIAN INNOMINATE UNI R MOD SED  10/05/2017   IR CT HEAD LTD  10/05/2017   IR INTRAVSC STENT CERV CAROTID W/O EMB-PROT MOD SED INC ANGIO  10/05/2017   IR PERCUTANEOUS ART THROMBECTOMY/INFUSION INTRACRANIAL INC DIAG ANGIO  10/05/2017   LEFT HEART CATH AND CORONARY ANGIOGRAPHY N/A 07/25/2016   Procedure: Left Heart Cath and Coronary Angiography;  Surgeon: Arnoldo Lapping, MD;  Location: [facility] INVASIVE CV LAB;  Service: Cardiovascular;  Laterality: N/A;   RADIOLOGY WITH ANESTHESIA N/A 10/05/2017   Procedure: IR WITH ANESTHESIA CODE  STROKE;  Surgeon: Luellen Sages, MD;  Location: [facility] OR;  Service: Radiology;  Laterality: N/A;    Family History  Problem Relation Age of Onset   Diabetes Brother     Social History   Tobacco Use   Smoking status: Every Day    Current packs/day: 0.50    Types: Cigarettes   Smokeless tobacco: Never   Tobacco comments:    smoke 6 per week  Vaping Use   Vaping status: Never Used  Substance Use Topics   Alcohol use: Yes    Comment: a beer a day   Drug use: Yes    Types: Marijuana    Review of Systems  Review of Systems   Reviewed and documented in HPI if pertinent.   Physical Exam   ED Triage Vitals  Encounter Vitals Group     BP 07/07/23 1645 (!) 147/83     Systolic BP Percentile --      Diastolic BP Percentile --      Pulse Rate 07/07/23 1645 65     Resp 07/07/23 1645 17     Temp 07/07/23 1645 (!) 97.5 F (36.4 C)     Temp src --      SpO2 07/07/23 1645 100 %     Weight 07/07/23 1650 171 lb 1.2 oz (77.6 kg)  Height 07/07/23 1650 5\' 4"  (1.626 m)     Head Circumference --      Peak Flow --      Pain Score 07/07/23 1649 10     Pain Loc --      Pain Education --      Exclude from Growth Chart --      Physical Exam Vitals and nursing note reviewed.  Constitutional:      General: He is not in acute distress.    Appearance: He is well-developed.  HENT:     Head: Normocephalic and atraumatic.  Eyes:     Conjunctiva/sclera: Conjunctivae normal.  Cardiovascular:     Rate and Rhythm: Normal rate and regular rhythm.     Heart sounds: No murmur heard. Pulmonary:     Effort: Pulmonary effort is normal. No respiratory distress.     Breath sounds: Normal breath sounds.  Abdominal:     Palpations: Abdomen is soft.     Tenderness: There is no abdominal tenderness.  Musculoskeletal:        General: No swelling.     Cervical back: Neck supple.     Comments: 5-5 strength in hip flexors bilaterally, patient able to stand and walk with mild antalgic gait to  the left lower extremity; left lower extremity is warm well-perfused without wounds, patient able to stand on the unaffected side without any difficulty, patient able to shift his weight to the left side with exacerbation of pain however able to stand  Skin:    General: Skin is warm and dry.     Capillary Refill: Capillary refill takes less than 2 seconds.  Neurological:     Mental Status: He is alert.  Psychiatric:        Mood and Affect: Mood normal.      Procedures   Procedures  ED Course - Medical Decision Making  Brief Overview Kevin Barton is a 63 y.o. male who presents as per above.  I have reviewed the nursing documentation for past medical history, family history, and social history and agree.  I have reviewed the patient's vital signs. There are no abnormalities.  Initial Differential Diagnoses: I am primarily concerned for fracture, dislocation, MSK sprain, chronic hip osteoarthritis.  Therapies: These medications and interventions were provided for the patient while in the ED.  Medications  lidocaine  (LIDODERM ) 5 % 1 patch (1 patch Transdermal Patch Applied 07/07/23 1726)  acetaminophen  (TYLENOL ) tablet 1,000 mg (1,000 mg Oral Given 07/07/23 1726)  oxyCODONE  (Oxy IR/ROXICODONE ) immediate release tablet 5 mg (5 mg Oral Given 07/07/23 1726)  acetaminophen  (TYLENOL ) 500 MG tablet (  Return to [facility] 07/07/23 1738)    See the EMR for full details regarding lab and imaging results.   Medical Decision Making 63 year old male who presents the emergency department for left hip pain.  On examination patient has normal strength and sensation in this extremity.  The joint is not warm or erythematous.  Patient is able to walk with a mild antalgic gait.  The extremity is warm well-perfused.  He has a normal PT pulse.  On review of x-rays patient has had severe osteoarthritis documented in the knee and hip of the affected extremity dated April 22.  Patient reports no new trauma.   No fevers.  Therefore do not feel that imaging is necessary at this time.  Likely patient is suffering from chronic osteoarthritis.  He attributes to the weather changes.  This is likely.  Therefore we will treat symptomatically with  acetaminophen , lidocaine  patch and 1 oxycodone  here in the emergency department.  I do not feel the chronic opioid therapy is beneficial at this time.  We encouraged him to follow-up with his primary care doctor.  Problems Addressed: Hip pain, left: chronic illness or injury with exacerbation, progression, or side effects of treatment  Risk OTC drugs. Prescription drug management.     ### All radiography studies, electrocardiograms, and laboratory data were personally reviewed by me and incorporated into my medical decision making. Impression   1. Hip pain, left      Note: Dragon medical dictation software was used in the creation of this note.     Arminda Landmark, MD 07/07/23 [place]    Jerilynn Montenegro, MD 07/08/23 1037

## 2023-07-07 NOTE — ED Triage Notes (Signed)
 The  pt has chronic hip pain  he is here today with lt hip pain   no known injury  weather has much to do with his hip pain

## 2023-07-07 NOTE — Discharge Instructions (Addendum)
 Kevin Barton:  Thank you for allowing us  to take care of you today.  We hope you begin feeling better soon. You were seen today for chronic hip pain.  Please use symptomatic medications such as Tylenol  for pain control.  To-Do:  Please follow-up with your primary doctor within the next 2-3 days. It is important that you review any labs or imaging results (if any) that you had today with them. Your preliminary imaging results (if any) are attached. Please return to the Emergency Department or call 911 if you experience chest pain, shortness of breath, severe pain, severe fever, altered mental status, or have any reason to think that you need emergency medical care.  Thank you again.  Hope you feel better soon.  Arminda Landmark, MD Department of Emergency Medicine

## 2023-07-28 ENCOUNTER — Encounter (HOSPITAL_COMMUNITY): Payer: Self-pay | Admitting: *Deleted

## 2023-07-28 ENCOUNTER — Other Ambulatory Visit: Payer: Self-pay

## 2023-07-28 ENCOUNTER — Emergency Department (HOSPITAL_COMMUNITY)
Admission: EM | Admit: 2023-07-28 | Discharge: 2023-07-28 | Disposition: A | Discharge status: Home / Self Care | Attending: Student | Admitting: Student

## 2023-07-28 DIAGNOSIS — Z7982 Long term (current) use of aspirin: Secondary | ICD-10-CM | POA: Diagnosis not present

## 2023-07-28 DIAGNOSIS — M79675 Pain in left toe(s): Secondary | ICD-10-CM | POA: Diagnosis present

## 2023-07-28 DIAGNOSIS — F17200 Nicotine dependence, unspecified, uncomplicated: Secondary | ICD-10-CM | POA: Diagnosis not present

## 2023-07-28 DIAGNOSIS — L600 Ingrowing nail: Secondary | ICD-10-CM | POA: Insufficient documentation

## 2023-07-28 DIAGNOSIS — L03032 Cellulitis of left toe: Secondary | ICD-10-CM | POA: Insufficient documentation

## 2023-07-28 MED ORDER — AMOXICILLIN-POT CLAVULANATE 875-125 MG PO TABS: 1.0000 | ORAL_TABLET | Freq: Two times a day (BID) | ORAL | 0 refills | Status: DC

## 2023-07-28 NOTE — ED Provider Notes (Signed)
 [place] EMERGENCY DEPARTMENT AT [facility] Provider Note   CSN: [id number] Arrival date & time: 07/28/23  2125     History  Chief Complaint  Patient presents with   Nail Problem    Kevin Barton is a 63 y.o. male.  Patient complains of pain in the left great toe thought to be due to an ingrown toenail for the past 3 weeks.  The patient denies history of diabetes.  Denies any injury to the area.  HPI     Home Medications Prior to Admission medications   Medication Sig Start Date End Date Taking? Authorizing Provider  amoxicillin -clavulanate (AUGMENTIN ) 875-125 MG tablet Take 1 tablet by mouth every 12 (twelve) hours. 07/28/23  Yes Elisa Guest, PA-C  amLODipine  (NORVASC ) 5 MG tablet Take 1 tablet (5 mg total) by mouth daily. 11/14/21   Arnoldo Lapping, MD  aspirin  EC 81 MG tablet Take 1 tablet (81 mg total) by mouth 2 (two) times daily. To be taken after surgery to prevent blood clots if not on any other blood thinners Patient not taking: Reported on 03/18/2023 12/19/22 12/19/23  Sandie Cross, PA-C  atorvastatin  (LIPITOR ) 80 MG tablet Take 1 tablet (80 mg total) by mouth daily at 6 PM. 11/14/21   Arnoldo Lapping, MD  BAYER LOW DOSE 81 MG tablet Take 81 mg by mouth daily. Swallow whole.    [provider]  BRILINTA  90 MG TABS tablet Take 90 mg by mouth in the morning.    [provider]  cyclobenzaprine  (FLEXERIL ) 10 MG tablet Take 1 tablet (10 mg total) by mouth 2 (two) times daily as needed for muscle spasms. 06/06/23   Adel Aden, PA-C  docusate sodium  (COLACE) 100 MG capsule Take 1 capsule (100 mg total) by mouth daily as needed. Patient not taking: Reported on 03/18/2023 12/19/22 12/19/23  Sandie Cross, PA-C  lidocaine  (LIDODERM ) 5 % Place 1 patch onto the skin daily. Remove & Discard patch within 12 hours or as directed by MD 06/06/23   Adel Aden, PA-C  methocarbamol  (ROBAXIN -750) 750 MG tablet Take 1 tablet (750 mg  total) by mouth 2 (two) times daily as needed for muscle spasms. Patient not taking: Reported on 03/18/2023 12/19/22   Sandie Cross, PA-C  naproxen  (NAPROSYN ) 500 MG tablet Take 1 tablet (500 mg total) by mouth 2 (two) times daily. 06/06/23   Adel Aden, PA-C  ondansetron  (ZOFRAN ) 4 MG tablet Take 1 tablet (4 mg total) by mouth every 8 (eight) hours as needed for nausea or vomiting. Patient not taking: Reported on 03/18/2023 12/19/22   Sandie Cross, PA-C  oxyCODONE -acetaminophen  (PERCOCET) 5-325 MG tablet Take 1-2 tablets by mouth every 8 (eight) hours as needed. 06/08/23   Sherel Dikes, PA-C  Vitamin D, Ergocalciferol, (DRISDOL) 1.25 MG (50000 UNIT) CAPS capsule Take 50,000 Units by mouth once a week. 10/01/22   [provider]      Allergies    Patient has no known allergies.    Review of Systems   Review of Systems  Physical Exam Updated Vital Signs BP 137/69 (BP Location: Left Arm)   Pulse 90   Temp 98.4 F (36.9 C)   Resp 19   SpO2 97%  Physical Exam Vitals and nursing note reviewed.  HENT:     Head: Normocephalic and atraumatic.  Eyes:     Pupils: Pupils are equal, round, and reactive to light.  Pulmonary:     Effort: Pulmonary effort is  normal. No respiratory distress.  Musculoskeletal:        General: No signs of injury.     Cervical back: Normal range of motion.     Comments: Large, thick toenail with some redness along the lateral nail fold.  Skin:    General: Skin is dry.  Neurological:     Mental Status: He is alert.  Psychiatric:        Speech: Speech normal.        Behavior: Behavior normal.     ED Results / Procedures / Treatments   Labs (all labs ordered are listed, but only abnormal results are displayed) Labs Reviewed - No data to display  EKG None  Radiology No results found.  Procedures Procedures    Medications Ordered in ED Medications - No data to display  ED Course/ Medical Decision Making/ A&P                                  Medical Decision Making  This patient presents to the ED for concern of possible ingrown toenail,   Co morbidities / Chronic conditions that complicate the patient evaluation  Tobacco abuse, history of stroke   Additional history obtained:  Additional history obtained from EMR  Social Determinants of Health:  Patient is a daily smoker   Test / Admission - Considered:  Patient with signs of possible cellulitis of the left great toe with a large overgrown toenail.  Will treat with Augmentin  and refer to podiatry for further footcare.  No indication for imaging or lab work at this time.         Final Clinical Impression(s) / ED Diagnoses Final diagnoses:  Ingrown toenail  Cellulitis of toe of left foot    Rx / DC Orders ED Discharge Orders          Ordered    amoxicillin -clavulanate (AUGMENTIN ) 875-125 MG tablet  Every 12 hours        07/28/23 2246              Lennis Rader B, PA-C 07/28/23 2250    Mordecai Applebaum, MD 07/31/23 [phone]

## 2023-07-28 NOTE — ED Triage Notes (Signed)
 Pt c/o left great toe ingrown toe nail for 3 weeks.

## 2023-07-28 NOTE — ED Notes (Signed)
 Discharge instructions reviewed.   Newly prescribed medications discussed. Pharmacy verified.   Opportunity for questions and concerns provided.   Alert, oriented and escorted to waiting area via wheelchair.   Displays no signs of distress.   Encouraged to follow up with podiatry. Declined discharge set of vital signs.

## 2023-07-28 NOTE — Discharge Instructions (Signed)
 I prescribed an antibiotic to cover for mild infection related to your toenail.  Please follow-up with podiatry or your primary care provider for further footcare.

## 2023-08-16 ENCOUNTER — Other Ambulatory Visit: Payer: Self-pay

## 2023-08-16 ENCOUNTER — Emergency Department (HOSPITAL_COMMUNITY)
Admission: EM | Admit: 2023-08-16 | Discharge: 2023-08-17 | Disposition: A | Discharge status: Home / Self Care | Attending: Emergency Medicine | Admitting: Emergency Medicine

## 2023-08-16 DIAGNOSIS — Z7982 Long term (current) use of aspirin: Secondary | ICD-10-CM | POA: Insufficient documentation

## 2023-08-16 DIAGNOSIS — M7989 Other specified soft tissue disorders: Secondary | ICD-10-CM | POA: Diagnosis not present

## 2023-08-16 DIAGNOSIS — Z8673 Personal history of transient ischemic attack (TIA), and cerebral infarction without residual deficits: Secondary | ICD-10-CM | POA: Diagnosis not present

## 2023-08-16 DIAGNOSIS — I10 Essential (primary) hypertension: Secondary | ICD-10-CM | POA: Insufficient documentation

## 2023-08-16 DIAGNOSIS — Z79899 Other long term (current) drug therapy: Secondary | ICD-10-CM | POA: Diagnosis not present

## 2023-08-16 DIAGNOSIS — R600 Localized edema: Secondary | ICD-10-CM | POA: Insufficient documentation

## 2023-08-16 LAB — CBC
HCT: 38.3 % — ABNORMAL LOW (ref 39.0–52.0)
Hemoglobin: 13.6 g/dL (ref 13.0–17.0)
MCH: 31.3 pg (ref 26.0–34.0)
MCHC: 35.5 g/dL (ref 30.0–36.0)
MCV: 88 fL (ref 80.0–100.0)
Platelets: 267 10*3/uL (ref 150–400)
RBC: 4.35 MIL/uL (ref 4.22–5.81)
RDW: 14 % (ref 11.5–15.5)
WBC: 6.3 10*3/uL (ref 4.0–10.5)
nRBC: 0 % (ref 0.0–0.2)

## 2023-08-16 LAB — BASIC METABOLIC PANEL WITH GFR
Anion gap: 14 (ref 5–15)
BUN: 9 mg/dL (ref 8–23)
CO2: 21 mmol/L — ABNORMAL LOW (ref 22–32)
Calcium: 9.5 mg/dL (ref 8.9–10.3)
Chloride: 105 mmol/L (ref 98–111)
Creatinine, Ser: 0.88 mg/dL (ref 0.61–1.24)
GFR, Estimated: >60 mL/min (ref >60)
Glucose, Bld: 95 mg/dL (ref 70–99)
Potassium: 3.5 mmol/L (ref 3.5–5.1)
Sodium: 140 mmol/L (ref 135–145)

## 2023-08-16 NOTE — ED Triage Notes (Signed)
 Patient reports increasing left lower leg swelling/edema for 3 weeks , denies injury , denies SOB or fever .

## 2023-08-16 NOTE — ED Provider Triage Note (Signed)
 Emergency Medicine Provider Triage Evaluation Note  Kevin Barton , a 63 y.o. male  was evaluated in triage.  Pt complains of left lower extremity swelling.  He was have been ongoing for 3 weeks, patient does note swelling to bilateral lower extremities however the left is significantly more swollen.  He is on his feet quite a bit during the day, but reports even after he has slept all night his legs still remain swollen when he wakes up.  Denies chest pain, shortness of breath, no history of blood clots.  Review of Systems  Positive: As above Negative: As above  Physical Exam  BP (!) 140/68 (BP Location: Right Arm)   Pulse 82   Temp 98.1 F (36.7 C)   Resp 18   SpO2 93%  Gen:   Awake, no distress   Resp:  Normal effort  MSK:   Moves extremities without difficulty  Other:  1+ pitting edema of left lower extremity, 2+ DP pulse  Medical Decision Making  Medically screening exam initiated at 9:10 PM.  Appropriate orders placed.  Kevin Barton was informed that the remainder of the evaluation will be completed by another provider, this initial triage assessment does not replace that evaluation, and the importance of remaining in the ED until their evaluation is complete.     Kendrick Pax, [place] 08/16/23 2111

## 2023-08-17 ENCOUNTER — Ambulatory Visit (HOSPITAL_COMMUNITY): Attending: Emergency Medicine

## 2023-08-17 NOTE — ED Provider Notes (Addendum)
 [place] EMERGENCY DEPARTMENT AT [facility] Provider Note   CSN: [id number] Arrival date & time: 08/16/23  2047     Patient presents with: Left Lower Leg Swelling   Kevin Barton is a 63 y.o. male with history of chronic back pain, chronic knee pain, NSTEMI, stroke, hypertension.  Patient presents to ED for evaluation of left leg swelling.  States that over the last 1 month he has noticed progressively worsening left lower extremity swelling.  He denies any chest pain, shortness of breath, nausea vomiting or abdominal pain.  Denies lightheadedness, dizziness or weakness.  Denies history of DVT or PE.  He states that he does have swelling to his bilateral lower however reports that his swelling to his left lower extremity is much worse.       Prior to Admission medications   Medication Sig Start Date End Date Taking? Authorizing Provider  amLODipine  (NORVASC ) 5 MG tablet Take 1 tablet (5 mg total) by mouth daily. 11/14/21   Wonda Sharper, MD  amoxicillin -clavulanate (AUGMENTIN ) 875-125 MG tablet Take 1 tablet by mouth every 12 (twelve) hours. 07/28/23   Logan Ubaldo NOVAK, PA-C  aspirin  EC 81 MG tablet Take 1 tablet (81 mg total) by mouth 2 (two) times daily. To be taken after surgery to prevent blood clots if not on any other blood thinners Patient not taking: Reported on 03/18/2023 12/19/22 12/19/23  Jule Ronal CROME, PA-C  atorvastatin  (LIPITOR ) 80 MG tablet Take 1 tablet (80 mg total) by mouth daily at 6 PM. 11/14/21   Wonda Sharper, MD  BAYER LOW DOSE 81 MG tablet Take 81 mg by mouth daily. Swallow whole.    [provider]  BRILINTA  90 MG TABS tablet Take 90 mg by mouth in the morning.    [provider]  cyclobenzaprine  (FLEXERIL ) 10 MG tablet Take 1 tablet (10 mg total) by mouth 2 (two) times daily as needed for muscle spasms. 06/06/23   Ruthell Lonni FALCON, PA-C  docusate sodium  (COLACE) 100 MG capsule Take 1 capsule (100 mg total) by mouth daily as  needed. Patient not taking: Reported on 03/18/2023 12/19/22 12/19/23  Jule Ronal CROME, PA-C  lidocaine  (LIDODERM ) 5 % Place 1 patch onto the skin daily. Remove & Discard patch within 12 hours or as directed by MD 06/06/23   Ruthell Lonni FALCON, PA-C  methocarbamol  (ROBAXIN -750) 750 MG tablet Take 1 tablet (750 mg total) by mouth 2 (two) times daily as needed for muscle spasms. Patient not taking: Reported on 03/18/2023 12/19/22   Jule Ronal CROME, PA-C  naproxen  (NAPROSYN ) 500 MG tablet Take 1 tablet (500 mg total) by mouth 2 (two) times daily. 06/06/23   Ruthell Lonni FALCON, PA-C  ondansetron  (ZOFRAN ) 4 MG tablet Take 1 tablet (4 mg total) by mouth every 8 (eight) hours as needed for nausea or vomiting. Patient not taking: Reported on 03/18/2023 12/19/22   Jule Ronal CROME, PA-C  oxyCODONE -acetaminophen  (PERCOCET) 5-325 MG tablet Take 1-2 tablets by mouth every 8 (eight) hours as needed. 06/08/23   Vicky Charleston, PA-C  Vitamin D, Ergocalciferol, (DRISDOL) 1.25 MG (50000 UNIT) CAPS capsule Take 50,000 Units by mouth once a week. 10/01/22   [provider]    Allergies: Patient has no known allergies.    Review of Systems  Updated Vital Signs BP (!) 140/74 (BP Location: Right Arm)   Pulse 75   Temp 97.8 F (36.6 C) (Oral)   Resp (!) 21   SpO2 100%   Physical Exam Vitals  and nursing note reviewed.  Constitutional:      General: He is not in acute distress.    Appearance: He is well-developed.  HENT:     Head: Normocephalic and atraumatic.   Eyes:     Conjunctiva/sclera: Conjunctivae normal.    Cardiovascular:     Rate and Rhythm: Normal rate and regular rhythm.     Heart sounds: No murmur heard.    Comments: Patient with 2+ nonpitting edema to left lower extremity.  1+ nonpitting edema to right lower extremity. Pulmonary:     Effort: Pulmonary effort is normal. No respiratory distress.     Breath sounds: Normal breath sounds.  Abdominal:     Palpations: Abdomen is  soft.     Tenderness: There is no abdominal tenderness.   Musculoskeletal:        General: No swelling.     Cervical back: Neck supple.     Right lower leg: 1+ Edema present.     Left lower leg: 2+ Edema present.   Skin:    General: Skin is warm and dry.     Capillary Refill: Capillary refill takes less than 2 seconds.   Neurological:     Mental Status: He is alert.   Psychiatric:        Mood and Affect: Mood normal.     (all labs ordered are listed, but only abnormal results are displayed) Labs Reviewed  CBC - Abnormal; Notable for the following components:      Result Value   HCT 38.3 (*)    All other components within normal limits  BASIC METABOLIC PANEL WITH GFR - Abnormal; Notable for the following components:   CO2 21 (*)    All other components within normal limits    EKG: None  Radiology: No results found.   Procedures   Medications Ordered in the ED - No data to display   Medical Decision Making  63 year old presents for evaluation.  Please see HPI for further details.  On examination the patient is afebrile and nontachycardic.  His lung sounds are clear bilaterally, he is not hypoxic.  Abdomen is soft and compressible.  Neurological examination at baseline.  Patient right lower extremity with 1+ nonpitting edema.  Patient left lower extremity with 2+ nonpitting edema.  Workup initiated in triage include CBC, BMP and ultrasound study of left lower extremity.  Patient CBC without leukocytosis or anemia.  Metabolic panel without electrolyte derangement.  Unfortunately, no ultrasound techs available at this time for DVT study.  Patient will have outpatient ultrasound study ordered for the morning and he was advised to return to the ED check-in area at 8 AM for study.  He voiced understanding.  He denies any chest pain or shortness of breath so do not have any indication for further workup at this time.  Patient will return in the morning for ultrasound study.   He is agreeable to this.  Patient discharged in stable condition.    Final diagnoses:  Left leg swelling    ED Discharge Orders          Ordered    LE VENOUS        08/17/23 0132               Ruthell Lonni FALCON, PA-C 08/17/23 0208    Ruthell Lonni FALCON, PA-C 08/17/23 9791    Geroldine Berg, MD 08/17/23 [phone]

## 2023-08-17 NOTE — Discharge Instructions (Addendum)
 It was a pleasure taking part in your care.  As discussed, please return to the ED at 8 AM for ultrasound study.  Please return prior to 8 AM if you develop any chest pain or shortness of breath.  If tomorrow is a Saturday, Sunday or holiday, please go to the [facility] Emergency Department Registration Desk at 11 am tomorrow morning and tell them you are there for a vascular study.  If tomorrow is a weekday (Monday-Friday), please go to the [facility] (address [address] [place]) at 8 am and report to the 4th floor registration Zone A.  Inform registration that you are there for a vascular study.

## 2023-08-20 ENCOUNTER — Emergency Department (HOSPITAL_COMMUNITY)
Admission: EM | Admit: 2023-08-20 | Discharge: 2023-08-20 | Disposition: A | Discharge status: Home / Self Care | Attending: Emergency Medicine | Admitting: Emergency Medicine

## 2023-08-20 ENCOUNTER — Other Ambulatory Visit: Payer: Self-pay

## 2023-08-20 DIAGNOSIS — M7989 Other specified soft tissue disorders: Secondary | ICD-10-CM

## 2023-08-20 DIAGNOSIS — I10 Essential (primary) hypertension: Secondary | ICD-10-CM | POA: Insufficient documentation

## 2023-08-20 DIAGNOSIS — I2510 Atherosclerotic heart disease of native coronary artery without angina pectoris: Secondary | ICD-10-CM | POA: Insufficient documentation

## 2023-08-20 DIAGNOSIS — Z720 Tobacco use: Secondary | ICD-10-CM | POA: Diagnosis not present

## 2023-08-20 DIAGNOSIS — R2242 Localized swelling, mass and lump, left lower limb: Secondary | ICD-10-CM | POA: Insufficient documentation

## 2023-08-20 DIAGNOSIS — Z79899 Other long term (current) drug therapy: Secondary | ICD-10-CM | POA: Insufficient documentation

## 2023-08-20 DIAGNOSIS — Z7982 Long term (current) use of aspirin: Secondary | ICD-10-CM | POA: Diagnosis not present

## 2023-08-20 LAB — CBC WITH DIFFERENTIAL/PLATELET
Abs Immature Granulocytes: 0.01 10*3/uL (ref 0.00–0.07)
Basophils Absolute: 0.1 10*3/uL (ref 0.0–0.1)
Basophils Relative: 1 %
Eosinophils Absolute: 0.4 10*3/uL (ref 0.0–0.5)
Eosinophils Relative: 7 %
HCT: 37.9 % — ABNORMAL LOW (ref 39.0–52.0)
Hemoglobin: 13.3 g/dL (ref 13.0–17.0)
Immature Granulocytes: 0 %
Lymphocytes Relative: 48 %
Lymphs Abs: 2.9 10*3/uL (ref 0.7–4.0)
MCH: 31.1 pg (ref 26.0–34.0)
MCHC: 35.1 g/dL (ref 30.0–36.0)
MCV: 88.8 fL (ref 80.0–100.0)
Monocytes Absolute: 0.8 10*3/uL (ref 0.1–1.0)
Monocytes Relative: 13 %
Neutro Abs: 1.8 10*3/uL (ref 1.7–7.7)
Neutrophils Relative %: 31 %
Platelets: 252 10*3/uL (ref 150–400)
RBC: 4.27 MIL/uL (ref 4.22–5.81)
RDW: 14.3 % (ref 11.5–15.5)
WBC: 6 10*3/uL (ref 4.0–10.5)
nRBC: 0 % (ref 0.0–0.2)

## 2023-08-20 LAB — BASIC METABOLIC PANEL WITH GFR
Anion gap: 10 (ref 5–15)
BUN: 14 mg/dL (ref 8–23)
CO2: 23 mmol/L (ref 22–32)
Calcium: 9.3 mg/dL (ref 8.9–10.3)
Chloride: 106 mmol/L (ref 98–111)
Creatinine, Ser: 1.02 mg/dL (ref 0.61–1.24)
GFR, Estimated: >60 mL/min (ref >60)
Glucose, Bld: 90 mg/dL (ref 70–99)
Potassium: 4 mmol/L (ref 3.5–5.1)
Sodium: 139 mmol/L (ref 135–145)

## 2023-08-20 LAB — D-DIMER, QUANTITATIVE: D-Dimer, Quant: 1.48 ug{FEU}/mL — ABNORMAL HIGH (ref 0.00–0.50)

## 2023-08-20 MED ORDER — ENOXAPARIN SODIUM 80 MG/0.8ML IJ SOSY
80.0000 mg | PREFILLED_SYRINGE | Freq: Once | INTRAMUSCULAR | Status: AC
  Administered 2023-08-20: 80 mg via SUBCUTANEOUS
  Filled 2023-08-20: qty 0.8

## 2023-08-20 MED ORDER — ENOXAPARIN SODIUM 80 MG/0.8ML IJ SOSY: 1.0000 mg/kg | PREFILLED_SYRINGE | Freq: Once | INTRAMUSCULAR | Status: DC

## 2023-08-20 NOTE — ED Notes (Signed)
Pt called X2 to go to a room. Pt could not be found.

## 2023-08-20 NOTE — ED Provider Notes (Signed)
 [place] EMERGENCY DEPARTMENT AT [facility] Provider Note   CSN: [id number] Arrival date & time: 08/20/23  8063     Patient presents with: Left Leg Swelling   Kevin Barton is a 63 y.o. male.   Patient with history of CAD, hypertension, hyperlipidemia, NSTEMI, stroke, substance use presents today with complaints of left leg swelling. He states that same has been present for over 1 month. Denies any significant pain to his leg. No fevers or chills. No chest pain or shortness of breath. No hx of DVT or PE. He is not anticoagulated. Has been seen for this previously, US  was unfortunately not available and he was set up with an outpatient study, however states that he works during the day and has had difficulty getting the study done during ultrasound hours.  Denies any worsening of symptoms since he was seen last.  The history is provided by the patient. No language interpreter was used.       Prior to Admission medications   Medication Sig Start Date End Date Taking? Authorizing Provider  amLODipine  (NORVASC ) 5 MG tablet Take 1 tablet (5 mg total) by mouth daily. 11/14/21   Wonda Sharper, MD  amoxicillin -clavulanate (AUGMENTIN ) 875-125 MG tablet Take 1 tablet by mouth every 12 (twelve) hours. 07/28/23   Logan Ubaldo NOVAK, PA-C  aspirin  EC 81 MG tablet Take 1 tablet (81 mg total) by mouth 2 (two) times daily. To be taken after surgery to prevent blood clots if not on any other blood thinners Patient not taking: Reported on 03/18/2023 12/19/22 12/19/23  Jule Ronal CROME, PA-C  atorvastatin  (LIPITOR ) 80 MG tablet Take 1 tablet (80 mg total) by mouth daily at 6 PM. 11/14/21   Wonda Sharper, MD  BAYER LOW DOSE 81 MG tablet Take 81 mg by mouth daily. Swallow whole.    [provider]  BRILINTA  90 MG TABS tablet Take 90 mg by mouth in the morning.    [provider]  cyclobenzaprine  (FLEXERIL ) 10 MG tablet Take 1 tablet (10 mg total) by mouth 2 (two) times daily as  needed for muscle spasms. 06/06/23   Ruthell Lonni FALCON, PA-C  docusate sodium  (COLACE) 100 MG capsule Take 1 capsule (100 mg total) by mouth daily as needed. Patient not taking: Reported on 03/18/2023 12/19/22 12/19/23  Jule Ronal CROME, PA-C  lidocaine  (LIDODERM ) 5 % Place 1 patch onto the skin daily. Remove & Discard patch within 12 hours or as directed by MD 06/06/23   Ruthell Lonni FALCON, PA-C  methocarbamol  (ROBAXIN -750) 750 MG tablet Take 1 tablet (750 mg total) by mouth 2 (two) times daily as needed for muscle spasms. Patient not taking: Reported on 03/18/2023 12/19/22   Jule Ronal CROME, PA-C  naproxen  (NAPROSYN ) 500 MG tablet Take 1 tablet (500 mg total) by mouth 2 (two) times daily. 06/06/23   Ruthell Lonni FALCON, PA-C  ondansetron  (ZOFRAN ) 4 MG tablet Take 1 tablet (4 mg total) by mouth every 8 (eight) hours as needed for nausea or vomiting. Patient not taking: Reported on 03/18/2023 12/19/22   Jule Ronal CROME, PA-C  oxyCODONE -acetaminophen  (PERCOCET) 5-325 MG tablet Take 1-2 tablets by mouth every 8 (eight) hours as needed. 06/08/23   Vicky Charleston, PA-C  Vitamin D, Ergocalciferol, (DRISDOL) 1.25 MG (50000 UNIT) CAPS capsule Take 50,000 Units by mouth once a week. 10/01/22   [provider]    Allergies: Patient has no known allergies.    Review of Systems  Cardiovascular:  Positive for leg swelling.  All other systems reviewed and are negative.   Updated Vital Signs BP (!) 140/72 (BP Location: Right Arm)   Pulse 67   Temp 98.4 F (36.9 C) (Oral)   Resp 18   SpO2 97%   Physical Exam Vitals and nursing note reviewed.  Constitutional:      General: He is not in acute distress.    Appearance: Normal appearance. He is normal weight. He is not ill-appearing, toxic-appearing or diaphoretic.  HENT:     Head: Normocephalic and atraumatic.   Cardiovascular:     Rate and Rhythm: Normal rate.  Pulmonary:     Effort: Pulmonary effort is normal. No respiratory distress.    Musculoskeletal:        General: Normal range of motion.     Cervical back: Normal range of motion.     Comments: Patient with edema to left lower extremity. DP and PT pulses intact and 2+. No erythema, warmth, fluctuance, induration. No significant tenderness to palpation. No palpable cord. Negative Homans sign. Trace edema to right lower extremity. Good distal pulses and sensation.    Skin:    General: Skin is warm and dry.   Neurological:     General: No focal deficit present.     Mental Status: He is alert.   Psychiatric:        Mood and Affect: Mood normal.        Behavior: Behavior normal.     (all labs ordered are listed, but only abnormal results are displayed) Labs Reviewed  CBC WITH DIFFERENTIAL/PLATELET - Abnormal; Notable for the following components:      Result Value   HCT 37.9 (*)    All other components within normal limits  D-DIMER, QUANTITATIVE - Abnormal; Notable for the following components:   D-Dimer, Quant 1.48 (*)    All other components within normal limits  BASIC METABOLIC PANEL WITH GFR    EKG: None  Radiology: No results found.   Procedures   Medications Ordered in the ED  enoxaparin (LOVENOX) injection 75 mg (has no administration in time range)                                    Medical Decision Making Risk Prescription drug management.   This patient is a 63 y.o. male who presents to the ED for concern of LLE swelling, this involves an extensive number of treatment options, and is a complaint that carries with it a high risk of complications and morbidity. The emergent differential diagnosis prior to evaluation includes, but is not limited to,  CHF, DVT, CKD, cellulitis. This is not an exhaustive differential.   Past Medical History / Co-morbidities / Social History:  has a past medical history of Arthritis, CAD (coronary artery disease), Chronic back pain, Chronic knee pain, HTN (hypertension), Hyperlipidemia, NSTEMI (non-ST  elevated myocardial infarction) (HCC), Stroke (HCC), Substance abuse (HCC), and Tobacco abuse.  Additional history: Chart reviewed. Pertinent results include: seen previously for this, set up for outpatient DVT US  however did not go for this testing  Physical Exam: Physical exam performed. The pertinent findings include: per above, patient with edema to left lower extremity. DP and PT pulses intact and 2+. No erythema, warmth, fluctuance, induration. No significant tenderness to palpation. No palpable cord. Negative Homans sign. Trace edema to right lower extremity. Good distal pulses and sensation.    Lab Tests: I ordered, and personally interpreted  labs.  The pertinent results include:  d dimer 1.48. No other acute laboratory abnormalities   Medications: I ordered medication including Lovenox  for DVT prophylaxis. Reevaluation of the patient after these medicines showed that the patient stayed the same. I have reviewed the patients home medicines and have made adjustments as needed.   Disposition: After consideration of the diagnostic results and the patients response to treatment, I feel that emergency department workup does not suggest an emergent condition requiring admission or immediate intervention beyond what has been performed at this time. The plan is: discharge with appointment for DVT US  tomorrow morning. Given symptoms and elevated d dimer, will cover with Lovenox.  Emphasized importance of him coming during office hours to have the ultrasound performed.  Patient expressed understanding, states he can come in the morning.  He has no signs or symptoms to suggest cellulitis.  No concern for PE. Evaluation and diagnostic testing in the emergency department does not suggest an emergent condition requiring admission or immediate intervention beyond what has been performed at this time.  Plan for discharge with close PCP follow-up.  Patient is understanding and amenable with plan, educated on  red flag symptoms that would prompt immediate return.  Patient discharged in stable condition.  Findings and plan of care discussed with supervising physician Dr. Franklyn who is in agreement.   Final diagnoses:  Left leg swelling    ED Discharge Orders          Ordered    LE Venous       Comments: IMPORTANT PATIENT INSTRUCTIONS: You have been scheduled for an Outpatient Vascular Study at [facility].  If tomorrow is a Saturday, Sunday or holiday, please go to the [facility] Emergency Department Registration Desk at 11 am tomorrow morning and tell them you are there for a vascular study.  If tomorrow is a weekday (Monday-Friday), please go to the [facility] (address [address] [place]) at 8 am and report to the 4th floor registration Zone A.  Inform registration that you are there for a vascular study.   08/20/23 2326          An After Visit Summary was printed and given to the patient.      Kathya Wilz A, PA-C 08/20/23 2330    Franklyn Sid SAILOR, MD 08/21/23 [phone]

## 2023-08-20 NOTE — ED Notes (Signed)
 Pt found. Sitting on other side of wall.

## 2023-08-20 NOTE — ED Triage Notes (Signed)
 Patient reports chronic left lower leg swelling for several months , denies injury , no fever or chills. Ambulatory .

## 2023-08-20 NOTE — Discharge Instructions (Addendum)
 We have scheduled you for an outpatient ultrasound of your leg to look for a blood clot.  Is very important that you have this done at your earliest convenience.  Please be advised, you can go for this appointment anytime within the next 365 days as long as it is during the hours of 8 AM to 5 PM.  I recommend you go first thing tomorrow morning.  We did go ahead and treat you with a one-time dose of a blood thinning medication to cover if you do have a blood clot in your leg. Please follow-up with your pcp as well.   Return if development of any new or worsening symptoms

## 2023-08-20 NOTE — ED Provider Triage Note (Addendum)
 Emergency Medicine Provider Triage Evaluation Note  Kevin Barton , a 63 y.o. male  was evaluated in triage.  Pt complains of left leg swelling.  Was seen a few days ago however states the swelling has worsened and he has not been able to get the DVT study due to his work schedule.  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  Unfortunately ultrasound has left for today.  Given several days since last visit and still unable to get DVT study will add D-dimer to the blood work.  If negative he will not need the DVT study.  If positive he can get a potential anticoagulant coverage until he can come back tomorrow to have this done. No chest pain or shortness of breath.  Review of Systems  Positive: As above Negative: As above  Physical Exam  BP (!) 140/72 (BP Location: Right Arm)   Pulse 67   Temp 98.4 F (36.9 C) (Oral)   Resp 18   SpO2 97%  Gen:   Awake, no distress   Resp:  Normal effort  MSK:   Moves extremities without difficulty  Other:    Medical Decision Making  Medically screening exam initiated at 8:02 PM.  Appropriate orders placed.  Kevin Barton was informed that the remainder of the evaluation will be completed by another provider, this initial triage assessment does not replace that evaluation, and the importance of remaining in the ED until their evaluation is complete.      Kevin Loge, PA-C 08/20/23 2005

## 2023-08-21 ENCOUNTER — Other Ambulatory Visit: Payer: Self-pay

## 2023-08-21 ENCOUNTER — Emergency Department (HOSPITAL_COMMUNITY)
Admission: EM | Admit: 2023-08-21 | Discharge: 2023-08-22 | Disposition: A | Discharge status: Home / Self Care | Attending: Emergency Medicine | Admitting: Emergency Medicine

## 2023-08-21 ENCOUNTER — Ambulatory Visit (HOSPITAL_COMMUNITY)
Admission: RE | Admit: 2023-08-21 | Discharge: 2023-08-21 | Disposition: A | Discharge status: Home / Self Care | Source: Ambulatory Visit | Attending: Emergency Medicine | Admitting: Emergency Medicine

## 2023-08-21 DIAGNOSIS — M79662 Pain in left lower leg: Secondary | ICD-10-CM | POA: Diagnosis present

## 2023-08-21 DIAGNOSIS — Z79899 Other long term (current) drug therapy: Secondary | ICD-10-CM | POA: Diagnosis not present

## 2023-08-21 DIAGNOSIS — I2510 Atherosclerotic heart disease of native coronary artery without angina pectoris: Secondary | ICD-10-CM | POA: Insufficient documentation

## 2023-08-21 DIAGNOSIS — M660 Rupture of popliteal cyst: Secondary | ICD-10-CM | POA: Insufficient documentation

## 2023-08-21 DIAGNOSIS — R1032 Left lower quadrant pain: Secondary | ICD-10-CM | POA: Diagnosis present

## 2023-08-21 DIAGNOSIS — M7122 Synovial cyst of popliteal space [Baker], left knee: Secondary | ICD-10-CM | POA: Insufficient documentation

## 2023-08-21 DIAGNOSIS — Z7982 Long term (current) use of aspirin: Secondary | ICD-10-CM | POA: Diagnosis not present

## 2023-08-21 DIAGNOSIS — M7989 Other specified soft tissue disorders: Secondary | ICD-10-CM

## 2023-08-21 DIAGNOSIS — I10 Essential (primary) hypertension: Secondary | ICD-10-CM | POA: Diagnosis not present

## 2023-08-21 DIAGNOSIS — F17200 Nicotine dependence, unspecified, uncomplicated: Secondary | ICD-10-CM | POA: Insufficient documentation

## 2023-08-21 NOTE — ED Triage Notes (Signed)
 Patient complains left groin pain onset this morning after his ultrasound , ambulatory/respirations unlabored.

## 2023-08-21 NOTE — ED Provider Triage Note (Signed)
 Emergency Medicine Provider Triage Evaluation Note  Kevin Barton , a 63 y.o. male  was evaluated in triage.  Pt complains of left groin pain after attempting to get ultrasound study.  Extensive chart review, reports a similar visit yesterday where he could not get ultrasound study.  Also prior visit on June 21, and returns today after hours once again. States he [facility] the street but no records on his chart.   Review of Systems  Positive: Left leg swelling Negative: Fever,sob  Physical Exam  There were no vitals taken for this visit. Gen:   Awake, no distress   Resp:  Normal effort  MSK:   Moves extremities without difficulty  Other:    Medical Decision Making  Medically screening exam initiated at 9:15 PM.  Appropriate orders placed.  Ryoma Satre was informed that the remainder of the evaluation will be completed by another provider, this initial triage assessment does not replace that evaluation, and the importance of remaining in the ED until their evaluation is complete.     Bernise Sylvain, PA-C 08/21/23 2126

## 2023-08-22 MED ORDER — FUROSEMIDE 20 MG PO TABS
20.0000 mg | ORAL_TABLET | Freq: Once | ORAL | Status: AC
  Administered 2023-08-22: 20 mg via ORAL
  Filled 2023-08-22: qty 1

## 2023-08-22 MED ORDER — ACETAMINOPHEN 500 MG PO TABS
1000.0000 mg | ORAL_TABLET | Freq: Once | ORAL | Status: AC
  Administered 2023-08-22: 1000 mg via ORAL
  Filled 2023-08-22: qty 2

## 2023-08-22 MED ORDER — FUROSEMIDE 20 MG PO TABS
20.0000 mg | ORAL_TABLET | Freq: Every day | ORAL | 0 refills | Status: DC
Start: 2023-08-22 — End: 2024-01-10

## 2023-08-22 NOTE — ED Provider Notes (Signed)
 [place] EMERGENCY DEPARTMENT AT [facility] Provider Note   CSN: [id number] Arrival date & time: 08/21/23  2007     Patient presents with: Groin Pain    Kevin Barton is a 63 y.o. male.   HPI     This is a 63 year old male who presents with left groin pain.  Patient had a ultrasound earlier yesterday to rule out DVT of the left leg.  He has had some lower extremity swelling left greater than right.  He states that during the ultrasound study the ultrasonographer pressed firmly in the left groin and since that time he has had discomfort there.  Did not take anything for the pain.  Patient also states that he was not told what the results of his study were.  Denies chest pain or shortness of breath.  Prior to Admission medications   Medication Sig Start Date End Date Taking? Authorizing Provider  furosemide (LASIX) 20 MG tablet Take 1 tablet (20 mg total) by mouth daily. 08/22/23  Yes Joanmarie Tsang, Charmaine FALCON, MD  amLODipine  (NORVASC ) 5 MG tablet Take 1 tablet (5 mg total) by mouth daily. 11/14/21   Wonda Sharper, MD  amoxicillin -clavulanate (AUGMENTIN ) 875-125 MG tablet Take 1 tablet by mouth every 12 (twelve) hours. 07/28/23   Logan Ubaldo NOVAK, PA-C  aspirin  EC 81 MG tablet Take 1 tablet (81 mg total) by mouth 2 (two) times daily. To be taken after surgery to prevent blood clots if not on any other blood thinners Patient not taking: Reported on 03/18/2023 12/19/22 12/19/23  Jule Ronal CROME, PA-C  atorvastatin  (LIPITOR ) 80 MG tablet Take 1 tablet (80 mg total) by mouth daily at 6 PM. 11/14/21   Wonda Sharper, MD  BAYER LOW DOSE 81 MG tablet Take 81 mg by mouth daily. Swallow whole.    [provider]  BRILINTA  90 MG TABS tablet Take 90 mg by mouth in the morning.    [provider]  cyclobenzaprine  (FLEXERIL ) 10 MG tablet Take 1 tablet (10 mg total) by mouth 2 (two) times daily as needed for muscle spasms. 06/06/23   Ruthell Lonni FALCON, PA-C  docusate sodium   (COLACE) 100 MG capsule Take 1 capsule (100 mg total) by mouth daily as needed. Patient not taking: Reported on 03/18/2023 12/19/22 12/19/23  Jule Ronal CROME, PA-C  lidocaine  (LIDODERM ) 5 % Place 1 patch onto the skin daily. Remove & Discard patch within 12 hours or as directed by MD 06/06/23   Ruthell Lonni FALCON, PA-C  methocarbamol  (ROBAXIN -750) 750 MG tablet Take 1 tablet (750 mg total) by mouth 2 (two) times daily as needed for muscle spasms. Patient not taking: Reported on 03/18/2023 12/19/22   Jule Ronal CROME, PA-C  naproxen  (NAPROSYN ) 500 MG tablet Take 1 tablet (500 mg total) by mouth 2 (two) times daily. 06/06/23   Ruthell Lonni FALCON, PA-C  ondansetron  (ZOFRAN ) 4 MG tablet Take 1 tablet (4 mg total) by mouth every 8 (eight) hours as needed for nausea or vomiting. Patient not taking: Reported on 03/18/2023 12/19/22   Jule Ronal CROME, PA-C  oxyCODONE -acetaminophen  (PERCOCET) 5-325 MG tablet Take 1-2 tablets by mouth every 8 (eight) hours as needed. 06/08/23   Vicky Charleston, PA-C  Vitamin D, Ergocalciferol, (DRISDOL) 1.25 MG (50000 UNIT) CAPS capsule Take 50,000 Units by mouth once a week. 10/01/22   [provider]    Allergies: Patient has no known allergies.    Review of Systems  Constitutional:  Negative for fever.  Respiratory:  Negative for  shortness of breath.   Cardiovascular:  Positive for leg swelling. Negative for chest pain.  All other systems reviewed and are negative.   Updated Vital Signs BP (!) 152/73 (BP Location: Right Arm)   Pulse (!) 57   Temp 97.6 F (36.4 C) (Oral)   Resp 16   SpO2 100%   Physical Exam Vitals and nursing note reviewed.  Constitutional:      Appearance: He is well-developed.     Comments: Chronically ill-appearing but nontoxic  HENT:     Head: Normocephalic and atraumatic.   Eyes:     Pupils: Pupils are equal, round, and reactive to light.    Cardiovascular:     Rate and Rhythm: Normal rate and regular rhythm.   Pulmonary:     Effort: Pulmonary effort is normal. No respiratory distress.  Abdominal:     General: Bowel sounds are normal.     Palpations: Abdomen is soft.   Musculoskeletal:     Cervical back: Neck supple.     Comments: No obvious tenderness to palpation in the left groin, good femoral pulse, no thrill, distal DP pulses intact bilaterally, trace to 1+ bilateral lower extremity edema   Skin:    General: Skin is warm and dry.   Neurological:     Mental Status: He is alert and oriented to person, place, and time.   Psychiatric:        Mood and Affect: Mood normal.     (all labs ordered are listed, but only abnormal results are displayed) Labs Reviewed - No data to display  EKG: None  Radiology: LE VENOUS Result Date: 08/21/2023  Lower Venous DVT Study Patient Name:  Kevin Barton  Date of Exam:   08/21/2023 Medical Rec #: [id number]      Accession #:    [id number] Date of Birth: [birth year]      Patient Gender: M Patient Age:   89 years Exam Location:  [address] Procedure:      VAS US  LOWER EXTREMITY VENOUS (DVT) Referring Phys: LONNI LITES --------------------------------------------------------------------------------  Indications: Swelling, Pain, and Edema.  Risk Factors: None identified. Comparison Study: None. Performing Technologist: Garnette Rockers  Examination Guidelines: A complete evaluation includes B-mode imaging, spectral Doppler, color Doppler, and power Doppler as needed of all accessible portions of each vessel. Bilateral testing is considered an integral part of a complete examination. Limited examinations for reoccurring indications may be performed as noted. The reflux portion of the exam is performed with the patient in reverse Trendelenburg.  +-----+---------------+---------+-----------+----------+--------------+ RIGHTCompressibilityPhasicitySpontaneityPropertiesThrombus Aging +-----+---------------+---------+-----------+----------+--------------+  CFV  Full           Yes      Yes                                 +-----+---------------+---------+-----------+----------+--------------+   +---------+---------------+---------+-----------+----------+-------------------+ LEFT     CompressibilityPhasicitySpontaneityPropertiesThrombus Aging      +---------+---------------+---------+-----------+----------+-------------------+ CFV      Full           Yes      Yes                                      +---------+---------------+---------+-----------+----------+-------------------+ SFJ      Full                                                             +---------+---------------+---------+-----------+----------+-------------------+  FV Prox  Full                                                             +---------+---------------+---------+-----------+----------+-------------------+ FV Mid   Full                                                             +---------+---------------+---------+-----------+----------+-------------------+ FV DistalFull                    Yes                  Not well visualized +---------+---------------+---------+-----------+----------+-------------------+ PFV      Full                                                             +---------+---------------+---------+-----------+----------+-------------------+ POP      Full           Yes      Yes                                      +---------+---------------+---------+-----------+----------+-------------------+ PTV      Full                    Yes                                      +---------+---------------+---------+-----------+----------+-------------------+ PERO     Full                    Yes                  Not well visualized +---------+---------------+---------+-----------+----------+-------------------+   Left Technical Findings: Avascular fluid mass seen in Pop Fossa measuring 1.63 x 1.2 x 3.65 cm.    Summary: RIGHT: - No evidence of common femoral vein obstruction.   LEFT: - There is no evidence of deep vein thrombosis in the lower extremity.  - A cystic structure is found in the popliteal fossa. - Ultrasound characteristics of a ruptured Baker's Cyst are noted.  *See table(s) above for measurements and observations. Electronically signed by Penne Colorado MD on 08/21/2023 at 1:13:15 PM.    Final      Procedures   Medications Ordered in the ED  acetaminophen  (TYLENOL ) tablet 1,000 mg (has no administration in time range)  furosemide (LASIX) tablet 20 mg (has no administration in time range)                                    Medical Decision Making Risk OTC drugs. Prescription drug management.   This patient presents to the ED for concern of  leg pain and groin pain, this involves an extensive number of treatment options, and is a complaint that carries with it a high risk of complications and morbidity.  I considered the following differential and admission for this acute, potentially life threatening condition.  The differential diagnosis includes DVT, contusion, dependent edema  MDM:    This is a 63 year old male who presents with groin pain after ultrasound.  Also requesting ultrasound results.  He has no significant tenderness on exam.  Good femoral pulse.  Indicates that the pain was mostly with compression during ultrasound.  This is standard and not likely to cause significant injury.  He is neurologically intact.  Ultrasound imaging reviewed.  Does appear that he may have had a ruptured Baker's cyst.  He is on blood thinners and not a good candidate for anti-inflammatories.  Recommend Tylenol .  Additionally, he has bilateral lower extremity swelling which he states is new.  Will start on a low-dose of Lasix for the next 5 days to see if this helps at all with his discomfort.  (Labs, imaging, consults)  Labs: I Ordered, and personally interpreted labs.  The pertinent results  include: Labs reviewed from prior evaluation  Imaging Studies ordered: I ordered imaging studies including ultrasound I independently visualized and interpreted imaging. I agree with the radiologist interpretation  Additional history obtained from chart review.  External records from outside source obtained and reviewed including prior evaluations  Cardiac Monitoring: The patient was not maintained on a cardiac monitor.  If on the cardiac monitor, I personally viewed and interpreted the cardiac monitored which showed an underlying rhythm of: N/A  Reevaluation: After the interventions noted above, I reevaluated the patient and found that they have :stayed the same  Social Determinants of Health:  lives independently  Disposition: Discharge  Co morbidities that complicate the patient evaluation  Past Medical History:  Diagnosis Date   Arthritis    CAD (coronary artery disease)    cath 06/2016 occlusion of RCA with collateralization from the left coronary artery. Mild nonobstructive plaque in the left main, LAD, and LCx. LVEF 50%. TX Rx.    Chronic back pain    Chronic knee pain    HTN (hypertension)    Hyperlipidemia    NSTEMI (non-ST elevated myocardial infarction) (HCC)    Stroke (HCC)    Substance abuse (HCC)    Tobacco abuse      Medicines Meds ordered this encounter  Medications   acetaminophen  (TYLENOL ) tablet 1,000 mg   furosemide (LASIX) tablet 20 mg   furosemide (LASIX) 20 MG tablet    Sig: Take 1 tablet (20 mg total) by mouth daily.    Dispense:  5 tablet    Refill:  0    I have reviewed the patients home medicines and have made adjustments as needed  Problem List / ED Course: Problem List Items Addressed This Visit   None Visit Diagnoses       Leg swelling    -  Primary     Synovial cyst of left popliteal space                    Final diagnoses:  Leg swelling    ED Discharge Orders          Ordered    furosemide (LASIX) 20 MG tablet   Daily        08/22/23 0442               Lyna Laningham, Charmaine  F, MD 08/22/23 9551

## 2023-08-22 NOTE — Discharge Instructions (Addendum)
 You were seen today for groin pain after getting an ultrasound.  Your ultrasound did not show any evidence of blood clots.  You do have possibly have a ruptured Baker's cyst on the left which can cause some increased pain.  You have some edema or swelling of the lower legs that can sometimes cause tightness or discomfort.  Take Lasix for the next 5 days and follow-up with your primary doctor for recheck.

## 2023-08-23 ENCOUNTER — Encounter (HOSPITAL_COMMUNITY): Payer: Self-pay | Admitting: Interventional Radiology

## 2023-09-03 ENCOUNTER — Telehealth: Payer: Self-pay | Admitting: Orthopaedic Surgery

## 2023-09-03 NOTE — Telephone Encounter (Signed)
 Patient called and left message on voicemail stating he would like to schedule his knee surgery with Dr. Jerri.  Patient states he is clean and not using now.  Patient was scheduled for right knee replacement on 11/05/22 with Dr. Jerri but the surgery was cancelled due to testing positive for cocaine at his preadmission visit.  On 12/27/22 patient was scheduled,  but ate the morning of surgery and was again cancelled.  Patient was last scheduled for knee replacement on 12/31/22 but once again tested positive for cocaine.    I returned patient's call to [phone] but a recording comes on that says the call cannot be completed at this time.  Last office visit -08/21/22.  Patient will need to be seen in office prior to scheduling.  Will try to reach patient later.

## 2023-09-11 ENCOUNTER — Ambulatory Visit (INDEPENDENT_AMBULATORY_CARE_PROVIDER_SITE_OTHER): Admitting: Orthopaedic Surgery

## 2023-09-11 ENCOUNTER — Other Ambulatory Visit (INDEPENDENT_AMBULATORY_CARE_PROVIDER_SITE_OTHER): Payer: Self-pay

## 2023-09-11 VITALS — Ht 66.0 in | Wt 167.4 lb

## 2023-09-11 DIAGNOSIS — M1711 Unilateral primary osteoarthritis, right knee: Secondary | ICD-10-CM

## 2023-09-11 DIAGNOSIS — M1612 Unilateral primary osteoarthritis, left hip: Secondary | ICD-10-CM | POA: Diagnosis not present

## 2023-09-11 NOTE — Progress Notes (Signed)
 Office Visit Note   Patient: Kevin Barton           Date of Birth: [birth year]           MRN: [id number] Visit Date: 09/11/2023              Requested by: Center, [facility] [address] [place],  [place] [address] PCP: Center, [facility]   Assessment & Plan: Visit Diagnoses:  1. Primary osteoarthritis of right knee   2. Primary osteoarthritis of left hip     Plan: History of Present Illness Kevin Barton is a 63 year old male who presents for evaluation of severe right knee OA and severe left hip OA.  His knee replacement surgery was canceled last year due to positive cocaine on UDS.    He experiences swelling in his legs, particularly the left, with fluid accumulation. Diagnostic tests have ruled out blood clots. He is on medication to reduce the swelling. He is concerned about marks left on his knee and other areas due to the swelling.  He has a history of drug use but has been clean for approximately six months. He drinks one beer a day and smokes three cigarettes a day, which he is trying to reduce. He is currently living in a motel and plans to return there after his knee surgery. His sister will provide transportation to and from physical therapy sessions.  Physical Exam EXTREMITIES: Pitting edema in legs.  Right knee shows valgus deformity with lateral joint line tenderness and crepitus throughout range of motion.  Joint effusion present.  Antalgic gait.  Results RADIOLOGY Leg X-ray: No evidence of deep vein thrombosis or effusion noted. Hip X-ray: Left hip shows complete joint degeneration.  Assessment and Plan Impression is severe right knee degenerative joint disease secondary to Osteoarthritis.  Patient has attempted conservative treatment for at least 6 consecutive weeks within the past 12 weeks, including but not limited to physical therapy, home exercise program, NSAIDs, activity modification, and/or corticosteroid injections. Despite these efforts,  symptoms have not improved or have worsened. Conservative measures have been deemed unsuccessful at this time. After a detailed discussion covering diagnosis and treatment options--including the risks, benefits, alternatives, and potential complications of surgical and nonsurgical management--the patient elected to proceed with surgery  Anticoagulants: No antithrombotic Postop anticoagulation: Eliquis Diabetic: No  Nickel allergy: No Prior DVT/PE: No Tobacco use: Yes 3 cigs a day Clearances needed for surgery: None Anticipated discharge dispo: Home with sister   Left hip osteoarthritis Chronic osteoarthritis with significant degeneration. Future hip replacement planned. - Plan for future left hip replacement surgery.  Pitting edema in both legs - Prescribe compression socks for daytime use.  Follow-Up Instructions: No follow-ups on file.   Orders:  Orders Placed This Encounter  Procedures   XR KNEE 3 VIEW RIGHT   No orders of the defined types were placed in this encounter.     Procedures: No procedures performed   Clinical Data: No additional findings.   Subjective: Chief Complaint  Patient presents with   Right Knee - Pain    HPI  Review of Systems  Constitutional: Negative.   HENT: Negative.    Eyes: Negative.   Respiratory: Negative.    Cardiovascular: Negative.   Gastrointestinal: Negative.   Endocrine: Negative.   Genitourinary: Negative.   Skin: Negative.   Allergic/Immunologic: Negative.   Neurological: Negative.   Hematological: Negative.   Psychiatric/Behavioral: Negative.    All other systems reviewed and are negative.  Objective: Vital Signs: Ht 5' 6 (1.676 m)   Wt 167 lb 6.4 oz (75.9 kg)   BMI 27.02 kg/m   Physical Exam Vitals and nursing note reviewed.  Constitutional:      Appearance: He is well-developed.  HENT:     Head: Normocephalic and atraumatic.  Eyes:     Pupils: Pupils are equal, round, and reactive to light.   Pulmonary:     Effort: Pulmonary effort is normal.  Abdominal:     Palpations: Abdomen is soft.  Musculoskeletal:        General: Normal range of motion.     Cervical back: Neck supple.  Skin:    General: Skin is warm.  Neurological:     Mental Status: He is alert and oriented to person, place, and time.  Psychiatric:        Behavior: Behavior normal.        Thought Content: Thought content normal.        Judgment: Judgment normal.     Ortho Exam  Specialty Comments:  No specialty comments available.  Imaging: XR KNEE 3 VIEW RIGHT Result Date: 09/11/2023 X-rays of the right knee show severe tricompartment osteoarthritis with valgus deformity.    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 10/08/2017   Cocaine use 10/08/2017   Intracranial atherosclerosis 10/08/2017   Bradycardia 10/08/2017   Alcohol use 10/08/2017   Obesity (BMI 30.0-34.9) 10/08/2017   Acute blood loss anemia 10/08/2017   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 Mild nonobstructive  plaque in the left main, LAD, and LCx. LVEF 50%. TX Rx.    Chronic back pain    Chronic knee pain    HTN (hypertension)    Hyperlipidemia    NSTEMI (non-ST elevated myocardial infarction) (HCC)    Stroke (HCC)    Substance abuse (HCC)    Tobacco abuse     Family History  Problem Relation Age of Onset   Diabetes Brother     Past Surgical History:  Procedure Laterality Date   IR ANGIO INTRA EXTRACRAN SEL COM CAROTID INNOMINATE UNI L MOD SED  10/05/2017   IR ANGIO VERTEBRAL SEL SUBCLAVIAN INNOMINATE UNI R MOD SED  10/05/2017   IR CT HEAD LTD  10/05/2017   IR INTRAVSC STENT CERV CAROTID W/O EMB-PROT MOD SED INC ANGIO  10/05/2017   IR PERCUTANEOUS ART THROMBECTOMY/INFUSION INTRACRANIAL INC DIAG ANGIO  10/05/2017   LEFT HEART CATH AND CORONARY ANGIOGRAPHY N/A 07/25/2016   Procedure: Left Heart Cath and Coronary Angiography;  Surgeon: Wonda Sharper, MD;  Location: [facility] INVASIVE CV LAB;  Service: Cardiovascular;  Laterality: N/A;  RADIOLOGY WITH ANESTHESIA N/A 10/05/2017   Procedure: IR WITH ANESTHESIA CODE STROKE;  Surgeon: Dolphus Carrion, MD;  Location: [facility] OR;  Service: Radiology;  Laterality: N/A;   Social History   Occupational History   Not on file  Tobacco Use   Smoking status: Every Day    Current packs/day: 0.50    Types: Cigarettes   Smokeless tobacco: Never   Tobacco comments:    smoke 6 per week  Vaping Use   Vaping status: Never Used  Substance and Sexual Activity   Alcohol use: Yes    Comment: a beer a day   Drug use: Yes    Types: Marijuana   Sexual activity: Yes

## 2023-09-25 ENCOUNTER — Emergency Department (HOSPITAL_COMMUNITY)
Admission: EM | Admit: 2023-09-25 | Discharge: 2023-09-25 | Disposition: A | Discharge status: Home / Self Care | Attending: Emergency Medicine | Admitting: Emergency Medicine

## 2023-09-25 ENCOUNTER — Other Ambulatory Visit: Payer: Self-pay

## 2023-09-25 ENCOUNTER — Encounter (HOSPITAL_COMMUNITY): Payer: Self-pay | Admitting: *Deleted

## 2023-09-25 DIAGNOSIS — M1612 Unilateral primary osteoarthritis, left hip: Secondary | ICD-10-CM | POA: Insufficient documentation

## 2023-09-25 DIAGNOSIS — Z7982 Long term (current) use of aspirin: Secondary | ICD-10-CM | POA: Diagnosis not present

## 2023-09-25 DIAGNOSIS — M25552 Pain in left hip: Secondary | ICD-10-CM | POA: Diagnosis present

## 2023-09-25 MED ORDER — OXYCODONE-ACETAMINOPHEN 5-325 MG PO TABS
ORAL_TABLET | ORAL | Status: AC
  Filled 2023-09-25: qty 1

## 2023-09-25 MED ORDER — OXYCODONE-ACETAMINOPHEN 5-325 MG PO TABS
1.0000 | ORAL_TABLET | Freq: Once | ORAL | Status: AC
  Administered 2023-09-25: 1 via ORAL

## 2023-09-25 NOTE — ED Notes (Signed)
 Pt discharged from waiting room. Sandwich and drink given.

## 2023-09-25 NOTE — Discharge Instructions (Signed)
 You were seen for your left hip pain.  This is consistent with pain from your osteoarthritis.  You may take up to 1000mg  of tylenol  every 6 hours as needed for pain.  Do not take more then 4g per day.  You may use up to 600mg  ibuprofen  every 6 hours as needed for pain.  Do not exceed 2.4g of ibuprofen  per day.  Do not take this with other NSAID medications such as naproxen , Bayer, Goody powders  Please follow-up with your orthopedic doctor for further management.  It sounds like they are planning on a left hip replacement in the future to help with your pain.  Please return to the ER for any numbness in your left leg, increased swelling, any skin changes in your leg, any other new or concerning symptoms

## 2023-09-25 NOTE — ED Provider Notes (Signed)
 [place] EMERGENCY DEPARTMENT AT [facility] Provider Note   CSN: [id number] Arrival date & time: 09/25/23  2057     Patient presents with: Hip Pain   Kevin Barton is a 63 y.o. male history of osteoarthritis of the left hip, presents with concern for ongoing left hip pain.  States pain is a sharp pain that is consistent with his normal left hip pain.  No acute worsening.  Denies any new injuries or falls.  Denies any numbness or tingling in the lower extremities.  Denies any leg swelling.  He asked for sandwich upon arrival.    Hip Pain       Prior to Admission medications   Medication Sig Start Date End Date Taking? Authorizing Provider  amLODipine  (NORVASC ) 5 MG tablet Take 1 tablet (5 mg total) by mouth daily. 11/14/21   Wonda Sharper, MD  amoxicillin -clavulanate (AUGMENTIN ) 875-125 MG tablet Take 1 tablet by mouth every 12 (twelve) hours. 07/28/23   Logan Ubaldo NOVAK, PA-C  aspirin  EC 81 MG tablet Take 1 tablet (81 mg total) by mouth 2 (two) times daily. To be taken after surgery to prevent blood clots if not on any other blood thinners Patient not taking: Reported on 03/18/2023 12/19/22 12/19/23  Jule Ronal CROME, PA-C  atorvastatin  (LIPITOR ) 80 MG tablet Take 1 tablet (80 mg total) by mouth daily at 6 PM. 11/14/21   Wonda Sharper, MD  BAYER LOW DOSE 81 MG tablet Take 81 mg by mouth daily. Swallow whole.    [provider]  BRILINTA  90 MG TABS tablet Take 90 mg by mouth in the morning.    [provider]  cyclobenzaprine  (FLEXERIL ) 10 MG tablet Take 1 tablet (10 mg total) by mouth 2 (two) times daily as needed for muscle spasms. 06/06/23   Ruthell Lonni FALCON, PA-C  docusate sodium  (COLACE) 100 MG capsule Take 1 capsule (100 mg total) by mouth daily as needed. Patient not taking: Reported on 03/18/2023 12/19/22 12/19/23  Jule Ronal CROME, PA-C  furosemide  (LASIX ) 20 MG tablet Take 1 tablet (20 mg total) by mouth daily. 08/22/23   Horton, Charmaine FALCON,  MD  lidocaine  (LIDODERM ) 5 % Place 1 patch onto the skin daily. Remove & Discard patch within 12 hours or as directed by MD 06/06/23   Ruthell Lonni FALCON, PA-C  methocarbamol  (ROBAXIN -750) 750 MG tablet Take 1 tablet (750 mg total) by mouth 2 (two) times daily as needed for muscle spasms. Patient not taking: Reported on 03/18/2023 12/19/22   Jule Ronal CROME, PA-C  naproxen  (NAPROSYN ) 500 MG tablet Take 1 tablet (500 mg total) by mouth 2 (two) times daily. 06/06/23   Ruthell Lonni FALCON, PA-C  ondansetron  (ZOFRAN ) 4 MG tablet Take 1 tablet (4 mg total) by mouth every 8 (eight) hours as needed for nausea or vomiting. Patient not taking: Reported on 03/18/2023 12/19/22   Jule Ronal CROME, PA-C  oxyCODONE -acetaminophen  (PERCOCET) 5-325 MG tablet Take 1-2 tablets by mouth every 8 (eight) hours as needed. 06/08/23   Vicky Charleston, PA-C  Vitamin D, Ergocalciferol, (DRISDOL) 1.25 MG (50000 UNIT) CAPS capsule Take 50,000 Units by mouth once a week. 10/01/22   [provider]    Allergies: Patient has no known allergies.    Review of Systems  Musculoskeletal:        Left hip pain    Updated Vital Signs BP 137/69 (BP Location: Right Arm)   Pulse 70   Temp 98.7 F (37.1 C) (Oral)   Resp 16  Ht 5' 6 (1.676 m)   Wt 75.9 kg   SpO2 98%   BMI 27.01 kg/m   Physical Exam Vitals and nursing note reviewed.  Constitutional:      Appearance: Normal appearance.  HENT:     Head: Atraumatic.  Cardiovascular:     Rate and Rhythm: Normal rate and regular rhythm.     Comments: 1+ dorsalis pedis pulse bilaterally Pulmonary:     Effort: Pulmonary effort is normal.  Musculoskeletal:     Comments: Left pelvis, femur, tibia, fibula without any bony tenderness palpation.  No overlying skin change, erythema, or edema of the left lower extremity.  Calves soft and nontender bilaterally.  Full range of motion of the bilateral lower extremities.  Able to ambulate at baseline with cane.  Neurological:      General: No focal deficit present.     Mental Status: He is alert.  Psychiatric:        Mood and Affect: Mood normal.        Behavior: Behavior normal.     (all labs ordered are listed, but only abnormal results are displayed) Labs Reviewed - No data to display  EKG: None  Radiology: No results found.   Procedures   Medications Ordered in the ED  oxyCODONE -acetaminophen  (PERCOCET/ROXICET) 5-325 MG per tablet 1 tablet (1 tablet Oral Given 09/25/23 2152)                                    Medical Decision Making Risk Prescription drug management.     Differential diagnosis includes but is not limited to fracture, dislocation, osteoarthritis, avascular necrosis, septic arthritis, DVT, radiculopathy  ED Course:  Upon initial evaluation, patient is well-appearing, no acute distress.  Stable vitals.  Reporting chronic left hip pain.  No acute worsening.  No injuries.  No bony tenderness palpation.  No indication for x-ray imaging at this time as I have low concern for acute fracture or dislocation.  Pain has been the same as it has been for multiple months, no overlying skin change, low concern for avascular necrosis, septic arthritis.  Patient without any edema in the lower extremities bilaterally.  No calf tenderness to palpation.  Ultrasound of the left lower extremity performed only 1 month ago on 08/21/2023 revealed no DVT in the left lower extremity.  Patient without any change in symptoms.  Low concern for DVT.  He is neurovascularly intact in the bilateral lower extremities.  This seems consistent with his chronic left hip pain from osteoarthritis.  He has been seen by Ortho Dr. Jerri who is planning on the left hip replacement for management of pain due to failure of noninvasive therapies. He comes in asking for a sandwich.  I suspect there might be a component of malingering.  He states he was prescribed an opioid type medication for his pain and needs to pick this up.   Discussed that he can continue with ibuprofen  and Tylenol  at home as needed for pain.  Feel he stable and appropriate for discharge home this time.   Medications Given: Percocet   Impression: Chronic left hip pain from osteoarthritis  Disposition:  The patient was discharged home with instructions to continue taking Tylenol  and ibuprofen  as needed for pain. Follow-up with Dr. Jerri with orthopedics for further management of his left hip pain and ultimately left hip replacement as planned.  He understands he may not drink alcohol  or drive after taking the Percocet today.  Return precautions given.   Record Review: External records from outside source obtained and reviewed including ortho note from 7/16 with Dr. Jerri, where patient was seen for his chronic left hip pain from osteoarthritis and right knee pain.  It appears plan is to do both right knee replacement and left hip replacement in the recent future     This chart was dictated using voice recognition software, Dragon. Despite the best efforts of this provider to proofread and correct errors, errors may still occur which can change documentation meaning.       Final diagnoses:  Osteoarthritis of left hip, unspecified osteoarthritis type    ED Discharge Orders     None          Veta Palma, DEVONNA 09/25/23 2157    Mannie Pac T, DO 09/25/23 2312

## 2023-09-25 NOTE — ED Triage Notes (Signed)
 L hip pain- chronic in nature. Advanced arthritis noted in xray from March.

## 2023-09-25 NOTE — ED Triage Notes (Signed)
 Lt hip pain  tonight asking for food in triage

## 2023-10-01 ENCOUNTER — Emergency Department (HOSPITAL_COMMUNITY)
Admission: EM | Admit: 2023-10-01 | Discharge: 2023-10-02 | Disposition: A | Discharge status: Home / Self Care | Attending: Emergency Medicine | Admitting: Emergency Medicine

## 2023-10-01 ENCOUNTER — Other Ambulatory Visit: Payer: Self-pay

## 2023-10-01 DIAGNOSIS — F17290 Nicotine dependence, other tobacco product, uncomplicated: Secondary | ICD-10-CM | POA: Insufficient documentation

## 2023-10-01 DIAGNOSIS — Z7982 Long term (current) use of aspirin: Secondary | ICD-10-CM | POA: Diagnosis not present

## 2023-10-01 DIAGNOSIS — I10 Essential (primary) hypertension: Secondary | ICD-10-CM | POA: Insufficient documentation

## 2023-10-01 DIAGNOSIS — G8929 Other chronic pain: Secondary | ICD-10-CM | POA: Insufficient documentation

## 2023-10-01 DIAGNOSIS — Z79899 Other long term (current) drug therapy: Secondary | ICD-10-CM | POA: Diagnosis not present

## 2023-10-01 DIAGNOSIS — M25461 Effusion, right knee: Secondary | ICD-10-CM | POA: Insufficient documentation

## 2023-10-01 DIAGNOSIS — M25561 Pain in right knee: Secondary | ICD-10-CM | POA: Diagnosis not present

## 2023-10-01 NOTE — ED Triage Notes (Signed)
 Patient reports right knee pain after removing knee brace yesterday , denies injury /ambulatory .

## 2023-10-02 ENCOUNTER — Other Ambulatory Visit: Payer: Self-pay

## 2023-10-02 ENCOUNTER — Emergency Department (HOSPITAL_COMMUNITY)
Admission: EM | Admit: 2023-10-02 | Discharge: 2023-10-02 | Disposition: A | Discharge status: Home / Self Care | Attending: Emergency Medicine | Admitting: Emergency Medicine

## 2023-10-02 DIAGNOSIS — M25561 Pain in right knee: Secondary | ICD-10-CM | POA: Insufficient documentation

## 2023-10-02 DIAGNOSIS — Z7982 Long term (current) use of aspirin: Secondary | ICD-10-CM | POA: Insufficient documentation

## 2023-10-02 MED ORDER — OXYCODONE HCL 5 MG PO TABS
5.0000 mg | ORAL_TABLET | Freq: Once | ORAL | Status: DC
  Filled 2023-10-02: qty 1

## 2023-10-02 MED ORDER — ACETAMINOPHEN 500 MG PO TABS
1000.0000 mg | ORAL_TABLET | Freq: Once | ORAL | Status: DC
  Filled 2023-10-02: qty 2

## 2023-10-02 MED ORDER — OXYCODONE-ACETAMINOPHEN 5-325 MG PO TABS
1.0000 | ORAL_TABLET | Freq: Once | ORAL | Status: AC
  Administered 2023-10-02: 1 via ORAL
  Filled 2023-10-02: qty 1

## 2023-10-02 MED ORDER — IBUPROFEN 800 MG PO TABS
800.0000 mg | ORAL_TABLET | Freq: Once | ORAL | Status: DC
  Filled 2023-10-02: qty 1

## 2023-10-02 NOTE — ED Provider Notes (Signed)
 [place] EMERGENCY DEPARTMENT AT [facility] Provider Note   CSN: [id number] Arrival date & time: 10/01/23  2252     Patient presents with: Knee Pain   Kevin Barton is a 63 y.o. male.  Patient with past medical history significant for chronic knee pain, osteoarthritis of right knee with plans for arthroplasty, NSTEMI, hypertension presents to the emergency room complaining of right-sided knee pain.  He states he took the knee brace he had previously been given and threw it away at the recommendations of orthopedics.  He states he has had increased pain since getting greater than the brace a few days ago.  He tells me that his orthopedic surgeon was planning on arthroplasty for the right knee.  Chart review shows orthopedic plans for the same.  They initially plan to do this last year but due to a positive talk screen had to cancel the surgery.  Patient denies fevers, trauma to the area.  He endorses continued pain in the right knee consistent with his previous osteoarthritis.  He is ambulatory at the time of arrival.    Knee Pain      Prior to Admission medications   Medication Sig Start Date End Date Taking? Authorizing Provider  amLODipine  (NORVASC ) 5 MG tablet Take 1 tablet (5 mg total) by mouth daily. 11/14/21   Wonda Sharper, MD  amoxicillin -clavulanate (AUGMENTIN ) 875-125 MG tablet Take 1 tablet by mouth every 12 (twelve) hours. 07/28/23   Logan Ubaldo NOVAK, PA-C  aspirin  EC 81 MG tablet Take 1 tablet (81 mg total) by mouth 2 (two) times daily. To be taken after surgery to prevent blood clots if not on any other blood thinners Patient not taking: Reported on 03/18/2023 12/19/22 12/19/23  Jule Ronal CROME, PA-C  atorvastatin  (LIPITOR ) 80 MG tablet Take 1 tablet (80 mg total) by mouth daily at 6 PM. 11/14/21   Wonda Sharper, MD  BAYER LOW DOSE 81 MG tablet Take 81 mg by mouth daily. Swallow whole.    [provider]  BRILINTA  90 MG TABS tablet Take 90 mg by mouth  in the morning.    [provider]  cyclobenzaprine  (FLEXERIL ) 10 MG tablet Take 1 tablet (10 mg total) by mouth 2 (two) times daily as needed for muscle spasms. 06/06/23   Ruthell Lonni FALCON, PA-C  docusate sodium  (COLACE) 100 MG capsule Take 1 capsule (100 mg total) by mouth daily as needed. Patient not taking: Reported on 03/18/2023 12/19/22 12/19/23  Jule Ronal CROME, PA-C  furosemide  (LASIX ) 20 MG tablet Take 1 tablet (20 mg total) by mouth daily. 08/22/23   Horton, Charmaine FALCON, MD  lidocaine  (LIDODERM ) 5 % Place 1 patch onto the skin daily. Remove & Discard patch within 12 hours or as directed by MD 06/06/23   Ruthell Lonni FALCON, PA-C  methocarbamol  (ROBAXIN -750) 750 MG tablet Take 1 tablet (750 mg total) by mouth 2 (two) times daily as needed for muscle spasms. Patient not taking: Reported on 03/18/2023 12/19/22   Jule Ronal CROME, PA-C  naproxen  (NAPROSYN ) 500 MG tablet Take 1 tablet (500 mg total) by mouth 2 (two) times daily. 06/06/23   Ruthell Lonni FALCON, PA-C  ondansetron  (ZOFRAN ) 4 MG tablet Take 1 tablet (4 mg total) by mouth every 8 (eight) hours as needed for nausea or vomiting. Patient not taking: Reported on 03/18/2023 12/19/22   Jule Ronal CROME, PA-C  oxyCODONE -acetaminophen  (PERCOCET) 5-325 MG tablet Take 1-2 tablets by mouth every 8 (eight) hours as needed. 06/08/23  Vicky Charleston, PA-C  Vitamin D, Ergocalciferol, (DRISDOL) 1.25 MG (50000 UNIT) CAPS capsule Take 50,000 Units by mouth once a week. 10/01/22   [provider]    Allergies: Patient has no known allergies.    Review of Systems  Updated Vital Signs BP (!) 150/82 (BP Location: Left Arm)   Pulse (!) 59   Temp 97.8 F (36.6 C) (Temporal)   Resp 16   SpO2 100%   Physical Exam Vitals and nursing note reviewed.  HENT:     Head: Normocephalic and atraumatic.  Eyes:     Pupils: Pupils are equal, round, and reactive to light.  Pulmonary:     Effort: Pulmonary effort is normal. No respiratory  distress.  Musculoskeletal:        General: Swelling present. No tenderness, deformity or signs of injury. Normal range of motion.     Cervical back: Normal range of motion.     Comments: Patient with some mild swelling of the right knee.  He has normal range of motion overall.  He does complain of some pain with active range of motion but states this is consistent with his underlying osteoarthritis.  No warmth on the compared to contralateral side.  Skin:    General: Skin is dry.  Neurological:     Mental Status: He is alert.  Psychiatric:        Speech: Speech normal.        Behavior: Behavior normal.     (all labs ordered are listed, but only abnormal results are displayed) Labs Reviewed - No data to display  EKG: None  Radiology: No results found.   Procedures   Medications Ordered in the ED  oxyCODONE -acetaminophen  (PERCOCET/ROXICET) 5-325 MG per tablet 1 tablet (1 tablet Oral Given 10/02/23 0250)                                    Medical Decision Making Risk Prescription drug management.   This patient presents to the ED for concern of knee pain, this involves an extensive number of treatment options, and is a complaint that carries with it a high risk of complications and morbidity.  The differential diagnosis includes fracture, dislocation, soft tissue injury, others   Co morbidities / Chronic conditions that complicate the patient evaluation  Osteoarthritis   Additional history obtained:  Additional history obtained from EMR External records from outside source obtained and reviewed including orthopedic notes   Problem List / ED Course / Critical interventions / Medication management   I ordered medication including oxycodone  Reevaluation of the patient after these medicines showed that the patient improved   Social Determinants of Health:  Patient is a daily tobacco smoker   Test / Admission - Considered:  Patient's presentation consistent  with his underlying osteoarthritis.  He has no fever, grossly normal range of motion, no erythema or significant warmth.  No signs of septic joint at this time.  No trauma to suggest fracture or dislocation.  I see very little utility for imaging at this time.  Patient has plans for a total knee arthroplasty which would likely help alleviate the majority of his symptoms.  He will continue to follow with orthopedics for further management.  Knee sleeve provided at patient's request for further compression.  Return precautions provided.      Final diagnoses:  Chronic pain of right knee    ED Discharge Orders  None          Logan Ubaldo KATHEE DEVONNA 10/02/23 9656    Trine Raynell Moder, MD 10/02/23 [phone]

## 2023-10-02 NOTE — Discharge Instructions (Signed)
 Please continue your home medications and follow-up with orthopedic surgery for further management of your chronic right knee pain.  If you develop any life-threatening symptoms please return to the emergency department.

## 2023-10-02 NOTE — ED Notes (Signed)
 The pt is nasty as always cursing jerking his gown off he only come in here to eat  an almost every night occurance

## 2023-10-02 NOTE — ED Triage Notes (Signed)
 Patient reports chronic right knee pain , denies recent injury , seen here this morning for the same complaint.

## 2023-10-02 NOTE — ED Notes (Signed)
 All meds  scanned and given  not sure what is incomplete documentation

## 2023-10-02 NOTE — ED Provider Notes (Signed)
  EMERGENCY DEPARTMENT AT [facility] Provider Note   CSN: [id number] Arrival date & time: 10/02/23  8076     Patient presents with: Knee Pain   Kevin Barton is a 63 y.o. male.   63 yo M with chief complaints of right knee pain.  This been going on for some time.  He tells me is scheduled to have surgery done by an orthopedist.  Was actually seen overnight last night and she felt like he was still having severe pain and need to be reassessed.  He told me that he takes a bath and body for discomfort at home.  Does not seem to be helping.   Knee Pain      Prior to Admission medications   Medication Sig Start Date End Date Taking? Authorizing Provider  amLODipine  (NORVASC ) 5 MG tablet Take 1 tablet (5 mg total) by mouth daily. 11/14/21   Wonda Sharper, MD  amoxicillin -clavulanate (AUGMENTIN ) 875-125 MG tablet Take 1 tablet by mouth every 12 (twelve) hours. 07/28/23   Logan Ubaldo NOVAK, PA-C  aspirin  EC 81 MG tablet Take 1 tablet (81 mg total) by mouth 2 (two) times daily. To be taken after surgery to prevent blood clots if not on any other blood thinners Patient not taking: Reported on 03/18/2023 12/19/22 12/19/23  Jule Ronal CROME, PA-C  atorvastatin  (LIPITOR ) 80 MG tablet Take 1 tablet (80 mg total) by mouth daily at 6 PM. 11/14/21   Wonda Sharper, MD  BAYER LOW DOSE 81 MG tablet Take 81 mg by mouth daily. Swallow whole.    [provider]  BRILINTA  90 MG TABS tablet Take 90 mg by mouth in the morning.    [provider]  cyclobenzaprine  (FLEXERIL ) 10 MG tablet Take 1 tablet (10 mg total) by mouth 2 (two) times daily as needed for muscle spasms. 06/06/23   Ruthell Lonni FALCON, PA-C  docusate sodium  (COLACE) 100 MG capsule Take 1 capsule (100 mg total) by mouth daily as needed. Patient not taking: Reported on 03/18/2023 12/19/22 12/19/23  Jule Ronal CROME, PA-C  furosemide  (LASIX ) 20 MG tablet Take 1 tablet (20 mg total) by mouth daily. 08/22/23    Horton, Charmaine FALCON, MD  lidocaine  (LIDODERM ) 5 % Place 1 patch onto the skin daily. Remove & Discard patch within 12 hours or as directed by MD 06/06/23   Ruthell Lonni FALCON, PA-C  methocarbamol  (ROBAXIN -750) 750 MG tablet Take 1 tablet (750 mg total) by mouth 2 (two) times daily as needed for muscle spasms. Patient not taking: Reported on 03/18/2023 12/19/22   Jule Ronal CROME, PA-C  naproxen  (NAPROSYN ) 500 MG tablet Take 1 tablet (500 mg total) by mouth 2 (two) times daily. 06/06/23   Ruthell Lonni FALCON, PA-C  ondansetron  (ZOFRAN ) 4 MG tablet Take 1 tablet (4 mg total) by mouth every 8 (eight) hours as needed for nausea or vomiting. Patient not taking: Reported on 03/18/2023 12/19/22   Jule Ronal CROME, PA-C  oxyCODONE -acetaminophen  (PERCOCET) 5-325 MG tablet Take 1-2 tablets by mouth every 8 (eight) hours as needed. 06/08/23   Vicky Charleston, PA-C  Vitamin D, Ergocalciferol, (DRISDOL) 1.25 MG (50000 UNIT) CAPS capsule Take 50,000 Units by mouth once a week. 10/01/22   [provider]    Allergies: Patient has no known allergies.    Review of Systems  Updated Vital Signs BP (!) 178/88 (BP Location: Right Arm)   Pulse (!) 52   Temp 98.3 F (36.8 C) (Oral)   Resp 17  SpO2 100%   Physical Exam Vitals and nursing note reviewed.  Constitutional:      Appearance: He is well-developed.  HENT:     Head: Normocephalic and atraumatic.  Eyes:     Pupils: Pupils are equal, round, and reactive to light.  Neck:     Vascular: No JVD.  Cardiovascular:     Rate and Rhythm: Normal rate and regular rhythm.     Heart sounds: No murmur heard.    No friction rub. No gallop.  Pulmonary:     Effort: No respiratory distress.     Breath sounds: No wheezing.  Abdominal:     General: There is no distension.     Tenderness: There is no abdominal tenderness. There is no guarding or rebound.  Musculoskeletal:        General: Normal range of motion.     Cervical back: Normal range of motion  and neck supple.     Right lower leg: Edema present.     Left lower leg: Edema present.     Comments: 1+ lower extremity edema to the shins bilaterally.  Able to range the right knee without significant discomfort.  No erythema no warmth.  Skin:    Coloration: Skin is not pale.     Findings: No rash.  Neurological:     Mental Status: He is alert and oriented to person, place, and time.  Psychiatric:        Behavior: Behavior normal.     (all labs ordered are listed, but only abnormal results are displayed) Labs Reviewed - No data to display  EKG: None  Radiology: No results found.   Procedures   Medications Ordered in the ED  acetaminophen  (TYLENOL ) tablet 1,000 mg (has no administration in time range)  ibuprofen  (ADVIL ) tablet 800 mg (has no administration in time range)  oxyCODONE  (Oxy IR/ROXICODONE ) immediate release tablet 5 mg (has no administration in time range)                                    Medical Decision Making Risk OTC drugs. Prescription drug management.   63 yo M with a chief complaints of right knee pain.  This been bothering him for a while.  He says he is scheduled for a surgical procedure but needs something for pain now.  Will give a dose of pain medicine here.  He also would like something to eat.  Will have him follow-up with orthopedics.  9:51 PM:  I have discussed the diagnosis/risks/treatment options with the patient.  Evaluation and diagnostic testing in the emergency department does not suggest an emergent condition requiring admission or immediate intervention beyond what has been performed at this time.  They will follow up with PCP, Ortho. We also discussed returning to the ED immediately if new or worsening sx occur. We discussed the sx which are most concerning (e.g., sudden worsening pain, fever, inability to tolerate by mouth) that necessitate immediate return. Medications administered to the patient during their visit and any new  prescriptions provided to the patient are listed below.  Medications given during this visit Medications  acetaminophen  (TYLENOL ) tablet 1,000 mg (has no administration in time range)  ibuprofen  (ADVIL ) tablet 800 mg (has no administration in time range)  oxyCODONE  (Oxy IR/ROXICODONE ) immediate release tablet 5 mg (has no administration in time range)     The patient appears reasonably screen and/or stabilized for  discharge and I doubt any other medical condition or other [facility] requiring further screening, evaluation, or treatment in the ED at this time prior to discharge.       Final diagnoses:  Acute pain of right knee    ED Discharge Orders     None          Emil Share, DO 10/02/23 2151

## 2023-10-02 NOTE — Discharge Instructions (Signed)
 Take 4 over the counter ibuprofen tablets 3 times a day or 2 over-the-counter naproxen tablets twice a day for pain. Also take tylenol 1000mg (2 extra strength) four times a day.

## 2023-10-02 NOTE — Progress Notes (Signed)
 Orthopedic Tech Progress Note Patient Details:  Kevin Barton 1960/10/21 [id number]  Ortho Devices Type of Ortho Device: Knee Sleeve Ortho Device/Splint Interventions: Ordered, Application   Post Interventions Patient Tolerated: Well Instructions Provided: Care of device  [place] A Wonda 10/02/2023, 3:07 AM

## 2023-10-04 ENCOUNTER — Other Ambulatory Visit: Payer: Self-pay

## 2023-10-04 ENCOUNTER — Emergency Department (HOSPITAL_COMMUNITY)

## 2023-10-04 ENCOUNTER — Emergency Department (HOSPITAL_COMMUNITY)
Admission: EM | Admit: 2023-10-04 | Discharge: 2023-10-04 | Disposition: A | Discharge status: Home / Self Care | Attending: Emergency Medicine | Admitting: Emergency Medicine

## 2023-10-04 DIAGNOSIS — Z7982 Long term (current) use of aspirin: Secondary | ICD-10-CM | POA: Insufficient documentation

## 2023-10-04 DIAGNOSIS — M1990 Unspecified osteoarthritis, unspecified site: Secondary | ICD-10-CM | POA: Diagnosis not present

## 2023-10-04 DIAGNOSIS — M160 Bilateral primary osteoarthritis of hip: Secondary | ICD-10-CM | POA: Diagnosis not present

## 2023-10-04 DIAGNOSIS — I2510 Atherosclerotic heart disease of native coronary artery without angina pectoris: Secondary | ICD-10-CM | POA: Diagnosis not present

## 2023-10-04 DIAGNOSIS — Z79899 Other long term (current) drug therapy: Secondary | ICD-10-CM | POA: Insufficient documentation

## 2023-10-04 DIAGNOSIS — M1612 Unilateral primary osteoarthritis, left hip: Secondary | ICD-10-CM | POA: Diagnosis not present

## 2023-10-04 DIAGNOSIS — M25552 Pain in left hip: Secondary | ICD-10-CM | POA: Diagnosis not present

## 2023-10-04 MED ORDER — NAPROXEN 500 MG PO TABS
500.0000 mg | ORAL_TABLET | Freq: Once | ORAL | Status: AC
  Administered 2023-10-04: 500 mg via ORAL
  Filled 2023-10-04: qty 1

## 2023-10-04 MED ORDER — NAPROXEN 500 MG PO TABS: 500.0000 mg | ORAL_TABLET | Freq: Two times a day (BID) | ORAL | 0 refills | Status: DC

## 2023-10-04 MED ORDER — OXYCODONE-ACETAMINOPHEN 5-325 MG PO TABS
1.0000 | ORAL_TABLET | Freq: Once | ORAL | Status: AC
  Administered 2023-10-04: 1 via ORAL
  Filled 2023-10-04: qty 1

## 2023-10-04 NOTE — ED Notes (Signed)
 Alert, NAD, calm, interactive. No changes. To xray on stretcher.

## 2023-10-04 NOTE — ED Notes (Signed)
 EDP at [facility]

## 2023-10-04 NOTE — ED Notes (Addendum)
 Pt ambulated to ED this morning to and from bus. Delay in d/c. Awaiting efficacy of meds given, ~20 minutes.

## 2023-10-04 NOTE — ED Notes (Addendum)
 C/o L hip pain. Chronic OA. Denies fall or injury. Denies sx other than pain. Worse today. Seen and tx'd for the same in the past. BLE dependent edema significant.

## 2023-10-04 NOTE — ED Triage Notes (Signed)
 Pt reports ongoing left hip pain d/t arthritis. States he has been told he needs a hip replacement. No falls

## 2023-10-04 NOTE — ED Provider Notes (Signed)
 [place] EMERGENCY DEPARTMENT AT [facility] Provider Note   CSN: [id number] Arrival date & time: 10/04/23  9270     Patient presents with: Hip Pain   Kevin Barton is a 63 y.o. male.   HPI     63 year old male comes in with chief complaint of hip pain.  Patient has history of hyperlipidemia, CAD, stroke, substance use disorder.  He also has history of severe arthritis to his hip on the left side, and right knee arthritis.  Patient states that he is having severe pain in his hip.  He has had hip issues for more than a year.  Currently he is waiting for the orthopedic doctors to clear him for right knee replacement.  After the right knee is replaced, he will be eligible for left hip replacement.  Patient is having severe pain to his left side for the last 2 days.  Pain is worse with any kind of movement.  He denies any falls.  He denies any numbness, tingling, weakness, fevers, chills.  Patient takes over-the-counter  back and body pain medicine.  He states that he is awaiting a call from the orthopedist to schedule the right knee replacement at this time.  He does not have any history of injections to the left hip.  Prior to Admission medications   Medication Sig Start Date End Date Taking? Authorizing Provider  amLODipine  (NORVASC ) 5 MG tablet Take 1 tablet (5 mg total) by mouth daily. 11/14/21   Wonda Sharper, MD  amoxicillin -clavulanate (AUGMENTIN ) 875-125 MG tablet Take 1 tablet by mouth every 12 (twelve) hours. 07/28/23   Logan Ubaldo NOVAK, PA-C  aspirin  EC 81 MG tablet Take 1 tablet (81 mg total) by mouth 2 (two) times daily. To be taken after surgery to prevent blood clots if not on any other blood thinners Patient not taking: Reported on 03/18/2023 12/19/22 12/19/23  Jule Ronal CROME, PA-C  atorvastatin  (LIPITOR ) 80 MG tablet Take 1 tablet (80 mg total) by mouth daily at 6 PM. 11/14/21   Wonda Sharper, MD  BAYER LOW DOSE 81 MG tablet Take 81 mg by mouth daily.  Swallow whole.    [provider]  BRILINTA  90 MG TABS tablet Take 90 mg by mouth in the morning.    [provider]  cyclobenzaprine  (FLEXERIL ) 10 MG tablet Take 1 tablet (10 mg total) by mouth 2 (two) times daily as needed for muscle spasms. 06/06/23   Ruthell Lonni FALCON, PA-C  docusate sodium  (COLACE) 100 MG capsule Take 1 capsule (100 mg total) by mouth daily as needed. Patient not taking: Reported on 03/18/2023 12/19/22 12/19/23  Jule Ronal CROME, PA-C  furosemide  (LASIX ) 20 MG tablet Take 1 tablet (20 mg total) by mouth daily. 08/22/23   Horton, Charmaine FALCON, MD  lidocaine  (LIDODERM ) 5 % Place 1 patch onto the skin daily. Remove & Discard patch within 12 hours or as directed by MD 06/06/23   Ruthell Lonni FALCON, PA-C  methocarbamol  (ROBAXIN -750) 750 MG tablet Take 1 tablet (750 mg total) by mouth 2 (two) times daily as needed for muscle spasms. Patient not taking: Reported on 03/18/2023 12/19/22   Jule Ronal CROME, PA-C  naproxen  (NAPROSYN ) 500 MG tablet Take 1 tablet (500 mg total) by mouth 2 (two) times daily. 10/04/23   Charlyn Sora, MD  ondansetron  (ZOFRAN ) 4 MG tablet Take 1 tablet (4 mg total) by mouth every 8 (eight) hours as needed for nausea or vomiting. Patient not taking: Reported on 03/18/2023 12/19/22  Jule Ronal CROME, PA-C  oxyCODONE -acetaminophen  (PERCOCET) 5-325 MG tablet Take 1-2 tablets by mouth every 8 (eight) hours as needed. 06/08/23   Vicky Charleston, PA-C  Vitamin D, Ergocalciferol, (DRISDOL) 1.25 MG (50000 UNIT) CAPS capsule Take 50,000 Units by mouth once a week. 10/01/22   [provider]    Allergies: Patient has no known allergies.    Review of Systems  All other systems reviewed and are negative.   Updated Vital Signs BP 135/76   Pulse 61   Temp 97.6 F (36.4 C) (Oral)   Resp 16   SpO2 98%   Physical Exam Vitals and nursing note reviewed.  Constitutional:      Appearance: He is well-developed.  HENT:     Head: Atraumatic.   Cardiovascular:     Rate and Rhythm: Normal rate.  Pulmonary:     Effort: Pulmonary effort is normal.  Musculoskeletal:     Cervical back: Neck supple.     Left lower leg: Edema present.     Comments: Left leg is edematous.  Patient has tenderness to palpation of the left hip.  He is having difficulty with active leg raise secondary to pain.  Patient has discomfort and pain with passive leg raise.  He also has tenderness with internal and external rotation of the hip.  Skin:    General: Skin is warm.  Neurological:     Mental Status: He is alert and oriented to person, place, and time.     Cranial Nerves: No cranial nerve deficit.     Sensory: No sensory deficit.     (all labs ordered are listed, but only abnormal results are displayed) Labs Reviewed - No data to display  EKG: None  Radiology: DG Hip Unilat W or Wo Pelvis 2-3 Views Left Result Date: 10/04/2023 CLINICAL DATA:  hip pain, worse than usual. EXAM: DG HIP (WITH OR WITHOUT PELVIS) 2-3V LEFT COMPARISON:  04/30/2023. FINDINGS: Pelvis is intact with normal and symmetric sacroiliac joints. No acute fracture or dislocation. No aggressive osseous lesion. Visualized sacral arcuate lines are unremarkable. Unremarkable symphysis pubis. Redemonstration of severe degenerative changes of the left hip joint in the form of reduced joint space/ankylosis, subchondral sclerosis / cystic changes and osteophytosis. No significant interval change since the prior study. There are mild to moderate degenerative changes of right hip joint characterized by reduced joint space and osteophyte formation. No radiopaque foreign bodies. IMPRESSION: No acute osseous abnormality of the pelvis or left hip joint. Redemonstration of severe degenerative changes of the left hip joint. Electronically Signed   By: Ree Molt M.D.   On: 10/04/2023 09:16     Procedures   Medications Ordered in the ED  naproxen  (NAPROSYN ) tablet 500 mg (has no administration in  time range)  oxyCODONE -acetaminophen  (PERCOCET/ROXICET) 5-325 MG per tablet 1 tablet (1 tablet Oral Given 10/04/23 9161)                                    Medical Decision Making Amount and/or Complexity of Data Reviewed Radiology: ordered.  Risk Prescription drug management.   63 year old male with history of stroke, polysubstance use disease, CAD comes in with chief complaint of left hip pain.  He has pertinent past medical history of known left hip arthritis.  Patient indicates that his pain is consistent with his arthritis pain, it flared up 2 days ago.  He is unsure what caused the flareup.  On exam, he has left lower extremity swelling, tenderness with palpation of the left hip and difficulty with active leg raise because of pain.  Patient's gross sensory exam is normal.  He is able to raise the leg, but not able to keep it up more than 2 seconds.  There is no upper extremity weakness or numbness.  Patient's neuroexam otherwise is nonfocal besides disability in the left lower extremity, which patient attributes to pain.  I reviewed patient's records including previous x-rays.  Patient had concerns for avascular necrosis of the femoral head on the left side on previous x-ray.  Differential diagnosis considered for this patient includes worsening arthritis, avascular necrosis of the left femur, occult fracture.  Patient has marked swelling in the left lower extremity.  Patient states that this is not new.  I reviewed patient's records, he had ultrasound DVT in the left lower extremity which was normal and June.  I suspect this is just his chronic venous insufficiency.  Plan is to get x-ray of the hip.  Reassessment: I independently interpreted patient's hip x-ray, there is no evidence of dislocation or major fracture.  Patient will be ambulated.  If he is able to ambulate, he will be discharged.  On reassessment, patient states that following Percocet his pain is  better.   Final diagnoses:  Arthritis of left hip    ED Discharge Orders          Ordered    naproxen  (NAPROSYN ) 500 MG tablet  2 times daily        10/04/23 0937               Charlyn Sora, MD 10/04/23 [phone]

## 2023-10-04 NOTE — Discharge Instructions (Addendum)
 Please follow-up with your orthopedist as soon as possible for worsening arthritis related pain.  Ice the affected area.  Take the medications that are prescribed for symptom relief. Read the instructions provided.

## 2023-10-09 ENCOUNTER — Other Ambulatory Visit: Payer: Self-pay

## 2023-10-09 ENCOUNTER — Emergency Department (HOSPITAL_COMMUNITY)
Admission: EM | Admit: 2023-10-09 | Discharge: 2023-10-09 | Disposition: A | Discharge status: Home / Self Care | Attending: Emergency Medicine | Admitting: Emergency Medicine

## 2023-10-09 ENCOUNTER — Encounter (HOSPITAL_COMMUNITY): Payer: Self-pay

## 2023-10-09 DIAGNOSIS — M25559 Pain in unspecified hip: Secondary | ICD-10-CM | POA: Diagnosis not present

## 2023-10-09 DIAGNOSIS — Z7982 Long term (current) use of aspirin: Secondary | ICD-10-CM | POA: Insufficient documentation

## 2023-10-09 DIAGNOSIS — M25552 Pain in left hip: Secondary | ICD-10-CM | POA: Diagnosis not present

## 2023-10-09 MED ORDER — DEXAMETHASONE SODIUM PHOSPHATE 10 MG/ML IJ SOLN
10.0000 mg | Freq: Once | INTRAMUSCULAR | Status: AC
  Administered 2023-10-09 (×2): 10 mg via INTRAMUSCULAR
  Filled 2023-10-09: qty 1

## 2023-10-09 MED ORDER — OXYCODONE-ACETAMINOPHEN 5-325 MG PO TABS
1.0000 | ORAL_TABLET | Freq: Once | ORAL | Status: AC
  Administered 2023-10-09 (×2): 1 via ORAL
  Filled 2023-10-09: qty 1

## 2023-10-09 NOTE — ED Provider Notes (Signed)
 [place] EMERGENCY DEPARTMENT AT [facility] Provider Note   CSN: [id number] Arrival date & time: 10/09/23  9676     Patient presents with: Hip Pain   Kevin Barton is a 63 y.o. male.   Presents to the emergency department for evaluation of left hip pain.  Denies injury.  Patient has history of chronic bilateral hip problems secondary to arthritis.  He was told that he would need a left hip replacement in the past but has not followed up.       Prior to Admission medications   Medication Sig Start Date End Date Taking? Authorizing Provider  amLODipine  (NORVASC ) 5 MG tablet Take 1 tablet (5 mg total) by mouth daily. 11/14/21   Wonda Sharper, MD  amoxicillin -clavulanate (AUGMENTIN ) 875-125 MG tablet Take 1 tablet by mouth every 12 (twelve) hours. 07/28/23   Logan Ubaldo NOVAK, PA-C  aspirin  EC 81 MG tablet Take 1 tablet (81 mg total) by mouth 2 (two) times daily. To be taken after surgery to prevent blood clots if not on any other blood thinners Patient not taking: Reported on 03/18/2023 12/19/22 12/19/23  Jule Ronal CROME, PA-C  atorvastatin  (LIPITOR ) 80 MG tablet Take 1 tablet (80 mg total) by mouth daily at 6 PM. 11/14/21   Wonda Sharper, MD  BAYER LOW DOSE 81 MG tablet Take 81 mg by mouth daily. Swallow whole.    [provider]  BRILINTA  90 MG TABS tablet Take 90 mg by mouth in the morning.    [provider]  cyclobenzaprine  (FLEXERIL ) 10 MG tablet Take 1 tablet (10 mg total) by mouth 2 (two) times daily as needed for muscle spasms. 06/06/23   Ruthell Lonni FALCON, PA-C  docusate sodium  (COLACE) 100 MG capsule Take 1 capsule (100 mg total) by mouth daily as needed. Patient not taking: Reported on 03/18/2023 12/19/22 12/19/23  Jule Ronal CROME, PA-C  furosemide  (LASIX ) 20 MG tablet Take 1 tablet (20 mg total) by mouth daily. 08/22/23   Horton, Charmaine FALCON, MD  lidocaine  (LIDODERM ) 5 % Place 1 patch onto the skin daily. Remove & Discard patch within 12 hours  or as directed by MD 06/06/23   Ruthell Lonni FALCON, PA-C  methocarbamol  (ROBAXIN -750) 750 MG tablet Take 1 tablet (750 mg total) by mouth 2 (two) times daily as needed for muscle spasms. Patient not taking: Reported on 03/18/2023 12/19/22   Jule Ronal CROME, PA-C  naproxen  (NAPROSYN ) 500 MG tablet Take 1 tablet (500 mg total) by mouth 2 (two) times daily. 10/04/23   Charlyn Sora, MD  ondansetron  (ZOFRAN ) 4 MG tablet Take 1 tablet (4 mg total) by mouth every 8 (eight) hours as needed for nausea or vomiting. Patient not taking: Reported on 03/18/2023 12/19/22   Jule Ronal CROME, PA-C  oxyCODONE -acetaminophen  (PERCOCET) 5-325 MG tablet Take 1-2 tablets by mouth every 8 (eight) hours as needed. 06/08/23   Vicky Charleston, PA-C  Vitamin D, Ergocalciferol, (DRISDOL) 1.25 MG (50000 UNIT) CAPS capsule Take 50,000 Units by mouth once a week. 10/01/22   [provider]    Allergies: Patient has no known allergies.    Review of Systems  Updated Vital Signs BP (!) 126/100 (BP Location: Right Arm)   Pulse 64   Temp 97.9 F (36.6 C) (Oral)   Resp 18   Ht 5' 4 (1.626 m)   Wt 77.1 kg   SpO2 97% Comment: Simultaneous filing. User may not have seen previous data.  BMI 29.18 kg/m   Physical Exam Vitals  and nursing note reviewed.  Constitutional:      General: He is not in acute distress.    Appearance: He is well-developed.  HENT:     Head: Normocephalic and atraumatic.     Mouth/Throat:     Mouth: Mucous membranes are moist.  Eyes:     General: Vision grossly intact. Gaze aligned appropriately.     Extraocular Movements: Extraocular movements intact.     Conjunctiva/sclera: Conjunctivae normal.  Cardiovascular:     Rate and Rhythm: Normal rate and regular rhythm.     Pulses: Normal pulses.     Heart sounds: Normal heart sounds, S1 normal and S2 normal. No murmur heard.    No friction rub. No gallop.  Pulmonary:     Effort: Pulmonary effort is normal. No respiratory distress.      Breath sounds: Normal breath sounds.  Abdominal:     Palpations: Abdomen is soft.     Tenderness: There is no abdominal tenderness. There is no guarding or rebound.     Hernia: No hernia is present.  Musculoskeletal:        General: No swelling.     Cervical back: Full passive range of motion without pain, normal range of motion and neck supple. No pain with movement, spinous process tenderness or muscular tenderness. Normal range of motion.     Left hip: Tenderness present. Decreased range of motion.     Right lower leg: No edema.     Left lower leg: No edema.  Skin:    General: Skin is warm and dry.     Capillary Refill: Capillary refill takes less than 2 seconds.     Findings: No ecchymosis, erythema, lesion or wound.  Neurological:     Mental Status: He is alert and oriented to person, place, and time.     GCS: GCS eye subscore is 4. GCS verbal subscore is 5. GCS motor subscore is 6.     Cranial Nerves: Cranial nerves 2-12 are intact.     Sensory: Sensation is intact.     Motor: Motor function is intact. No weakness or abnormal muscle tone.     Coordination: Coordination is intact.  Psychiatric:        Mood and Affect: Mood normal.        Speech: Speech normal.        Behavior: Behavior normal.     (all labs ordered are listed, but only abnormal results are displayed) Labs Reviewed - No data to display  EKG: None  Radiology: No results found.   Procedures   Medications Ordered in the ED  dexamethasone  (DECADRON ) injection 10 mg (has no administration in time range)  oxyCODONE -acetaminophen  (PERCOCET/ROXICET) 5-325 MG per tablet 1 tablet (has no administration in time range)                                    Medical Decision Making  Patient presents with chronic hip pain.  Patient has been seen in the ED multiple times with similar complaints.  Patient did have a recent x-ray that showed fairly severe osteoarthritis of the left hip joint.  No concern for  infection or septic arthritis at this time.  He has mild tenderness in range of motion but range of motion is preserved.  Patient actually sleeping upon my initial evaluation.  He does not appear to be in any distress.  Vital signs are unremarkable.  He needs follow-up with orthopedics for ongoing treatment.     Final diagnoses:  Pain of left hip    ED Discharge Orders     None          Ingeborg Fite, Lonni PARAS, MD 10/09/23 [phone]

## 2023-10-09 NOTE — ED Triage Notes (Signed)
 Patient BIB [facility] due to left hip pain. Patient states has been going on for awhile but does not have transportation for follow up appointments. A&Ox4 VSS w/ EMS.

## 2023-10-16 ENCOUNTER — Telehealth: Payer: Self-pay

## 2023-10-16 DIAGNOSIS — Z79899 Other long term (current) drug therapy: Secondary | ICD-10-CM | POA: Insufficient documentation

## 2023-10-16 DIAGNOSIS — I10 Essential (primary) hypertension: Secondary | ICD-10-CM | POA: Insufficient documentation

## 2023-10-16 DIAGNOSIS — M25552 Pain in left hip: Secondary | ICD-10-CM | POA: Insufficient documentation

## 2023-10-16 DIAGNOSIS — Z7982 Long term (current) use of aspirin: Secondary | ICD-10-CM | POA: Diagnosis not present

## 2023-10-16 DIAGNOSIS — G894 Chronic pain syndrome: Secondary | ICD-10-CM

## 2023-10-16 NOTE — Progress Notes (Unsigned)
 Complex Care Management Note Care Guide Note  10/16/2023 Name: Kevin Barton MRN: [id number] DOB: 01-08-1961   Complex Care Management Outreach Attempts: An unsuccessful telephone outreach was attempted today to offer the patient information about available complex care management services.  Follow Up Plan:  Additional outreach attempts will be made to offer the patient complex care management information and services.   Encounter Outcome:  Patient hung up during call.  Dreama [facility]  [facility], [facility] VBCI Assistant Direct Dial: [phone]  Fax: [phone]

## 2023-10-17 ENCOUNTER — Other Ambulatory Visit: Payer: Self-pay

## 2023-10-17 ENCOUNTER — Emergency Department (HOSPITAL_COMMUNITY): Admission: EM | Admit: 2023-10-17 | Discharge: 2023-10-17 | Disposition: A | Discharge status: Home / Self Care

## 2023-10-17 ENCOUNTER — Encounter (HOSPITAL_COMMUNITY): Payer: Self-pay | Admitting: *Deleted

## 2023-10-17 DIAGNOSIS — M25552 Pain in left hip: Secondary | ICD-10-CM

## 2023-10-17 MED ORDER — OXYCODONE-ACETAMINOPHEN 5-325 MG PO TABS
1.0000 | ORAL_TABLET | Freq: Once | ORAL | Status: AC
  Administered 2023-10-17: 1 via ORAL
  Filled 2023-10-17: qty 1

## 2023-10-17 MED ORDER — KETOROLAC TROMETHAMINE 15 MG/ML IJ SOLN
15.0000 mg | Freq: Once | INTRAMUSCULAR | Status: AC
  Administered 2023-10-17: 15 mg via INTRAMUSCULAR
  Filled 2023-10-17: qty 1

## 2023-10-17 MED ORDER — NAPROXEN 500 MG PO TABS: 500.0000 mg | ORAL_TABLET | Freq: Two times a day (BID) | ORAL | 0 refills | Status: DC

## 2023-10-17 NOTE — Discharge Instructions (Addendum)
 Please take the naproxen  twice daily as needed for pain and follow-up with your doctor.  Return to the ER for worsening symptoms.

## 2023-10-17 NOTE — ED Provider Notes (Signed)
 [name] EMERGENCY DEPARTMENT AT [facility] Provider Note   CSN: [id number] Arrival date & time: 10/16/23  2346     Patient presents with: lt hip pain   Kevin Barton is a 63 y.o. male.   63 year old male with past medical history of hip osteoarthritis, hypertension, and hyperlipidemia presenting to the emergency department today with left hip pain.  This is more of a chronic issue for the patient.  He states this flares up from time to time.  He has been seen in the emergency department [facility] times for this in the past.  States he has been taking over-the-counter medications which have not been helping.  He came to the ER today for further evaluation regarding this due to ongoing symptoms.  He denies any fevers.  Denies any recent injuries.  He has had x-ray performed within the past month which did show severe osteoarthritis but no other acute findings.  He came to the emergency department today for further evaluation regarding this.        Prior to Admission medications   Medication Sig Start Date End Date Taking? Authorizing Provider  naproxen  (NAPROSYN ) 500 MG tablet Take 1 tablet (500 mg total) by mouth 2 (two) times daily. 10/17/23  Yes Ula Prentice SAUNDERS, MD  amLODipine  (NORVASC ) 5 MG tablet Take 1 tablet (5 mg total) by mouth daily. 11/14/21   Wonda Sharper, MD  amoxicillin -clavulanate (AUGMENTIN ) 875-125 MG tablet Take 1 tablet by mouth every 12 (twelve) hours. 07/28/23   Logan Ubaldo NOVAK, PA-C  aspirin  EC 81 MG tablet Take 1 tablet (81 mg total) by mouth 2 (two) times daily. To be taken after surgery to prevent blood clots if not on any other blood thinners Patient not taking: Reported on 03/18/2023 12/19/22 12/19/23  Jule Ronal CROME, PA-C  atorvastatin  (LIPITOR ) 80 MG tablet Take 1 tablet (80 mg total) by mouth daily at 6 PM. 11/14/21   Wonda Sharper, MD  BAYER LOW DOSE 81 MG tablet Take 81 mg by mouth daily. Swallow whole.    [provider]  BRILINTA  90  MG TABS tablet Take 90 mg by mouth in the morning.    [provider]  cyclobenzaprine  (FLEXERIL ) 10 MG tablet Take 1 tablet (10 mg total) by mouth 2 (two) times daily as needed for muscle spasms. 06/06/23   Ruthell Lonni FALCON, PA-C  docusate sodium  (COLACE) 100 MG capsule Take 1 capsule (100 mg total) by mouth daily as needed. Patient not taking: Reported on 03/18/2023 12/19/22 12/19/23  Jule Ronal CROME, PA-C  furosemide  (LASIX ) 20 MG tablet Take 1 tablet (20 mg total) by mouth daily. 08/22/23   Horton, Charmaine FALCON, MD  lidocaine  (LIDODERM ) 5 % Place 1 patch onto the skin daily. Remove & Discard patch within 12 hours or as directed by MD 06/06/23   Ruthell Lonni FALCON, PA-C  methocarbamol  (ROBAXIN -750) 750 MG tablet Take 1 tablet (750 mg total) by mouth 2 (two) times daily as needed for muscle spasms. Patient not taking: Reported on 03/18/2023 12/19/22   Jule Ronal CROME, PA-C  naproxen  (NAPROSYN ) 500 MG tablet Take 1 tablet (500 mg total) by mouth 2 (two) times daily. 10/04/23   Charlyn Sora, MD  ondansetron  (ZOFRAN ) 4 MG tablet Take 1 tablet (4 mg total) by mouth every 8 (eight) hours as needed for nausea or vomiting. Patient not taking: Reported on 03/18/2023 12/19/22   Jule Ronal CROME, PA-C  oxyCODONE -acetaminophen  (PERCOCET) 5-325 MG tablet Take 1-2 tablets by mouth every 8 (  eight) hours as needed. 06/08/23   Vicky Charleston, PA-C  Vitamin D, Ergocalciferol, (DRISDOL) 1.25 MG (50000 UNIT) CAPS capsule Take 50,000 Units by mouth once a week. 10/01/22   [provider]    Allergies: Patient has no known allergies.    Review of Systems  Musculoskeletal:        Left hip pain  All other systems reviewed and are negative.   Updated Vital Signs BP (!) 147/81   Pulse 84   Temp (!) 97.5 F (36.4 C)   Resp 16   Ht 5' 4 (1.626 m)   Wt 77.1 kg   SpO2 92%   BMI 29.18 kg/m   Physical Exam Vitals and nursing note reviewed.   Gen: NAD Eyes: PERRL, EOMI HEENT: no  oropharyngeal swelling Neck: trachea midline Resp: clear to auscultation bilaterally Card: RRR, no murmurs, rubs, or gallops Abd: nontender, nondistended Extremities: Patient is tender over the left hip with normal range of motion noted, no overlying erythema or warmth, no calf tenderness, there is trace edema bilaterally Vascular: 2+ radial pulses bilaterally, 2+ DP pulses bilaterally Neuro: No focal deficits, equal strength and sensation throughout the bilateral lower extremities Skin: no rashes Psyc: acting appropriately   (all labs ordered are listed, but only abnormal results are displayed) Labs Reviewed - No data to display  EKG: None  Radiology: No results found.   Procedures   Medications Ordered in the ED  ketorolac  (TORADOL ) 15 MG/ML injection 15 mg (has no administration in time range)  oxyCODONE -acetaminophen  (PERCOCET/ROXICET) 5-325 MG per tablet 1 tablet (has no administration in time range)                                    Medical Decision Making 63 year old male with past medical history of hypertension, hyperlipidemia, and left hip osteoarthritis presenting to the emergency department today with left hip pain.  This does seem to be more of an acute on chronic issue at this time.  Has had recent imaging.  Will hold off on repeating this.  I will treat the patient symptomatically with Toradol .  Have reviewed his most recent renal function which is within normal limits.  Will also give him a dose Percocet here.  I think that he is stable for discharge.  Suspicion for septic arthritis, fracture, or other acute pathology is low at this time given the chronicity and multiple presentations in the past with similar complaints.  Risk Prescription drug management.        Final diagnoses:  Left hip pain    ED Discharge Orders          Ordered    naproxen  (NAPROSYN ) 500 MG tablet  2 times daily        10/17/23 0721               Ula Prentice SAUNDERS,  MD 10/17/23 [phone]

## 2023-10-17 NOTE — ED Triage Notes (Signed)
 The pt has chronic lt hip  pain and he reports that the hip has just flared up as it does sometimes

## 2023-10-18 NOTE — Progress Notes (Signed)
 Complex Care Management Note  Care Guide Note 10/18/2023 Name: Kevin Barton MRN: [id number] DOB: 08-08-1960  Kevin Barton is a 63 y.o. year old male who sees [facility] for primary care. I reached out to [employer] by phone today to offer complex care management services.  Mr. Laban was given information about Complex Care Management services today including:   The Complex Care Management services include support from the care team which includes your Nurse Care Manager, Clinical Social Worker, or Pharmacist.  The Complex Care Management team is here to help remove barriers to the health concerns and goals most important to you. Complex Care Management services are voluntary, and the patient may decline or stop services at any time by request to their care team member.   Complex Care Management Consent Status: Patient did not agree to participate in complex care management services at this time.  Encounter Outcome:  Patient Refused  Dreama Agent [facility], [facility] VBCI Assistant Direct Dial: [phone]  Fax: [phone]

## 2023-10-23 ENCOUNTER — Encounter (HOSPITAL_COMMUNITY): Payer: Self-pay | Admitting: Emergency Medicine

## 2023-10-23 ENCOUNTER — Other Ambulatory Visit: Payer: Self-pay

## 2023-10-23 ENCOUNTER — Emergency Department (HOSPITAL_COMMUNITY)
Admission: EM | Admit: 2023-10-23 | Discharge: 2023-10-23 | Disposition: A | Discharge status: Home / Self Care | Attending: Emergency Medicine | Admitting: Emergency Medicine

## 2023-10-23 DIAGNOSIS — Z79899 Other long term (current) drug therapy: Secondary | ICD-10-CM | POA: Insufficient documentation

## 2023-10-23 DIAGNOSIS — I2510 Atherosclerotic heart disease of native coronary artery without angina pectoris: Secondary | ICD-10-CM | POA: Diagnosis not present

## 2023-10-23 DIAGNOSIS — M25559 Pain in unspecified hip: Secondary | ICD-10-CM | POA: Diagnosis not present

## 2023-10-23 DIAGNOSIS — Z7982 Long term (current) use of aspirin: Secondary | ICD-10-CM | POA: Insufficient documentation

## 2023-10-23 DIAGNOSIS — M25552 Pain in left hip: Secondary | ICD-10-CM | POA: Diagnosis not present

## 2023-10-23 MED ORDER — NAPROXEN 250 MG PO TABS
500.0000 mg | ORAL_TABLET | Freq: Once | ORAL | Status: DC
  Filled 2023-10-23: qty 2

## 2023-10-23 NOTE — ED Triage Notes (Signed)
 Per EMS, [place] up from gas station, has had a hip pain flair up for the past two hours. Is scheduled for a left hip replacement in October.    162/96 P 84 111 BG  100% RA

## 2023-10-23 NOTE — Discharge Instructions (Signed)
Please follow up with your orthopedic doctor.

## 2023-10-23 NOTE — ED Provider Notes (Signed)
 [place] EMERGENCY DEPARTMENT AT [facility] Provider Note   CSN: [id number] Arrival date & time: 10/23/23  0125     Patient presents with: Hip Pain   Kevin Barton is a 63 y.o. male with history of chronic back pain, chronic knee pain, chronic left hip pain, substance abuse, tobacco abuse, CAD and stroke.  Patient presents to ED for evaluation of left hip pain.  States that today his left hip locked up.  Denies trauma to his hip.  Denies any recent falls.  States this is very similar to past instances of hip pain.  Reports chronic left hip pain, states he has plans for hip replacement sometime in November or December.  Denies medications prior to arrival.  Denies fevers at home, nausea or vomiting.  Denies overlying skin change left hip.   Hip Pain       Prior to Admission medications   Medication Sig Start Date End Date Taking? Authorizing Provider  amLODipine  (NORVASC ) 5 MG tablet Take 1 tablet (5 mg total) by mouth daily. 11/14/21   Wonda Sharper, MD  amoxicillin -clavulanate (AUGMENTIN ) 875-125 MG tablet Take 1 tablet by mouth every 12 (twelve) hours. 07/28/23   Logan Ubaldo NOVAK, PA-C  aspirin  EC 81 MG tablet Take 1 tablet (81 mg total) by mouth 2 (two) times daily. To be taken after surgery to prevent blood clots if not on any other blood thinners Patient not taking: Reported on 03/18/2023 12/19/22 12/19/23  Jule Ronal CROME, PA-C  atorvastatin  (LIPITOR ) 80 MG tablet Take 1 tablet (80 mg total) by mouth daily at 6 PM. 11/14/21   Wonda Sharper, MD  BAYER LOW DOSE 81 MG tablet Take 81 mg by mouth daily. Swallow whole.    [provider]  BRILINTA  90 MG TABS tablet Take 90 mg by mouth in the morning.    [provider]  cyclobenzaprine  (FLEXERIL ) 10 MG tablet Take 1 tablet (10 mg total) by mouth 2 (two) times daily as needed for muscle spasms. 06/06/23   Ruthell Lonni FALCON, PA-C  docusate sodium  (COLACE) 100 MG capsule Take 1 capsule (100 mg total) by  mouth daily as needed. Patient not taking: Reported on 03/18/2023 12/19/22 12/19/23  Jule Ronal CROME, PA-C  furosemide  (LASIX ) 20 MG tablet Take 1 tablet (20 mg total) by mouth daily. 08/22/23   Horton, Charmaine FALCON, MD  lidocaine  (LIDODERM ) 5 % Place 1 patch onto the skin daily. Remove & Discard patch within 12 hours or as directed by MD 06/06/23   Ruthell Lonni FALCON, PA-C  methocarbamol  (ROBAXIN -750) 750 MG tablet Take 1 tablet (750 mg total) by mouth 2 (two) times daily as needed for muscle spasms. Patient not taking: Reported on 03/18/2023 12/19/22   Jule Ronal CROME, PA-C  naproxen  (NAPROSYN ) 500 MG tablet Take 1 tablet (500 mg total) by mouth 2 (two) times daily. 10/04/23   Charlyn Sora, MD  naproxen  (NAPROSYN ) 500 MG tablet Take 1 tablet (500 mg total) by mouth 2 (two) times daily. 10/17/23   Ula Prentice SAUNDERS, MD  ondansetron  (ZOFRAN ) 4 MG tablet Take 1 tablet (4 mg total) by mouth every 8 (eight) hours as needed for nausea or vomiting. Patient not taking: Reported on 03/18/2023 12/19/22   Jule Ronal CROME, PA-C  oxyCODONE -acetaminophen  (PERCOCET) 5-325 MG tablet Take 1-2 tablets by mouth every 8 (eight) hours as needed. 06/08/23   Vicky Charleston, PA-C  Vitamin D, Ergocalciferol, (DRISDOL) 1.25 MG (50000 UNIT) CAPS capsule Take 50,000 Units by mouth once a week.  10/01/22   [provider]    Allergies: Patient has no known allergies.    Review of Systems  Musculoskeletal:  Positive for arthralgias.  All other systems reviewed and are negative.   Updated Vital Signs BP (!) 145/74 (BP Location: Right Arm)   Pulse 84   Temp 97.8 F (36.6 C)   Resp 16   Ht 5' 4 (1.626 m)   Wt 77.6 kg   SpO2 98%   BMI 29.35 kg/m   Physical Exam Vitals and nursing note reviewed.  Constitutional:      General: He is not in acute distress.    Appearance: He is well-developed.  HENT:     Head: Normocephalic and atraumatic.  Eyes:     Conjunctiva/sclera: Conjunctivae normal.  Cardiovascular:      Rate and Rhythm: Normal rate and regular rhythm.     Heart sounds: No murmur heard. Pulmonary:     Effort: Pulmonary effort is normal. No respiratory distress.     Breath sounds: Normal breath sounds.  Abdominal:     Palpations: Abdomen is soft.     Tenderness: There is no abdominal tenderness.  Musculoskeletal:        General: No swelling.     Cervical back: Neck supple.     Comments: Full ROM left hip.  No deformity.  Nonfocal tenderness throughout.  Skin:    General: Skin is warm and dry.     Capillary Refill: Capillary refill takes less than 2 seconds.  Neurological:     Mental Status: He is alert and oriented to person, place, and time. Mental status is at baseline.  Psychiatric:        Mood and Affect: Mood normal.     (all labs ordered are listed, but only abnormal results are displayed) Labs Reviewed - No data to display  EKG: None  Radiology: No results found.   Procedures   Medications Ordered in the ED  naproxen  (NAPROSYN ) tablet 500 mg (500 mg Oral Incomplete 10/23/23 0202)     Medical Decision Making Risk Prescription drug management.   This is a 63 year old male who presents to the ED out of concern of chronic left hip pain.  Reports today he had a flareup.  Denies any trauma to his hip.  States that he is scheduled to have hip replacement in November or December.  On exam the patient is hemodynamically stable.  He is afebrile and nontachycardic.  His lung sounds are clear bilaterally, he is not hypoxic.  His abdomen is soft and compressible.  His neurological examination is at baseline.  His left hip has no warmth, erythema, deformity.  He has no shortened or rotated left lower extremity.  He has full ROM of his left hip without pain so doubt septic arthritis.  Patient has been seen numerous times for this hip pain.  He received x-ray imaging of his left hip on 10/04/2023 which was unremarkable.  He denies any trauma or new injury to his left hip so  I do not see indication for imaging tonight.  Patient was given naproxen , allowed to rest in the ED and discharged home.  He was advised to follow-up with his orthopedic surgeon for further management and he voiced understanding.  Stable to discharge home.    Final diagnoses:  Left hip pain    ED Discharge Orders     None          Ruthell Lonni FALCON, [place] 10/23/23 9380  Jerral Meth, MD 10/23/23 [id number]

## 2023-10-23 NOTE — ED Provider Triage Note (Signed)
 Emergency Medicine Provider Triage Evaluation Note  Kevin Barton , a 63 y.o. male  was evaluated in triage.  Pt complains of left hip pain.  States he has chronic left hip pain but today his pain worsened, flared up.  He denies any trauma, falls to his hip.  States this is his chronic hip pain he often deals with.  Reports he is set up for hip replacement sometime in October or November.  Denies any other concerns.  Denies any medications PTA.  Review of Systems  Positive:  Negative:   Physical Exam  BP (!) 145/74 (BP Location: Right Arm)   Pulse 84   Temp 97.8 F (36.6 C)   Resp 16   Ht 5' 4 (1.626 m)   Wt 77.6 kg   SpO2 98%   BMI 29.35 kg/m  Gen:   Awake, no distress   Resp:  Normal effort  MSK:   Moves extremities without difficulty  Other:  Reduced ROM left hip, no shortening, no rotation of the left lower extremity  Medical Decision Making  Medically screening exam initiated at 2:00 AM.  Appropriate orders placed.  Carles Jasko was informed that the remainder of the evaluation will be completed by another provider, this initial triage assessment does not replace that evaluation, and the importance of remaining in the ED until their evaluation is complete.  Patient here for chronic left hip pain.  Denies any trauma, falls today.  Has had imaging of his left hip as recently as 8/8.  No indications for imaging at this time.  Given naproxen .   Jayvian Escoe F, PA-C 10/23/23 0201

## 2023-10-23 NOTE — ED Notes (Signed)
 PA-C provided patient with sandwhich bag and drink.

## 2023-10-23 NOTE — ED Notes (Addendum)
 This RN reviewed discharge instructions with patient. he verbalized understanding and denied any further questions. PT well appearing upon discharge and reports 10/10 pain. Pt ambulated with cane to exit. Pt reports that I'm just gonna go to [place] Provided patient with bus pass.

## 2023-11-03 ENCOUNTER — Encounter (HOSPITAL_COMMUNITY): Payer: Self-pay | Admitting: Emergency Medicine

## 2023-11-03 ENCOUNTER — Emergency Department (HOSPITAL_COMMUNITY)
Admission: EM | Admit: 2023-11-03 | Discharge: 2023-11-03 | Disposition: A | Discharge status: Home / Self Care | Attending: Emergency Medicine | Admitting: Emergency Medicine

## 2023-11-03 DIAGNOSIS — M25561 Pain in right knee: Secondary | ICD-10-CM | POA: Diagnosis not present

## 2023-11-03 DIAGNOSIS — M25552 Pain in left hip: Secondary | ICD-10-CM | POA: Diagnosis present

## 2023-11-03 DIAGNOSIS — Z7982 Long term (current) use of aspirin: Secondary | ICD-10-CM | POA: Insufficient documentation

## 2023-11-03 DIAGNOSIS — M25551 Pain in right hip: Secondary | ICD-10-CM | POA: Insufficient documentation

## 2023-11-03 MED ORDER — HYDROCODONE-ACETAMINOPHEN 5-325 MG PO TABS
1.0000 | ORAL_TABLET | Freq: Once | ORAL | Status: AC
  Administered 2023-11-03: 1 via ORAL
  Filled 2023-11-03: qty 1

## 2023-11-03 MED ORDER — NAPROXEN 500 MG PO TABS: 500.0000 mg | ORAL_TABLET | Freq: Two times a day (BID) | ORAL | 0 refills | Status: DC

## 2023-11-03 NOTE — ED Provider Notes (Signed)
 [place] EMERGENCY DEPARTMENT AT [facility] Provider Note   CSN: [id number] Arrival date & time: 11/03/23  1819     Patient presents with: Hip Pain   Kevin Barton is a 63 y.o. male.    Hip Pain   Patient presented to the ED for evaluation of left hip pain.  Patient states he has history of arthritis.  He felt a catch in his hip today.  He denies any falls he is not having any fevers.  Patient states his arthritis will flareup with the weather changes.  Patient has been seen several times in the past for this same condition.  He plans to follow-up with an orthopedic doctor.    Prior to Admission medications   Medication Sig Start Date End Date Taking? Authorizing Provider  amLODipine  (NORVASC ) 5 MG tablet Take 1 tablet (5 mg total) by mouth daily. 11/14/21   Wonda Sharper, MD  amoxicillin -clavulanate (AUGMENTIN ) 875-125 MG tablet Take 1 tablet by mouth every 12 (twelve) hours. 07/28/23   Logan Ubaldo NOVAK, PA-C  aspirin  EC 81 MG tablet Take 1 tablet (81 mg total) by mouth 2 (two) times daily. To be taken after surgery to prevent blood clots if not on any other blood thinners Patient not taking: Reported on 03/18/2023 12/19/22 12/19/23  Jule Ronal CROME, PA-C  atorvastatin  (LIPITOR ) 80 MG tablet Take 1 tablet (80 mg total) by mouth daily at 6 PM. 11/14/21   Wonda Sharper, MD  BAYER LOW DOSE 81 MG tablet Take 81 mg by mouth daily. Swallow whole.    [provider]  BRILINTA  90 MG TABS tablet Take 90 mg by mouth in the morning.    [provider]  cyclobenzaprine  (FLEXERIL ) 10 MG tablet Take 1 tablet (10 mg total) by mouth 2 (two) times daily as needed for muscle spasms. 06/06/23   Ruthell Lonni FALCON, PA-C  docusate sodium  (COLACE) 100 MG capsule Take 1 capsule (100 mg total) by mouth daily as needed. Patient not taking: Reported on 03/18/2023 12/19/22 12/19/23  Jule Ronal CROME, PA-C  furosemide  (LASIX ) 20 MG tablet Take 1 tablet (20 mg total) by mouth  daily. 08/22/23   Horton, Charmaine FALCON, MD  lidocaine  (LIDODERM ) 5 % Place 1 patch onto the skin daily. Remove & Discard patch within 12 hours or as directed by MD 06/06/23   Ruthell Lonni FALCON, PA-C  methocarbamol  (ROBAXIN -750) 750 MG tablet Take 1 tablet (750 mg total) by mouth 2 (two) times daily as needed for muscle spasms. Patient not taking: Reported on 03/18/2023 12/19/22   Jule Ronal CROME, PA-C  naproxen  (NAPROSYN ) 500 MG tablet Take 1 tablet (500 mg total) by mouth 2 (two) times daily. 11/03/23   Randol Simmonds, MD  ondansetron  (ZOFRAN ) 4 MG tablet Take 1 tablet (4 mg total) by mouth every 8 (eight) hours as needed for nausea or vomiting. Patient not taking: Reported on 03/18/2023 12/19/22   Jule Ronal CROME, PA-C  oxyCODONE -acetaminophen  (PERCOCET) 5-325 MG tablet Take 1-2 tablets by mouth every 8 (eight) hours as needed. 06/08/23   Vicky Charleston, PA-C  Vitamin D, Ergocalciferol, (DRISDOL) 1.25 MG (50000 UNIT) CAPS capsule Take 50,000 Units by mouth once a week. 10/01/22   [provider]    Allergies: Patient has no known allergies.    Review of Systems  Updated Vital Signs BP 128/67 (BP Location: Left Arm)   Pulse 70   Temp 98.2 F (36.8 C)   Resp 16   SpO2 96%   Physical Exam  Vitals and nursing note reviewed.  Constitutional:      General: He is not in acute distress.    Appearance: He is well-developed.  HENT:     Head: Normocephalic and atraumatic.     Right Ear: External ear normal.     Left Ear: External ear normal.  Eyes:     General: No scleral icterus.       Right eye: No discharge.        Left eye: No discharge.     Conjunctiva/sclera: Conjunctivae normal.  Neck:     Trachea: No tracheal deviation.  Cardiovascular:     Rate and Rhythm: Normal rate.  Pulmonary:     Effort: Pulmonary effort is normal. No respiratory distress.     Breath sounds: No stridor.  Abdominal:     General: There is no distension.  Musculoskeletal:        General: No swelling,  tenderness or deformity.     Cervical back: Neck supple.     Comments: No swelling, no edema noted left hip, mild discomfort with range of motion  Skin:    General: Skin is warm and dry.     Findings: No rash.  Neurological:     Mental Status: He is alert. Mental status is at baseline.     Cranial Nerves: No dysarthria or facial asymmetry.     Motor: No seizure activity.     (all labs ordered are listed, but only abnormal results are displayed) Labs Reviewed - No data to display  EKG: None  Radiology: No results found.   Procedures   Medications Ordered in the ED  HYDROcodone -acetaminophen  (NORCO/VICODIN) 5-325 MG per tablet 1 tablet (has no administration in time range)                                    Medical Decision Making Problems Addressed: Right hip pain: chronic illness or injury with exacerbation, progression, or side effects of treatment  Risk Prescription drug management.   Patient has been seen several times in the past for the same issue.  He has had x-rays recently.  There is no recent trauma or fall.  No signs of infection.  I do not feel that repeat imaging is necessary.  Encourage patient to follow-up with his orthopedist as planned.     Final diagnoses:  Right hip pain    ED Discharge Orders          Ordered    naproxen  (NAPROSYN ) 500 MG tablet  2 times daily        11/03/23 2310               Randol Simmonds, MD 11/03/23 2311

## 2023-11-03 NOTE — Discharge Instructions (Signed)
 Continue your medications for your hip pain.  Follow-up with your orthopedic doctor to be rechecked.

## 2023-11-03 NOTE — ED Triage Notes (Signed)
 Pt here from home with c/o left hip and right knee pain , pt has been seen same recently

## 2023-11-17 ENCOUNTER — Other Ambulatory Visit: Payer: Self-pay

## 2023-11-17 ENCOUNTER — Emergency Department (HOSPITAL_COMMUNITY)
Admission: EM | Admit: 2023-11-17 | Discharge: 2023-11-17 | Disposition: A | Discharge status: Home / Self Care | Attending: Emergency Medicine | Admitting: Emergency Medicine

## 2023-11-17 ENCOUNTER — Encounter (HOSPITAL_COMMUNITY): Payer: Self-pay

## 2023-11-17 ENCOUNTER — Emergency Department (HOSPITAL_COMMUNITY)

## 2023-11-17 DIAGNOSIS — M25552 Pain in left hip: Secondary | ICD-10-CM | POA: Insufficient documentation

## 2023-11-17 DIAGNOSIS — Z79899 Other long term (current) drug therapy: Secondary | ICD-10-CM | POA: Insufficient documentation

## 2023-11-17 DIAGNOSIS — G8929 Other chronic pain: Secondary | ICD-10-CM | POA: Insufficient documentation

## 2023-11-17 DIAGNOSIS — I2510 Atherosclerotic heart disease of native coronary artery without angina pectoris: Secondary | ICD-10-CM | POA: Insufficient documentation

## 2023-11-17 DIAGNOSIS — I10 Essential (primary) hypertension: Secondary | ICD-10-CM | POA: Diagnosis not present

## 2023-11-17 DIAGNOSIS — Z7982 Long term (current) use of aspirin: Secondary | ICD-10-CM | POA: Insufficient documentation

## 2023-11-17 DIAGNOSIS — M160 Bilateral primary osteoarthritis of hip: Secondary | ICD-10-CM | POA: Diagnosis not present

## 2023-11-17 DIAGNOSIS — M25551 Pain in right hip: Secondary | ICD-10-CM | POA: Diagnosis not present

## 2023-11-17 MED ORDER — OXYCODONE HCL 5 MG PO TABS
5.0000 mg | ORAL_TABLET | Freq: Once | ORAL | Status: AC
  Administered 2023-11-17: 5 mg via ORAL
  Filled 2023-11-17: qty 1

## 2023-11-17 NOTE — Discharge Instructions (Addendum)
 It was a pleasure taking care of you today.  Based on your history, physical exam, and imaging I feel you are safe for discharge.  Today the most likely diagnosis is an acute flare of your chronic osteoarthritis of your left hip.  You have been given a dose of oxycodone  here in the emergency department to help with this acute flare.  Unfortunately I do not feel comfortable prescribing you anti-inflammatory medication as you have been on naproxen  for approximately a year and you are on a blood thinning medication as well.  Please follow-up with your primary care provider and orthopedic provider, they may prescribe this anti-inflammatory medication if they see it is fit.  Recommend follow-up as scheduled with your orthopedic provider regarding your left hip pain and potential hip replacement later this year.  Please continue to monitor symptoms and return to the emergency department if you experience any of the following symptoms including but not limited to fever, chills, chest pain, shortness of breath, severe hip pain, inability to walk, unexplained weakness, numbness/tingling, skin changes surrounding her left hip, trauma/injury, or other concerning symptom.  If symptoms persist or worsen recommend follow-up with your primary care provider or orthopedic provider within 48 hours.  You may try taking over-the-counter Tylenol  arthritis to help with hip pain as well.  Please keep in mind that the max daily dose of Tylenol  is 4000 mg/day, please do not exceed this limit.

## 2023-11-17 NOTE — ED Triage Notes (Signed)
 Pt came in d/t Rt hip pain that restarted this morning, states the weather was bad & he feels like it is out. Ambulating with cane upon arrival.

## 2023-11-17 NOTE — ED Provider Notes (Signed)
 [place] EMERGENCY DEPARTMENT AT [facility] Provider Note   CSN: [id number] Arrival date & time: 11/17/23  1815     Patient presents with: Hip Pain   Kevin Barton is a 63 y.o. male who presents to the emergency department with a chief complaint of left hip pain.  Patient states that he has been to the emergency department multiple times for left hip pain and has known osteoarthritis, he is scheduled for a hip replacement coming up in November or December per the patient.  He is also getting a right knee replacement in approximately a month per chart review.  Patient denies fever, chills, denies trauma/injury.  Patient states that his left hip flares up sometimes with the weather.  He states that since it was a little bit colder this morning he believes this is why he is in pain.  Patient states that he attempts to take aspirin  but does not get any relief.  States previously that he has been given oxycodone  as well as naproxen  and that both of these medications help. Patient denies taking tylenol . Patient states that he is on Brilinta  for a previous stroke and is compliant.  Patient is currently ambulatory with cane, at baseline he does not use a cane but states that he uses it sometimes when his left hip flares up.  Past medical history significant for NSTEMI, coronary artery disease, hypertension, hyperlipidemia, carotid artery occlusion, CVA, polysubstance abuse, etc.  To clarify triage note states that patient was having right hip pain, confirmed with the patient that pain today is on the left side.    Hip Pain       Prior to Admission medications   Medication Sig Start Date End Date Taking? Authorizing Provider  amLODipine  (NORVASC ) 5 MG tablet Take 1 tablet (5 mg total) by mouth daily. 11/14/21   Wonda Sharper, MD  amoxicillin -clavulanate (AUGMENTIN ) 875-125 MG tablet Take 1 tablet by mouth every 12 (twelve) hours. 07/28/23   Logan Ubaldo KATHEE DEVONNA  aspirin  EC 81 MG  tablet Take 1 tablet (81 mg total) by mouth 2 (two) times daily. To be taken after surgery to prevent blood clots if not on any other blood thinners Patient not taking: Reported on 03/18/2023 12/19/22 12/19/23  Jule Ronal CROME, PA-C  atorvastatin  (LIPITOR ) 80 MG tablet Take 1 tablet (80 mg total) by mouth daily at 6 PM. 11/14/21   Wonda Sharper, MD  BAYER LOW DOSE 81 MG tablet Take 81 mg by mouth daily. Swallow whole.    [provider]  BRILINTA  90 MG TABS tablet Take 90 mg by mouth in the morning.    [provider]  cyclobenzaprine  (FLEXERIL ) 10 MG tablet Take 1 tablet (10 mg total) by mouth 2 (two) times daily as needed for muscle spasms. 06/06/23   Ruthell Lonni FALCON, PA-C  docusate sodium  (COLACE) 100 MG capsule Take 1 capsule (100 mg total) by mouth daily as needed. Patient not taking: Reported on 03/18/2023 12/19/22 12/19/23  Jule Ronal CROME, PA-C  furosemide  (LASIX ) 20 MG tablet Take 1 tablet (20 mg total) by mouth daily. 08/22/23   Horton, Charmaine FALCON, MD  lidocaine  (LIDODERM ) 5 % Place 1 patch onto the skin daily. Remove & Discard patch within 12 hours or as directed by MD 06/06/23   Ruthell Lonni FALCON, PA-C  methocarbamol  (ROBAXIN -750) 750 MG tablet Take 1 tablet (750 mg total) by mouth 2 (two) times daily as needed for muscle spasms. Patient not taking: Reported on 03/18/2023 12/19/22  Jule Ronal CROME, PA-C  naproxen  (NAPROSYN ) 500 MG tablet Take 1 tablet (500 mg total) by mouth 2 (two) times daily. 11/03/23   Randol Simmonds, MD  ondansetron  (ZOFRAN ) 4 MG tablet Take 1 tablet (4 mg total) by mouth every 8 (eight) hours as needed for nausea or vomiting. Patient not taking: Reported on 03/18/2023 12/19/22   Jule Ronal CROME, PA-C  oxyCODONE -acetaminophen  (PERCOCET) 5-325 MG tablet Take 1-2 tablets by mouth every 8 (eight) hours as needed. 06/08/23   Vicky Charleston, PA-C  Vitamin D, Ergocalciferol, (DRISDOL) 1.25 MG (50000 UNIT) CAPS capsule Take 50,000 Units by mouth once a  week. 10/01/22   [provider]    Allergies: Patient has no known allergies.    Review of Systems  Musculoskeletal:  Positive for arthralgias (Left hip pain).    Updated Vital Signs BP 139/74   Pulse 86   Temp 98.3 F (36.8 C) (Oral)   Resp 17   SpO2 97%   Physical Exam Vitals and nursing note reviewed.  Constitutional:      General: He is awake. He is not in acute distress.    Appearance: Normal appearance. He is not ill-appearing, toxic-appearing or diaphoretic.  HENT:     Head: Normocephalic and atraumatic.  Eyes:     General: No scleral icterus.    Extraocular Movements: Extraocular movements intact.  Pulmonary:     Effort: Pulmonary effort is normal. No respiratory distress.  Musculoskeletal:        General: Normal range of motion.     Right lower leg: No edema.     Left lower leg: No edema.     Comments: Pain with palpation of left hip area, pain also worse with movement especially external rotation of the hip, negative logroll test, patient ambulatory with cane  Skin:    General: Skin is warm.     Capillary Refill: Capillary refill takes less than 2 seconds.  Neurological:     General: No focal deficit present.     Mental Status: He is alert and oriented to person, place, and time.  Psychiatric:        Mood and Affect: Mood normal.        Behavior: Behavior normal. Behavior is cooperative.     (all labs ordered are listed, but only abnormal results are displayed) Labs Reviewed - No data to display  EKG: None  Radiology: DG Hip Unilat  With Pelvis 2-3 Views Right Result Date: 11/17/2023 EXAM: 2 VIEW(S) XRAY OF THE RIGHT HIP 11/17/2023 06:50:00 PM COMPARISON: 06/18/2023 CLINICAL HISTORY: Pain. Reason for exam: pain; Per triage notes: Pt came in d/t Rt hip pain that restarted this morning, states the weather was bad \\T \ he feels like it is out. Ambulating with cane upon arrival. FINDINGS: BONES AND JOINTS: Severe degenerative changes of the left  hip with sclerosis and subchondral cyst formation involving the acetabulum and left femoral head, without subchondral collapse. Mild degenerative changes of the right hip. SOFT TISSUES: The soft tissues are unremarkable. IMPRESSION: 1. Mild degenerative changes of the right hip. 2. Severe degenerative changes of the left hip. Electronically signed by: Pinkie Pebbles MD 11/17/2023 07:15 PM EDT RP Workstation: [id number]     Procedures   Medications Ordered in the ED  oxyCODONE  (Oxy IR/ROXICODONE ) immediate release tablet 5 mg (5 mg Oral Given 11/17/23 1953)  Medical Decision Making Amount and/or Complexity of Data Reviewed Radiology: ordered.  Risk Prescription drug management.   Patient presents to the ED for concern of left hip pain, this involves an extensive number of treatment options, and is a complaint that carries with it a high risk of complications and morbidity.  The differential diagnosis includes fracture, injury, septic arthritis, osteoarthritis, gout, pseudogout, inflammatory arthritis, avascular necrosis, etc.   Co morbidities that complicate the patient evaluation  NSTEMI, coronary artery disease, hypertension, hyperlipidemia, carotid artery occlusion, CVA, polysubstance abuse   Additional history obtained:  Reviewed including multiple visits for patient has been seen for left hip pain, given oxycodone  as well as naproxen , can also see preadmit for upcoming knee replacement   Imaging Studies ordered:  I ordered imaging studies including left I independently visualized and interpreted imaging which showed left and right hip osteoarthritis, left hip osteoarthritis appears to be more severe by my interpretation of the x-ray I agree with the radiologist interpretation   Medicines ordered and prescription drug management:  I ordered medication including oxycodone  for left hip pain Reevaluation of the patient after these  medicines showed that the patient improved I have reviewed the patients home medicines and have made adjustments as needed   Test Considered:  None   Critical Interventions:  None   Problem List / ED Course:  63 year old male, chief complaint of left hip pain, known osteoarthritis, I can see where patient has been repeatedly to the emergency department and received oxycodone  as well as naproxen , patient states that these medications do help On further questioning patient is on Brilinta  and is compliant, I am concerned with long-term use of anti-inflammatory medication as well as blood thinning medication, I feel like best course of management is for patient to follow-up with primary care provider as well as orthopedics for further management of this chronic pain Patient symptomatically treated with oxycodone  here in the emergency department with improvement and discharged with instructions to follow-up with orthopedics and primary care regarding long-term anti-inflammatory therapy. Patient understanding of this Return precautions given Patient discharged Suspect acute flare of osteoarthritis related to left hip, low clinical suspicion for septic arthritis or other life-threatening process as patient is clinically well-appearing with stable vital signs, range of motion of hip is intact however painful especially with external rotation, patient ambulatory with cane, on x-ray left hip osteoarthritis appears to be more severe than right   Reevaluation:  After the interventions noted above, I reevaluated the patient and found that they have :improved   Social Determinants of Health:  None   Dispostion:  After consideration of the diagnostic results and the patients response to treatment, I feel that the patent would benefit from discharge and outpatient therapy as described, follow-up with either orthopedics or primary care provider regarding anti-inflammatory medication for chronic  osteoarthritis and chronic hip pain management.       Final diagnoses:  Chronic left hip pain    ED Discharge Orders     None          Janetta Terrall FALCON, PA-C 11/17/23 2030    Pamella Ozell LABOR, DO 11/18/23 1050

## 2023-11-17 NOTE — ED Notes (Signed)
 X-ray at bedside.

## 2023-11-23 ENCOUNTER — Encounter (HOSPITAL_COMMUNITY): Payer: Self-pay | Admitting: *Deleted

## 2023-11-23 ENCOUNTER — Encounter (HOSPITAL_COMMUNITY): Payer: Self-pay

## 2023-11-23 ENCOUNTER — Emergency Department (HOSPITAL_COMMUNITY)
Admission: EM | Admit: 2023-11-23 | Discharge: 2023-11-23 | Disposition: A | Discharge status: Home / Self Care | Attending: Emergency Medicine | Admitting: Emergency Medicine

## 2023-11-23 ENCOUNTER — Other Ambulatory Visit: Payer: Self-pay

## 2023-11-23 ENCOUNTER — Emergency Department (HOSPITAL_COMMUNITY)
Admission: EM | Admit: 2023-11-23 | Discharge: 2023-11-23 | Disposition: A | Discharge status: Home / Self Care | Source: Home / Self Care | Attending: Emergency Medicine | Admitting: Emergency Medicine

## 2023-11-23 DIAGNOSIS — Z79899 Other long term (current) drug therapy: Secondary | ICD-10-CM | POA: Diagnosis not present

## 2023-11-23 DIAGNOSIS — Z7982 Long term (current) use of aspirin: Secondary | ICD-10-CM | POA: Insufficient documentation

## 2023-11-23 DIAGNOSIS — I2510 Atherosclerotic heart disease of native coronary artery without angina pectoris: Secondary | ICD-10-CM | POA: Diagnosis not present

## 2023-11-23 DIAGNOSIS — M25562 Pain in left knee: Secondary | ICD-10-CM | POA: Insufficient documentation

## 2023-11-23 DIAGNOSIS — G8929 Other chronic pain: Secondary | ICD-10-CM | POA: Insufficient documentation

## 2023-11-23 DIAGNOSIS — M25552 Pain in left hip: Secondary | ICD-10-CM | POA: Insufficient documentation

## 2023-11-23 DIAGNOSIS — I10 Essential (primary) hypertension: Secondary | ICD-10-CM | POA: Insufficient documentation

## 2023-11-23 DIAGNOSIS — Z8673 Personal history of transient ischemic attack (TIA), and cerebral infarction without residual deficits: Secondary | ICD-10-CM | POA: Insufficient documentation

## 2023-11-23 DIAGNOSIS — M25561 Pain in right knee: Secondary | ICD-10-CM | POA: Insufficient documentation

## 2023-11-23 MED ORDER — METHYLPREDNISOLONE SODIUM SUCC 125 MG IJ SOLR
125.0000 mg | Freq: Once | INTRAMUSCULAR | Status: AC
  Administered 2023-11-23: 125 mg via INTRAMUSCULAR
  Filled 2023-11-23: qty 2

## 2023-11-23 MED ORDER — KETOROLAC TROMETHAMINE 15 MG/ML IJ SOLN
15.0000 mg | Freq: Once | INTRAMUSCULAR | Status: AC
  Administered 2023-11-23: 15 mg via INTRAMUSCULAR
  Filled 2023-11-23: qty 1

## 2023-11-23 MED ORDER — LIDOCAINE 5 % EX PTCH: 1.0000 | MEDICATED_PATCH | CUTANEOUS | 0 refills | Status: DC

## 2023-11-23 MED ORDER — HYDROCODONE-ACETAMINOPHEN 5-325 MG PO TABS
1.0000 | ORAL_TABLET | Freq: Once | ORAL | Status: AC
  Administered 2023-11-23: 1 via ORAL
  Filled 2023-11-23: qty 1

## 2023-11-23 NOTE — ED Notes (Signed)
 Provider said pt is good for the lobby

## 2023-11-23 NOTE — ED Triage Notes (Addendum)
 Pt is coming in by medic and is homeless for continued right hip and knee pain that he was seen for in the 21st of September. He was able to ambulate to the wheelchair without assistance using his cane. Pt has no other complaints at this time. Pt had also called back to medic after the initial call asking them to come faster cause it was raining   Medic vitals   132/80 60hr 100%ra 18rr

## 2023-11-23 NOTE — ED Provider Notes (Signed)
 [place] EMERGENCY DEPARTMENT AT [facility] Provider Note   CSN: [id number] Arrival date & time: 11/23/23  9870     Patient presents with: Knee Pain   Kevin Barton is a 63 y.o. male.    Knee Pain    Patient has a history of chronic knee pain, chronic back pain, hypertension, coronary artery disease, stroke, hyperlipidemia.  Patient is currently not domiciled.  He has been seen multiple times for issues with chronic pain in his knee and back.  This is the patient's 22nd visit in the last 6 months.  Patient is scheduled for orthopedic hip replacement.  He is also supposed to be scheduled for knee replacement.  Patient states he has intermittent episodes of having increasing pain in his knee and hip.  Patient states he comes to the ED and the given pain medications but he does not have a prescription that he takes at home.  Patient denies any fevers chills.  No recent falls.  Prior to Admission medications   Medication Sig Start Date End Date Taking? Authorizing Provider  amLODipine  (NORVASC ) 5 MG tablet Take 1 tablet (5 mg total) by mouth daily. 11/14/21   Wonda Sharper, MD  amoxicillin -clavulanate (AUGMENTIN ) 875-125 MG tablet Take 1 tablet by mouth every 12 (twelve) hours. 07/28/23   Logan Ubaldo NOVAK, PA-C  aspirin  EC 81 MG tablet Take 1 tablet (81 mg total) by mouth 2 (two) times daily. To be taken after surgery to prevent blood clots if not on any other blood thinners Patient not taking: Reported on 03/18/2023 12/19/22 12/19/23  Jule Ronal CROME, PA-C  atorvastatin  (LIPITOR ) 80 MG tablet Take 1 tablet (80 mg total) by mouth daily at 6 PM. 11/14/21   Wonda Sharper, MD  BAYER LOW DOSE 81 MG tablet Take 81 mg by mouth daily. Swallow whole.    [provider]  BRILINTA  90 MG TABS tablet Take 90 mg by mouth in the morning.    [provider]  cyclobenzaprine  (FLEXERIL ) 10 MG tablet Take 1 tablet (10 mg total) by mouth 2 (two) times daily as needed for muscle  spasms. 06/06/23   Ruthell Lonni FALCON, PA-C  docusate sodium  (COLACE) 100 MG capsule Take 1 capsule (100 mg total) by mouth daily as needed. Patient not taking: Reported on 03/18/2023 12/19/22 12/19/23  Jule Ronal CROME, PA-C  furosemide  (LASIX ) 20 MG tablet Take 1 tablet (20 mg total) by mouth daily. 08/22/23   Horton, Charmaine FALCON, MD  lidocaine  (LIDODERM ) 5 % Place 1 patch onto the skin daily. Remove & Discard patch within 12 hours or as directed by MD 11/23/23  Yes Randol Simmonds, MD  methocarbamol  (ROBAXIN -750) 750 MG tablet Take 1 tablet (750 mg total) by mouth 2 (two) times daily as needed for muscle spasms. Patient not taking: Reported on 03/18/2023 12/19/22   Jule Ronal CROME, PA-C  naproxen  (NAPROSYN ) 500 MG tablet Take 1 tablet (500 mg total) by mouth 2 (two) times daily. 11/03/23   Randol Simmonds, MD  ondansetron  (ZOFRAN ) 4 MG tablet Take 1 tablet (4 mg total) by mouth every 8 (eight) hours as needed for nausea or vomiting. Patient not taking: Reported on 03/18/2023 12/19/22   Jule Ronal CROME, PA-C  oxyCODONE -acetaminophen  (PERCOCET) 5-325 MG tablet Take 1-2 tablets by mouth every 8 (eight) hours as needed. 06/08/23   Vicky Charleston, PA-C  Vitamin D, Ergocalciferol, (DRISDOL) 1.25 MG (50000 UNIT) CAPS capsule Take 50,000 Units by mouth once a week. 10/01/22   [provider]  Allergies: Patient has no known allergies.    Review of Systems  Updated Vital Signs BP 135/80   Pulse 68   Temp 97.8 F (36.6 C)   Resp 18   SpO2 100%   Physical Exam Vitals and nursing note reviewed.  Constitutional:      General: He is not in acute distress.    Appearance: He is well-developed.  HENT:     Head: Normocephalic and atraumatic.     Right Ear: External ear normal.     Left Ear: External ear normal.  Eyes:     General: No scleral icterus.       Right eye: No discharge.        Left eye: No discharge.     Conjunctiva/sclera: Conjunctivae normal.  Neck:     Trachea: No tracheal  deviation.  Cardiovascular:     Rate and Rhythm: Normal rate.  Pulmonary:     Effort: Pulmonary effort is normal. No respiratory distress.     Breath sounds: No stridor.  Abdominal:     General: There is no distension.  Musculoskeletal:        General: Tenderness present. No swelling or deformity.     Cervical back: Neck supple.  Skin:    General: Skin is warm and dry.     Findings: No rash.  Neurological:     Mental Status: He is alert. Mental status is at baseline.     Cranial Nerves: No dysarthria or facial asymmetry.     Motor: No seizure activity.     (all labs ordered are listed, but only abnormal results are displayed) Labs Reviewed - No data to display  EKG: None  Radiology: No results found.   Procedures   Medications Ordered in the ED  HYDROcodone -acetaminophen  (NORCO/VICODIN) 5-325 MG per tablet 1 tablet (has no administration in time range)                                    Medical Decision Making Risk Prescription drug management.   Patient with recurrent right hip and knee pain.  Patient does not have any signs of infection.  No acute trauma.  Explained to patient that the ER is not the appropriate place to give him long-term opiate prescription for his pain.  Will give him a dose of pain meds here.  Recommend outpatient follow-up with orthopedics     Final diagnoses:  Chronic knee pain, unspecified laterality    ED Discharge Orders          Ordered    lidocaine  (LIDODERM ) 5 %  Every 24 hours        11/23/23 1017               Randol Simmonds, MD 11/23/23 1017

## 2023-11-23 NOTE — Discharge Instructions (Addendum)
 Follow-up with the orthopedic surgeon.  Return for any emergent symptoms.  Tylenol  and ibuprofen  at home for pain control.

## 2023-11-23 NOTE — ED Triage Notes (Signed)
 Pt states he is sick and tired of coming here over and over again and only getting one little pill that doesn't work.  He is due to have surgery on his L him on 10/13 and he would like a shot to hold him over.

## 2023-11-23 NOTE — ED Notes (Signed)
 Chief Complaint  Patient presents with   Knee Pain   Past Medical History:  Diagnosis Date   Arthritis    CAD (coronary artery disease)    cath 06/2016 occlusion of RCA with collateralization from the left coronary artery. Mild nonobstructive plaque in the left main, LAD, and LCx. LVEF 50%. TX Rx.    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

## 2023-11-23 NOTE — ED Provider Notes (Signed)
 [place] EMERGENCY DEPARTMENT AT [facility] Provider Note   CSN: [id number] Arrival date & time: 11/23/23  2018     Patient presents with: Knee Pain   Kevin Barton is a 63 y.o. male.   Patient has a history of chronic knee pain, chronic back pain, hypertension, coronary artery disease, stroke, hyperlipidemia.  He is been having pain for quite some time and has had multiple visits to the emergency department.  He is due to undergo surgery with the orthopedic surgeon.  He states last month he received an injection that was most helpful.  Since then he has been receiving a dose of narcotic pill which he states does not help him for any significant amount of time.  He is unsure of exactly what this injection was whether it was Toradol  or steroid shot.  He does not have any kidney disease.  The history is provided by the patient. No language interpreter was used.       Prior to Admission medications   Medication Sig Start Date End Date Taking? Authorizing Provider  amLODipine  (NORVASC ) 5 MG tablet Take 1 tablet (5 mg total) by mouth daily. 11/14/21   Wonda Sharper, MD  amoxicillin -clavulanate (AUGMENTIN ) 875-125 MG tablet Take 1 tablet by mouth every 12 (twelve) hours. 07/28/23   Logan Ubaldo NOVAK, PA-C  aspirin  EC 81 MG tablet Take 1 tablet (81 mg total) by mouth 2 (two) times daily. To be taken after surgery to prevent blood clots if not on any other blood thinners Patient not taking: Reported on 03/18/2023 12/19/22 12/19/23  Jule Ronal CROME, PA-C  atorvastatin  (LIPITOR ) 80 MG tablet Take 1 tablet (80 mg total) by mouth daily at 6 PM. 11/14/21   Wonda Sharper, MD  BAYER LOW DOSE 81 MG tablet Take 81 mg by mouth daily. Swallow whole.    [provider]  BRILINTA  90 MG TABS tablet Take 90 mg by mouth in the morning.    [provider]  cyclobenzaprine  (FLEXERIL ) 10 MG tablet Take 1 tablet (10 mg total) by mouth 2 (two) times daily as needed for muscle spasms.  06/06/23   Ruthell Lonni FALCON, PA-C  docusate sodium  (COLACE) 100 MG capsule Take 1 capsule (100 mg total) by mouth daily as needed. Patient not taking: Reported on 03/18/2023 12/19/22 12/19/23  Jule Ronal CROME, PA-C  furosemide  (LASIX ) 20 MG tablet Take 1 tablet (20 mg total) by mouth daily. 08/22/23   Horton, Charmaine FALCON, MD  lidocaine  (LIDODERM ) 5 % Place 1 patch onto the skin daily. Remove & Discard patch within 12 hours or as directed by MD 11/23/23   Randol Simmonds, MD  methocarbamol  (ROBAXIN -750) 750 MG tablet Take 1 tablet (750 mg total) by mouth 2 (two) times daily as needed for muscle spasms. Patient not taking: Reported on 03/18/2023 12/19/22   Jule Ronal CROME, PA-C  naproxen  (NAPROSYN ) 500 MG tablet Take 1 tablet (500 mg total) by mouth 2 (two) times daily. 11/03/23   Randol Simmonds, MD  ondansetron  (ZOFRAN ) 4 MG tablet Take 1 tablet (4 mg total) by mouth every 8 (eight) hours as needed for nausea or vomiting. Patient not taking: Reported on 03/18/2023 12/19/22   Jule Ronal CROME, PA-C  oxyCODONE -acetaminophen  (PERCOCET) 5-325 MG tablet Take 1-2 tablets by mouth every 8 (eight) hours as needed. 06/08/23   Vicky Charleston, PA-C  Vitamin D, Ergocalciferol, (DRISDOL) 1.25 MG (50000 UNIT) CAPS capsule Take 50,000 Units by mouth once a week. 10/01/22   [provider]  Allergies: Patient has no known allergies.    Review of Systems  Constitutional:  Negative for chills and fever.  Musculoskeletal:  Positive for arthralgias.  All other systems reviewed and are negative.   Updated Vital Signs BP (!) 145/69   Pulse 69   Temp 98.4 F (36.9 C)   Resp 16   Ht 5' 4 (1.626 m)   Wt 77.6 kg   SpO2 99%   BMI 29.37 kg/m   Physical Exam Vitals and nursing note reviewed.  Constitutional:      General: He is not in acute distress.    Appearance: Normal appearance. He is not ill-appearing.  HENT:     Head: Normocephalic and atraumatic.     Nose: Nose normal.  Eyes:      Conjunctiva/sclera: Conjunctivae normal.  Pulmonary:     Effort: Pulmonary effort is normal. No respiratory distress.  Musculoskeletal:        General: No deformity.     Comments: Limited range of motion secondary to pain.  No overlying warmth or erythema.  Neurovascularly intact in bilateral lower extremities.  Skin:    Findings: No rash.  Neurological:     Mental Status: He is alert.     (all labs ordered are listed, but only abnormal results are displayed) Labs Reviewed - No data to display  EKG: None  Radiology: No results found.   Procedures   Medications Ordered in the ED  ketorolac  (TORADOL ) 15 MG/ML injection 15 mg (has no administration in time range)  methylPREDNISolone  sodium succinate (SOLU-MEDROL ) 125 mg/2 mL injection 125 mg (has no administration in time range)                                    Medical Decision Making Risk Prescription drug management.   63 year old male presents today for concern of left knee and hip pain.  This appears to be chronic issues and he has undergoing surgery on 10/13.  No other complaints.  Would like a shot to hold him over until surgery. Will give him Toradol  shot as well as Solu-Medrol . Return precautions discussed. Discharged in stable condition. He will follow-up with his orthopedic surgeon.   Final diagnoses:  Left hip pain  Chronic pain of left knee    ED Discharge Orders     None          Hildegard Loge, PA-C 11/23/23 2125    Cottie Donnice PARAS, MD 11/23/23 2256

## 2023-11-23 NOTE — ED Triage Notes (Signed)
 Pt raging about everything in this hospital although he comes to the ed almost every day or night c/o leg pain

## 2023-11-24 ENCOUNTER — Encounter (HOSPITAL_COMMUNITY): Payer: Self-pay

## 2023-11-24 ENCOUNTER — Emergency Department (HOSPITAL_COMMUNITY)
Admission: EM | Admit: 2023-11-24 | Discharge: 2023-11-25 | Disposition: A | Attending: Emergency Medicine | Admitting: Emergency Medicine

## 2023-11-24 DIAGNOSIS — M25552 Pain in left hip: Secondary | ICD-10-CM | POA: Diagnosis not present

## 2023-11-24 DIAGNOSIS — I10 Essential (primary) hypertension: Secondary | ICD-10-CM | POA: Diagnosis not present

## 2023-11-24 DIAGNOSIS — G8929 Other chronic pain: Secondary | ICD-10-CM | POA: Diagnosis not present

## 2023-11-24 DIAGNOSIS — Z79899 Other long term (current) drug therapy: Secondary | ICD-10-CM | POA: Diagnosis not present

## 2023-11-24 DIAGNOSIS — I2510 Atherosclerotic heart disease of native coronary artery without angina pectoris: Secondary | ICD-10-CM | POA: Insufficient documentation

## 2023-11-24 DIAGNOSIS — Z7982 Long term (current) use of aspirin: Secondary | ICD-10-CM | POA: Insufficient documentation

## 2023-11-24 NOTE — ED Triage Notes (Signed)
 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

## 2023-11-25 ENCOUNTER — Encounter (HOSPITAL_COMMUNITY): Payer: Self-pay

## 2023-11-25 ENCOUNTER — Emergency Department (HOSPITAL_COMMUNITY)
Admission: EM | Admit: 2023-11-25 | Discharge: 2023-11-25 | Disposition: A | Discharge status: Home / Self Care | Source: Home / Self Care

## 2023-11-25 ENCOUNTER — Other Ambulatory Visit: Payer: Self-pay

## 2023-11-25 DIAGNOSIS — E559 Vitamin D deficiency, unspecified: Secondary | ICD-10-CM | POA: Diagnosis not present

## 2023-11-25 DIAGNOSIS — M25552 Pain in left hip: Secondary | ICD-10-CM | POA: Insufficient documentation

## 2023-11-25 DIAGNOSIS — Z7982 Long term (current) use of aspirin: Secondary | ICD-10-CM | POA: Insufficient documentation

## 2023-11-25 DIAGNOSIS — Z79899 Other long term (current) drug therapy: Secondary | ICD-10-CM | POA: Diagnosis not present

## 2023-11-25 DIAGNOSIS — G8929 Other chronic pain: Secondary | ICD-10-CM | POA: Insufficient documentation

## 2023-11-25 DIAGNOSIS — R5383 Other fatigue: Secondary | ICD-10-CM | POA: Diagnosis not present

## 2023-11-25 DIAGNOSIS — Z1159 Encounter for screening for other viral diseases: Secondary | ICD-10-CM | POA: Diagnosis not present

## 2023-11-25 DIAGNOSIS — Z0000 Encounter for general adult medical examination without abnormal findings: Secondary | ICD-10-CM | POA: Diagnosis not present

## 2023-11-25 DIAGNOSIS — Z125 Encounter for screening for malignant neoplasm of prostate: Secondary | ICD-10-CM | POA: Diagnosis not present

## 2023-11-25 MED ORDER — KETOROLAC TROMETHAMINE 15 MG/ML IJ SOLN
15.0000 mg | Freq: Once | INTRAMUSCULAR | Status: AC
Start: 2023-11-25 — End: 2023-11-25
  Administered 2023-11-25: 15 mg via INTRAMUSCULAR
  Filled 2023-11-25: qty 1

## 2023-11-25 MED ORDER — DEXAMETHASONE SODIUM PHOSPHATE 10 MG/ML IJ SOLN
10.0000 mg | Freq: Once | INTRAMUSCULAR | Status: AC
  Administered 2023-11-25: 10 mg via INTRAMUSCULAR
  Filled 2023-11-25: qty 1

## 2023-11-25 MED ORDER — KETOROLAC TROMETHAMINE 15 MG/ML IJ SOLN
15.0000 mg | Freq: Once | INTRAMUSCULAR | Status: AC
  Administered 2023-11-25: 15 mg via INTRAMUSCULAR
  Filled 2023-11-25: qty 1

## 2023-11-25 NOTE — ED Provider Notes (Signed)
 [place] EMERGENCY DEPARTMENT AT [facility] Provider Note   CSN: [id number] Arrival date & time: 11/24/23  1821     Patient presents with: Hip Pain   Kevin Barton is a 63 y.o. male.   The history is provided by the patient and medical records.  Hip Pain   63 year old male with history of anemia, CAD, cocaine abuse, hypertension, hyperlipidemia, history of osteoarthritis, presenting to the ED with left hip pain.  States he cooks Friday to Sunday as standing up on the hard floor causes him a lot of pain.  He denies any falls or trauma.  States he feels like his hip is aggravated he reports a lot of relief with Toradol  and steroids in the past and is requesting repeat injections today.  He denies any numbness or weakness of the leg.  He remains ambulatory  Prior to Admission medications   Medication Sig Start Date End Date Taking? Authorizing Provider  amLODipine  (NORVASC ) 5 MG tablet Take 1 tablet (5 mg total) by mouth daily. 11/14/21   Wonda Sharper, MD  amoxicillin -clavulanate (AUGMENTIN ) 875-125 MG tablet Take 1 tablet by mouth every 12 (twelve) hours. 07/28/23   Logan Ubaldo NOVAK, PA-C  aspirin  EC 81 MG tablet Take 1 tablet (81 mg total) by mouth 2 (two) times daily. To be taken after surgery to prevent blood clots if not on any other blood thinners Patient not taking: Reported on 03/18/2023 12/19/22 12/19/23  Jule Ronal CROME, PA-C  atorvastatin  (LIPITOR ) 80 MG tablet Take 1 tablet (80 mg total) by mouth daily at 6 PM. 11/14/21   Wonda Sharper, MD  BAYER LOW DOSE 81 MG tablet Take 81 mg by mouth daily. Swallow whole.    [provider]  BRILINTA  90 MG TABS tablet Take 90 mg by mouth in the morning.    [provider]  cyclobenzaprine  (FLEXERIL ) 10 MG tablet Take 1 tablet (10 mg total) by mouth 2 (two) times daily as needed for muscle spasms. 06/06/23   Ruthell Lonni FALCON, PA-C  docusate sodium  (COLACE) 100 MG capsule Take 1 capsule (100 mg total) by  mouth daily as needed. Patient not taking: Reported on 03/18/2023 12/19/22 12/19/23  Jule Ronal CROME, PA-C  furosemide  (LASIX ) 20 MG tablet Take 1 tablet (20 mg total) by mouth daily. 08/22/23   Horton, Charmaine FALCON, MD  lidocaine  (LIDODERM ) 5 % Place 1 patch onto the skin daily. Remove & Discard patch within 12 hours or as directed by MD 11/23/23   Randol Simmonds, MD  methocarbamol  (ROBAXIN -750) 750 MG tablet Take 1 tablet (750 mg total) by mouth 2 (two) times daily as needed for muscle spasms. Patient not taking: Reported on 03/18/2023 12/19/22   Jule Ronal CROME, PA-C  naproxen  (NAPROSYN ) 500 MG tablet Take 1 tablet (500 mg total) by mouth 2 (two) times daily. 11/03/23   Randol Simmonds, MD  ondansetron  (ZOFRAN ) 4 MG tablet Take 1 tablet (4 mg total) by mouth every 8 (eight) hours as needed for nausea or vomiting. Patient not taking: Reported on 03/18/2023 12/19/22   Jule Ronal CROME, PA-C  oxyCODONE -acetaminophen  (PERCOCET) 5-325 MG tablet Take 1-2 tablets by mouth every 8 (eight) hours as needed. 06/08/23   Vicky Charleston, PA-C  Vitamin D, Ergocalciferol, (DRISDOL) 1.25 MG (50000 UNIT) CAPS capsule Take 50,000 Units by mouth once a week. 10/01/22   [provider]    Allergies: Patient has no known allergies.    Review of Systems  Musculoskeletal:  Positive for arthralgias.  All  other systems reviewed and are negative.   Updated Vital Signs BP (!) 154/74   Pulse 80   Temp 97.8 F (36.6 C)   Resp 16   SpO2 100%   Physical Exam Vitals and nursing note reviewed.  Constitutional:      Appearance: He is well-developed.  HENT:     Head: Normocephalic and atraumatic.  Eyes:     Conjunctiva/sclera: Conjunctivae normal.     Pupils: Pupils are equal, round, and reactive to light.  Cardiovascular:     Rate and Rhythm: Normal rate and regular rhythm.     Heart sounds: Normal heart sounds.  Pulmonary:     Effort: Pulmonary effort is normal.     Breath sounds: Normal breath sounds.   Musculoskeletal:        General: Normal range of motion.     Cervical back: Normal range of motion.     Comments: No leg shortening or malrotation, pelvis is stable and nontender  Skin:    General: Skin is warm and dry.  Neurological:     Mental Status: He is alert and oriented to person, place, and time.     (all labs ordered are listed, but only abnormal results are displayed) Labs Reviewed - No data to display  EKG: None  Radiology: No results found.   Procedures   Medications Ordered in the ED  ketorolac  (TORADOL ) 15 MG/ML injection 15 mg (15 mg Intramuscular Given 11/25/23 0049)  dexamethasone  (DECADRON ) injection 10 mg (10 mg Intramuscular Given 11/25/23 0049)                                    Medical Decision Making Risk Prescription drug management.   63 year old male presenting to the ED with acute on chronic left hip pain.  States he has been cooking all weekend and standing on the hard floor has aggravated his left hip.  Denies any falls or trauma.  Does not have any leg shortening or acute deformities on exam.  He remains ambulatory.  Has had prior injections of Toradol  and steroids and requested repeat of same which have been given (recent SrCr WNL).  Feel he is stable for discharge.  Have encouraged him to follow-up closely with PCP.  Can return here for new concerns.  Final diagnoses:  Left hip pain    ED Discharge Orders     None          Jarold Olam HERO, PA-C 11/25/23 0057    Jerral Meth, MD 11/25/23 [phone]

## 2023-11-25 NOTE — ED Provider Notes (Signed)
 [place] EMERGENCY DEPARTMENT AT [facility] Provider Note   CSN: [id number] Arrival date & time: 11/25/23  1756     Patient presents with: No chief complaint on file.   Kevin Barton is a 63 y.o. male.   Patient with frequent ED visits for chronic left hip pain.  Patient was seen in the past 2 days, had IM Toradol .  Nilsa was given a dose of IM Decadron .  Patient presents with ongoing, similar pain.  Denies radiating pain down the leg.  He is ambulatory.  States that the pain is worse when the weather gets bad.  Requesting additional medication.       Prior to Admission medications   Medication Sig Start Date End Date Taking? Authorizing Provider  amLODipine  (NORVASC ) 5 MG tablet Take 1 tablet (5 mg total) by mouth daily. 11/14/21   Wonda Sharper, MD  amoxicillin -clavulanate (AUGMENTIN ) 875-125 MG tablet Take 1 tablet by mouth every 12 (twelve) hours. 07/28/23   Logan Ubaldo NOVAK, PA-C  aspirin  EC 81 MG tablet Take 1 tablet (81 mg total) by mouth 2 (two) times daily. To be taken after surgery to prevent blood clots if not on any other blood thinners Patient not taking: Reported on 03/18/2023 12/19/22 12/19/23  Jule Ronal CROME, PA-C  atorvastatin  (LIPITOR ) 80 MG tablet Take 1 tablet (80 mg total) by mouth daily at 6 PM. 11/14/21   Wonda Sharper, MD  BAYER LOW DOSE 81 MG tablet Take 81 mg by mouth daily. Swallow whole.    [provider]  BRILINTA  90 MG TABS tablet Take 90 mg by mouth in the morning.    [provider]  cyclobenzaprine  (FLEXERIL ) 10 MG tablet Take 1 tablet (10 mg total) by mouth 2 (two) times daily as needed for muscle spasms. 06/06/23   Ruthell Lonni FALCON, PA-C  docusate sodium  (COLACE) 100 MG capsule Take 1 capsule (100 mg total) by mouth daily as needed. Patient not taking: Reported on 03/18/2023 12/19/22 12/19/23  Jule Ronal CROME, PA-C  furosemide  (LASIX ) 20 MG tablet Take 1 tablet (20 mg total) by mouth daily. 08/22/23   Horton,  Charmaine FALCON, MD  lidocaine  (LIDODERM ) 5 % Place 1 patch onto the skin daily. Remove & Discard patch within 12 hours or as directed by MD 11/23/23   Randol Simmonds, MD  methocarbamol  (ROBAXIN -750) 750 MG tablet Take 1 tablet (750 mg total) by mouth 2 (two) times daily as needed for muscle spasms. Patient not taking: Reported on 03/18/2023 12/19/22   Jule Ronal CROME, PA-C  naproxen  (NAPROSYN ) 500 MG tablet Take 1 tablet (500 mg total) by mouth 2 (two) times daily. 11/03/23   Randol Simmonds, MD  ondansetron  (ZOFRAN ) 4 MG tablet Take 1 tablet (4 mg total) by mouth every 8 (eight) hours as needed for nausea or vomiting. Patient not taking: Reported on 03/18/2023 12/19/22   Jule Ronal CROME, PA-C  oxyCODONE -acetaminophen  (PERCOCET) 5-325 MG tablet Take 1-2 tablets by mouth every 8 (eight) hours as needed. 06/08/23   Vicky Charleston, PA-C  Vitamin D, Ergocalciferol, (DRISDOL) 1.25 MG (50000 UNIT) CAPS capsule Take 50,000 Units by mouth once a week. 10/01/22   [provider]    Allergies: Patient has no known allergies.    Review of Systems  Updated Vital Signs BP (!) 146/73 (BP Location: Right Arm)   Pulse 62   Temp 98.1 F (36.7 C)   Resp 16   Ht 5' 4 (1.626 m)   Wt 79.8 kg   SpO2  100%   BMI 30.21 kg/m   Physical Exam Vitals and nursing note reviewed.  Constitutional:      Appearance: He is well-developed.  HENT:     Head: Normocephalic and atraumatic.  Eyes:     Conjunctiva/sclera: Conjunctivae normal.  Pulmonary:     Effort: No respiratory distress.  Musculoskeletal:     Cervical back: Normal range of motion and neck supple.     Left hip: Tenderness present. Normal range of motion.     Left foot: Normal pulse.  Skin:    General: Skin is warm and dry.  Neurological:     Mental Status: He is alert.     (all labs ordered are listed, but only abnormal results are displayed) Labs Reviewed - No data to display  EKG: None  Radiology: No results found.   Procedures    Medications Ordered in the ED  ketorolac  (TORADOL ) 15 MG/ML injection 15 mg (15 mg Intramuscular Given 11/25/23 1830)   ED Course  Patient seen and examined. History obtained directly from patient.  Reviewed recent ED notes.  Labs/EKG: None ordered  Imaging: None ordered  Medications/Fluids: Ordered: IM Toradol   Most recent vital signs reviewed and are as follows: BP (!) 146/73 (BP Location: Right Arm)   Pulse 62   Temp 98.1 F (36.7 C)   Resp 16   Ht 5' 4 (1.626 m)   Wt 79.8 kg   SpO2 100%   BMI 30.21 kg/m   Initial impression: Chronic left hip pain  Home treatment plan: Continued home meds, OTC meds  Return instructions discussed with patient: New or worsening  Follow-up instructions discussed with patient: PCP in 1 week.                                    Medical Decision Making Risk Prescription drug management.   Patient with ongoing left hip pain, here with similar symptoms, no changes from baseline.  Patient given dose of medication for symptom control.  Lower extremity is neurovascularly intact.     Final diagnoses:  Chronic left hip pain    ED Discharge Orders     None          Desiderio Chew, DEVONNA 11/25/23 1850    Ula Prentice SAUNDERS, MD 11/25/23 [phone]

## 2023-11-25 NOTE — Discharge Instructions (Signed)
 Steroid injection should continue to work over the next few days. Please follow-up with your primary care doctor. Return here for new concerns.

## 2023-11-25 NOTE — ED Triage Notes (Signed)
 Pt complains of chronic hip pain that gets worse with bad weather. Seen here yesterday for same.

## 2023-11-25 NOTE — Discharge Instructions (Signed)
 Please follow-up with your doctor, take over-the-counter medications as prescribed.

## 2023-11-27 ENCOUNTER — Other Ambulatory Visit (HOSPITAL_BASED_OUTPATIENT_CLINIC_OR_DEPARTMENT_OTHER): Payer: Self-pay | Admitting: Internal Medicine

## 2023-11-27 DIAGNOSIS — Z87891 Personal history of nicotine dependence: Secondary | ICD-10-CM

## 2023-11-28 ENCOUNTER — Other Ambulatory Visit: Payer: Self-pay

## 2023-11-28 ENCOUNTER — Emergency Department (HOSPITAL_COMMUNITY)
Admission: EM | Admit: 2023-11-28 | Discharge: 2023-11-28 | Disposition: A | Discharge status: Home / Self Care | Attending: Emergency Medicine | Admitting: Emergency Medicine

## 2023-11-28 ENCOUNTER — Encounter (HOSPITAL_COMMUNITY): Payer: Self-pay

## 2023-11-28 DIAGNOSIS — M25562 Pain in left knee: Secondary | ICD-10-CM | POA: Diagnosis not present

## 2023-11-28 DIAGNOSIS — Z8673 Personal history of transient ischemic attack (TIA), and cerebral infarction without residual deficits: Secondary | ICD-10-CM | POA: Insufficient documentation

## 2023-11-28 DIAGNOSIS — I10 Essential (primary) hypertension: Secondary | ICD-10-CM | POA: Insufficient documentation

## 2023-11-28 DIAGNOSIS — M7989 Other specified soft tissue disorders: Secondary | ICD-10-CM | POA: Insufficient documentation

## 2023-11-28 DIAGNOSIS — I2510 Atherosclerotic heart disease of native coronary artery without angina pectoris: Secondary | ICD-10-CM | POA: Diagnosis not present

## 2023-11-28 DIAGNOSIS — F17210 Nicotine dependence, cigarettes, uncomplicated: Secondary | ICD-10-CM | POA: Insufficient documentation

## 2023-11-28 DIAGNOSIS — M25552 Pain in left hip: Secondary | ICD-10-CM | POA: Insufficient documentation

## 2023-11-28 MED ORDER — KETOROLAC TROMETHAMINE 15 MG/ML IJ SOLN
30.0000 mg | Freq: Once | INTRAMUSCULAR | Status: AC
  Administered 2023-11-28: 30 mg via INTRAMUSCULAR
  Filled 2023-11-28: qty 2

## 2023-11-28 NOTE — ED Triage Notes (Signed)
 Patient states that his left hip hurts. Its a chronic issue and it is worse than usual due to the cool weather.

## 2023-11-28 NOTE — ED Triage Notes (Signed)
 Pt c/o L hip pain which is chronic. No new injury per pt. He feels like the pain is related to the weather turning cold

## 2023-11-28 NOTE — ED Provider Notes (Signed)
 [facility]ERGENCY DEPT [facility] Emergency Department Provider Note MRN:  [id number]  Arrival date & time: 11/28/23     Chief Complaint   Hip Pain   History of Present Illness   Kevin Barton is a 63 y.o. year-old male with a history of CAD presenting to the ED with chief complaint of hip pain.  Hip and knee pain on the left side.  Pain starts in the hip and radiates to the knee.  Denies back pain, no numbness or weakness to the arms or legs.  Denies trauma.  Thinks the cold weather is flaring up his pain which has been there for a long time.  Review of Systems  A thorough review of systems was obtained and all systems are negative except as noted in the HPI and PMH.   Patient's Health History    Past Medical History:  Diagnosis Date   Arthritis    CAD (coronary artery disease)    cath 06/2016 occlusion of RCA with collateralization from the left coronary artery. Mild nonobstructive plaque in the left main, LAD, and LCx. LVEF 50%. TX Rx.    Chronic back pain    Chronic knee pain    HTN (hypertension)    Hyperlipidemia    NSTEMI (non-ST elevated myocardial infarction) (HCC)    Stroke (HCC)    Substance abuse (HCC)    Tobacco abuse     Past Surgical History:  Procedure Laterality Date   IR ANGIO INTRA EXTRACRAN SEL COM CAROTID INNOMINATE UNI L MOD SED  10/05/2017   IR ANGIO VERTEBRAL SEL SUBCLAVIAN INNOMINATE UNI R MOD SED  10/05/2017   IR CT HEAD LTD  10/05/2017   IR INTRAVSC STENT CERV CAROTID W/O EMB-PROT MOD SED INC ANGIO  10/05/2017   IR PERCUTANEOUS ART THROMBECTOMY/INFUSION INTRACRANIAL INC DIAG ANGIO  10/05/2017   LEFT HEART CATH AND CORONARY ANGIOGRAPHY N/A 07/25/2016   Procedure: Left Heart Cath and Coronary Angiography;  Surgeon: Wonda Sharper, MD;  Location: [facility] INVASIVE CV LAB;  Service: Cardiovascular;  Laterality: N/A;   RADIOLOGY WITH ANESTHESIA N/A 10/05/2017   Procedure: IR WITH ANESTHESIA CODE STROKE;  Surgeon: Dolphus Carrion, MD;  Location: [facility] OR;   Service: Radiology;  Laterality: N/A;    Family History  Problem Relation Age of Onset   Diabetes Brother     Social History   Socioeconomic History   Marital status: Single    Spouse name: Not on file   Number of children: Not on file   Years of education: Not on file   Highest education level: Not on file  Occupational History   Not on file  Tobacco Use   Smoking status: Every Day    Current packs/day: 0.50    Types: Cigarettes   Smokeless tobacco: Never   Tobacco comments:    smoke 6 per week  Vaping Use   Vaping status: Never Used  Substance and Sexual Activity   Alcohol use: Yes    Comment: a beer a day   Drug use: Yes    Types: Marijuana   Sexual activity: Yes  Other Topics Concern   Not on file  Social History Narrative   Not on file   Social Drivers of Health   Financial Resource Strain: High Risk (09/16/2019)   Overall Financial Resource Strain (CARDIA)    Difficulty of Paying Living Expenses: Hard  Food Insecurity: Food Insecurity Present (09/16/2019)   Hunger Vital Sign    Worried About Running Out of Food in the Last Year:  Sometimes true    Ran Out of Food in the Last Year: Never true  Transportation Needs: No Transportation Needs (09/16/2019)   PRAPARE - Administrator, Civil Service (Medical): No    Lack of Transportation (Non-Medical): No  Physical Activity: Not on file  Stress: Not on file  Social Connections: Not on file  Intimate Partner Violence: Not on file     Physical Exam   Vitals:   11/28/23 2153 11/28/23 2304  BP: (!) 147/75   Pulse: 83   Resp: 16   Temp: 97.9 F (36.6 C)   SpO2: 96% 98%    CONSTITUTIONAL: Well-appearing, NAD NEURO/PSYCH:  Alert and oriented x 3, no focal deficits EYES:  eyes equal and reactive ENT/NECK:  no LAD, no JVD CARDIO: Regular rate, well-perfused, normal S1 and S2 PULM:  CTAB no wheezing or rhonchi GI/GU:  non-distended, non-tender MSK/SPINE:  No gross deformities, no edema SKIN:  no  rash, atraumatic   *Additional and/or pertinent findings included in MDM below  Diagnostic and Interventional Summary    EKG Interpretation Date/Time:    Ventricular Rate:    PR Interval:    QRS Duration:    QT Interval:    QTC Calculation:   R Axis:      Text Interpretation:         Labs Reviewed - No data to display  No orders to display    Medications  ketorolac  (TORADOL ) 15 MG/ML injection 30 mg (has no administration in time range)     Procedures  /  Critical Care Procedures  ED Course and Medical Decision Making  Initial Impression and Ddx Patient resting comfortably on my evaluation, wakes easily.  Extremity is neurovascularly intact, slightly increased swelling on the left leg compared to the right but patient explains that this has been the case for quite a while.  This is confirmed on chart review where some increased swelling on the left side was noted back in June and an ultrasound was performed excluding DVT.  Denies any worsening of the swelling.  Just increased pain today.  Patient has visited the emergency department 6 times in August and 6 times in September.  I brought this up and he has no other complaints, just wants to be put at ease with regard to this pain.  Past medical/surgical history that increases complexity of ED encounter: None  Interpretation of Diagnostics Laboratory and/or imaging options to aid in the diagnosis/care of the patient were considered.  After careful history and physical examination, it was determined that there was no indication for diagnostics at this time.  Patient Reassessment and Ultimate Disposition/Management     Provided with IM Toradol , appropriate for discharge.  Patient management required discussion with the following services or consulting groups:  None  Complexity of Problems Addressed Acute complicated illness or Injury  Additional Data Reviewed and Analyzed Further history obtained from: Prior  labs/imaging results  Additional Factors Impacting ED Encounter Risk None  Ozell HERO. Theadore, MD [facility] Emergency Medicine [facility] [email or web address]  Final Clinical Impressions(s) / ED Diagnoses     ICD-10-CM   1. Acute pain of left knee  M25.562       ED Discharge Orders     None        Discharge Instructions Discussed with and Provided to Patient:    Discharge Instructions      You were evaluated in the Emergency Department and after careful evaluation, we did not  find any emergent condition requiring admission or further testing in the hospital.  Your exam/testing today was overall reassuring. Symptoms likely due to arthritis.  Continued tylenol  or motrin  for pain and follow up with the bone experts.  Please return to the Emergency Department if you experience any worsening of your condition.  Thank you for allowing us  to be a part of your care.       Theadore Ozell HERO, MD 11/28/23 [phone]

## 2023-11-28 NOTE — Discharge Instructions (Signed)
 You were evaluated in the Emergency Department and after careful evaluation, we did not find any emergent condition requiring admission or further testing in the hospital.  Your exam/testing today was overall reassuring. Symptoms likely due to arthritis.  Continued tylenol  or motrin  for pain and follow up with the bone experts.  Please return to the Emergency Department if you experience any worsening of your condition.  Thank you for allowing us  to be a part of your care.

## 2023-12-01 NOTE — Assessment & Plan Note (Deleted)
 S/p R MCA IR intervention and ICA stenting. Followed by neuroradiology. He has been managed with Brilinta  by Neurology.***

## 2023-12-01 NOTE — Progress Notes (Deleted)
    OFFICE NOTE:    Date:  12/01/2023  ID:  Kevin Barton, DOB 1960/03/14, MRN [id number] PCP: Austin Mutton, MD  [facility] Providers Cardiologist:  Ozell Fell, MD Cardiology APP:  Lelon Glendia DASEN, PA-C { Click to update primary MD,subspecialty MD or APP then REFRESH:1}       Coronary artery disease  Known CTO of the RCA w/ L-R collats >> med Rx Cath 07/25/16: oLCx 30, mRCA 100 w L-R collat, EF 45-50  TTE 10/06/17: Moderate LVH, EF 65-70, normal wall motion, grade 2 diastolic dysfunction, mild MAC, trivial MR, normal RVSF  MPI 06/13/21: EF 57, no ischemia, low risk  Hx of CVA (R MCA) in 8/19 >> tx w/ tPA, endovascular intervention/ICA stent Carotid US  03/23/21: RICA stent ok, LICA 40-59 US  02/12/23: R 1-39, L 40-59 Polysubstance abuse Cocaine, THC Hypertension  Hyperlipidemia  Tobacco abuse    Non-adherence to medical Rx  Un-housed       Discussed the use of AI scribe software for clinical note transcription with the patient, who gave verbal consent to proceed. History of Present Illness Clemon Barton is a 63 y.o. male who returns for follow up of CAD. He was last seen in 05/2022. He has been to the ED 15+ times since May for knee and hip pain. He is scheduled for R TKR with Dr. Jerri 12/09/23.     ROS-See HPI***    Studies Reviewed:       06/05/22: K 4.7, SCr 0.85, ALT 11, TC 212, Trig 97, HDL 68, LLD 127 08/20/23: K 4, SCr 1.02, Hgb 13.3,  Results  Risk Assessment/Calculations: {Does this patient have ATRIAL FIBRILLATION?:[id number]} No BP recorded.  {Refresh Note OR Click here to enter BP  :1}***      Physical Exam:  VS:  There were no vitals taken for this visit.       Wt Readings from Last 3 Encounters:  11/25/23 176 lb (79.8 kg)  11/23/23 171 lb 1.2 oz (77.6 kg)  10/23/23 171 lb (77.6 kg)    Physical Exam***     Assessment and Plan:    Assessment & Plan Coronary artery disease involving native coronary artery of native heart without angina  pectoris He has a known occlusion of the RCA with no sig CAD elsewhere by cath in 2018. Myoview  in 05/2021 was low risk and neg for ischemia. *** History of CVA (cerebrovascular accident) without residual deficits S/p R MCA IR intervention and ICA stenting. Followed by neuroradiology. He has been managed with Brilinta  by Neurology.*** Essential (primary) hypertension  Hyperlipidemia LDL goal <70  Assessment and Plan Assessment & Plan    {      :1}    {Are you ordering a CV Procedure (e.g. stress test, cath, DCCV, TEE, etc)?   Press F2        :[id number]}  Dispo:  No follow-ups on file.  Signed, Glendia Lelon, PA-C

## 2023-12-01 NOTE — Assessment & Plan Note (Deleted)
 He has a known occlusion of the RCA with no sig CAD elsewhere by cath in 2018. Myoview  in 05/2021 was low risk and neg for ischemia. ***

## 2023-12-02 ENCOUNTER — Ambulatory Visit: Admitting: Physician Assistant

## 2023-12-02 ENCOUNTER — Encounter (HOSPITAL_COMMUNITY): Payer: Self-pay

## 2023-12-02 ENCOUNTER — Emergency Department (HOSPITAL_COMMUNITY)
Admission: EM | Admit: 2023-12-02 | Discharge: 2023-12-03 | Disposition: A | Attending: Emergency Medicine | Admitting: Emergency Medicine

## 2023-12-02 ENCOUNTER — Emergency Department (HOSPITAL_COMMUNITY)

## 2023-12-02 ENCOUNTER — Other Ambulatory Visit: Payer: Self-pay

## 2023-12-02 DIAGNOSIS — I2510 Atherosclerotic heart disease of native coronary artery without angina pectoris: Secondary | ICD-10-CM

## 2023-12-02 DIAGNOSIS — Z8673 Personal history of transient ischemic attack (TIA), and cerebral infarction without residual deficits: Secondary | ICD-10-CM

## 2023-12-02 DIAGNOSIS — M25552 Pain in left hip: Secondary | ICD-10-CM | POA: Diagnosis not present

## 2023-12-02 DIAGNOSIS — M1612 Unilateral primary osteoarthritis, left hip: Secondary | ICD-10-CM | POA: Diagnosis not present

## 2023-12-02 DIAGNOSIS — E785 Hyperlipidemia, unspecified: Secondary | ICD-10-CM

## 2023-12-02 DIAGNOSIS — G8929 Other chronic pain: Secondary | ICD-10-CM | POA: Diagnosis not present

## 2023-12-02 DIAGNOSIS — I10 Essential (primary) hypertension: Secondary | ICD-10-CM

## 2023-12-02 NOTE — ED Provider Triage Note (Signed)
 Emergency Medicine Provider Triage Evaluation Note  Kevin Barton , a 63 y.o. male  was evaluated in triage.  Pt complains of left hip pain, states he has a diagnosis of osteoarthritis and has chronic pain and said hip however presents as pain is acutely exacerbated.  While he is seated at rest, he states that it is tolerable however when he ever tries to bear weight this increases his pain.  No recent falls or injuries.  Does have upcoming surgery for the right knee in the coming week..  Review of Systems  Positive: As above Negative:   Physical Exam  BP 129/72 (BP Location: Right Arm)   Pulse 78   Temp 98 F (36.7 C)   Resp 18   SpO2 94%  Gen:   Awake, no distress   Resp:  Normal effort  MSK:   Moves extremities without difficulty  Other:  No tenderness to the greater trochanter, no deformities appreciated  Medical Decision Making  Medically screening exam initiated at 9:07 PM.  Appropriate orders placed.  Kevin Barton was informed that the remainder of the evaluation will be completed by another provider, this initial triage assessment does not replace that evaluation, and the importance of remaining in the ED until their evaluation is complete.  Initial imaging orders placed.   Kevin Barton, [place] 12/02/23 2109

## 2023-12-02 NOTE — ED Triage Notes (Signed)
 Patient states he needs injections in his hip because it is locked up and he is supposed to have surgery on Monday.

## 2023-12-03 MED ORDER — HYDROMORPHONE HCL 1 MG/ML IJ SOLN
1.0000 mg | Freq: Once | INTRAMUSCULAR | Status: AC
  Administered 2023-12-03: 1 mg via INTRAMUSCULAR
  Filled 2023-12-03: qty 1

## 2023-12-03 NOTE — ED Provider Notes (Signed)
 [facility]-EM[facility]Y DEPT [facility] Emergency Department Provider Note MRN:  [id number]  Arrival date & time: 12/03/23     Chief Complaint   Hip Pain   History of Present Illness   Kevin Barton is a 63 y.o. year-old male presents to the ED with chief complaint of left hip pain.  Hx of the same.  States that he is due to have knee replacement in about a week and then is going to have hip replacement.  He denies any new injuries.  He states that he doesn't have anything at home that he takes for pain.  States that the pain cause his leg to lock up.  History provided by patient.   Review of Systems  Pertinent positive and negative review of systems noted in HPI.    Physical Exam   Vitals:   12/03/23 0435 12/03/23 0509  BP: 135/67 124/71  Pulse: 61 78  Resp: 16 17  Temp: 98 F (36.7 C) 97.7 F (36.5 C)  SpO2: 96% 100%    CONSTITUTIONAL:  non toxic-appearing, NAD NEURO:  Alert and oriented x 3, CN 3-12 grossly intact EYES:  eyes equal and reactive ENT/NECK:  Supple, no stridor  CARDIO:  normal rate, appears well-perfused  PULM:  No respiratory distress,  GI/GU:  non-distended,  MSK/SPINE:  No gross deformities, no edema, moves all extremities, slightly decreased left hip ROM 2/2 pain, no obvious deformity SKIN:  no rash,  atraumatic   *Additional and/or pertinent findings included in MDM below  Diagnostic and Interventional Summary    EKG Interpretation Date/Time:    Ventricular Rate:    PR Interval:    QRS Duration:    QT Interval:    QTC Calculation:   R Axis:      Text Interpretation:         Labs Reviewed - No data to display  DG Hip Unilat With Pelvis 2-3 Views Left  Final Result      Medications  HYDROmorphone (DILAUDID) injection 1 mg (1 mg Intramuscular Given 12/03/23 0527)     Procedures  /  Critical Care Procedures  ED Course and Medical Decision Making  I have reviewed the triage vital signs, the nursing notes, and pertinent available  records from the EMR.  Social Determinants Affecting Complexity of Care: Patient has no clinically significant social determinants affecting this chief complaint..   ED Course:    Medical Decision Making Patient here with left hip pain. This is not a new problem.  He states that he just needs a shot of pain medicine to help take the edge off.  Vitals are stable.  Will give 1mg  IM dilaudid for pain control.   Imaging notable for severe degenerative changes that are similar to prior studies.  He is going to followup with orthopedics.  Risk Prescription drug management.         Consultants: No consultations were needed in caring for this patient.   Treatment and Plan: Emergency department workup does not suggest an emergent condition requiring admission or immediate intervention beyond  what has been performed at this time. The patient is safe for discharge and has  been instructed to return immediately for worsening symptoms, change in  symptoms or any other concerns    Final Clinical Impressions(s) / ED Diagnoses     ICD-10-CM   1. Left hip pain  M25.552       ED Discharge Orders     None  Discharge Instructions Discussed with and Provided to Patient:   Discharge Instructions   None      Vicky Charleston, PA-C 12/03/23 0543    Theadore Ozell HERO, MD 12/03/23 [phone]

## 2023-12-04 ENCOUNTER — Other Ambulatory Visit: Payer: Self-pay | Admitting: Physician Assistant

## 2023-12-04 MED ORDER — ONDANSETRON HCL 4 MG PO TABS: 4.0000 mg | ORAL_TABLET | Freq: Three times a day (TID) | ORAL | 0 refills | Status: DC | PRN

## 2023-12-04 MED ORDER — DOCUSATE SODIUM 100 MG PO CAPS: 100.0000 mg | ORAL_CAPSULE | Freq: Every day | ORAL | 2 refills | Status: DC | PRN

## 2023-12-04 MED ORDER — OXYCODONE-ACETAMINOPHEN 5-325 MG PO TABS: 1.0000 | ORAL_TABLET | Freq: Three times a day (TID) | ORAL | 0 refills | Status: DC | PRN

## 2023-12-04 MED ORDER — METHOCARBAMOL 750 MG PO TABS: 750.0000 mg | ORAL_TABLET | Freq: Three times a day (TID) | ORAL | 2 refills | Status: AC | PRN

## 2023-12-04 NOTE — Pre-Procedure Instructions (Signed)
 Surgical Instructions   Your procedure is scheduled on Monday, October 13th.  Report to [facility] Main Entrance A at 0920 A.M., then check in with the Admitting office. Any questions or running late day of surgery: call [phone]  Questions prior to your surgery date: call [phone], Monday-Friday, 8am-4pm. If you experience any cold or flu symptoms such as cough, fever, chills, shortness of breath, etc. between now and your scheduled surgery, please notify us  at the above number.     Remember:  Do not eat after midnight the night before your surgery  You may drink clear liquids until 0850 the morning of your surgery.   Clear liquids allowed are: Water, Non-Citrus Juices (without pulp), Carbonated Beverages, Clear Tea (no milk, honey, etc.), Black Coffee Only (NO MILK, CREAM OR POWDERED CREAMER of any kind), and Gatorade.  Patient Instructions  The night before surgery:  No food after midnight. ONLY clear liquids after midnight  The day of surgery (if you do NOT have diabetes):  Drink ONE (1) Pre-Surgery Clear Ensure by 0850 the morning of surgery. Drink in one sitting. Do not sip.  This drink was given to you during your hospital  pre-op  appointment visit.  Nothing else to drink after completing the  Pre-Surgery Clear Ensure.          If you have questions, please contact your surgeon's office.     Take these medicines the morning of surgery with A SIP OF WATER: amLODipine  (NORVASC )  amoxicillin -clavulanate (AUGMENTIN )  docusate sodium  (COLACE)   May take these medicines IF NEEDED: cyclobenzaprine  (FLEXERIL )  methocarbamol  (ROBAXIN )  ondansetron  (ZOFRAN )   Please follow your surgeon's instructions on when to stop BRILINTA . If no instructions were given please call dr. Benjiman office.    One week prior to surgery, STOP taking any Aspirin  (unless otherwise instructed by your surgeon) Aleve , Naproxen , Ibuprofen , Motrin , Advil , Goody's, BC's, all herbal medications,  fish oil, and non-prescription vitamins. This includes naproxen  (NAPROSYN .)                      Do NOT Smoke (Tobacco/Vaping) for 24 hours prior to your procedure.  If you use a CPAP at night, you may bring your mask/headgear for your overnight stay.   You will be asked to remove any contacts, glasses, piercing's, hearing aid's, dentures/partials prior to surgery. Please bring cases for these items if needed.    Patients discharged the day of surgery will not be allowed to drive home, and someone needs to stay with them for 24 hours.  SURGICAL WAITING ROOM VISITATION Patients may have no more than 2 support people in the waiting area - these visitors may rotate.   Pre-op  nurse will coordinate an appropriate time for 1 ADULT support person, who may not rotate, to accompany patient in pre-op .  Children under the age of 48 must have an adult with them who is not the patient and must remain in the main waiting area with an adult.  If the patient needs to stay at the hospital during part of their recovery, the visitor guidelines for inpatient rooms apply.  Please refer to the [facility] website for the visitor guidelines for any additional information.   If you received a COVID test during your pre-op  visit  it is requested that you wear a mask when out in public, stay away from anyone that may not be feeling well and notify your surgeon if you develop symptoms. If you have been in contact  with anyone that has tested positive in the last 10 days please notify you surgeon.      Pre-operative 4 CHG Bathing Instructions   You can play a key role in reducing the risk of infection after surgery. Your skin needs to be as free of germs as possible. You can reduce the number of germs on your skin by washing with CHG (chlorhexidine  gluconate) soap before surgery. CHG is an antiseptic soap that kills germs and continues to kill germs even after washing.   DO NOT use if you have an allergy to  chlorhexidine /CHG or antibacterial soaps. If your skin becomes reddened or irritated, stop using the CHG and notify one of our RNs at [phone].   Please shower with the CHG soap starting 4 days before surgery using the following schedule:     Please keep in mind the following:  DO NOT shave, including legs and underarms, starting the day of your first shower.   You may shave your face at any point before/day of surgery.  Place clean sheets on your bed the day you start using CHG soap. Use a clean washcloth (not used since being washed) for each shower. DO NOT sleep with pets once you start using the CHG.   CHG Shower Instructions:  Wash your face and private area with normal soap. If you choose to wash your hair, wash first with your normal shampoo.  After you use shampoo/soap, rinse your hair and body thoroughly to remove shampoo/soap residue.  Turn the water OFF and apply  bottle of CHG soap to a CLEAN washcloth.  Apply CHG soap ONLY FROM YOUR NECK DOWN TO YOUR TOES (washing for 3-5 minutes)  DO NOT use CHG soap on face, private areas, open wounds, or sores.  Pay special attention to the area where your surgery is being performed.  If you are having back surgery, having someone wash your back for you may be helpful. Wait 2 minutes after CHG soap is applied, then you may rinse off the CHG soap.  Pat dry with a clean towel  Put on clean clothes/pajamas   If you choose to wear lotion, please use ONLY the CHG-compatible lotions that are listed below.  Additional instructions for the day of surgery:  If you choose, you may shower the morning of surgery with an antibacterial soap.  DO NOT APPLY any lotions, deodorants, cologne, or perfumes.   Do not bring valuables to the hospital. [facility] is not responsible for any belongings/valuables. Do not wear nail polish, gel polish, artificial nails, or any other type of covering on natural nails (fingers and toes) Do not wear jewelry or  makeup Put on clean/comfortable clothes.  Please brush your teeth.  Ask your nurse before applying any prescription medications to the skin.     CHG Compatible Lotions   Aveeno Moisturizing lotion  Cetaphil Moisturizing Cream  Cetaphil Moisturizing Lotion  Clairol Herbal Essence Moisturizing Lotion, Dry Skin  Clairol Herbal Essence Moisturizing Lotion, Extra Dry Skin  Clairol Herbal Essence Moisturizing Lotion, Normal Skin  Curel Age Defying Therapeutic Moisturizing Lotion with Alpha Hydroxy  Curel Extreme Care Body Lotion  Curel Soothing Hands Moisturizing Hand Lotion  Curel Therapeutic Moisturizing Cream, Fragrance-Free  Curel Therapeutic Moisturizing Lotion, Fragrance-Free  Curel Therapeutic Moisturizing Lotion, Original Formula  Eucerin Daily Replenishing Lotion  Eucerin Dry Skin Therapy Plus Alpha Hydroxy Crme  Eucerin Dry Skin Therapy Plus Alpha Hydroxy Lotion  Eucerin Original Crme  Eucerin Original Lotion  Eucerin Plus Crme  Eucerin Plus Lotion  Eucerin TriLipid Replenishing Lotion  Keri Anti-Bacterial Hand Lotion  Keri Deep Conditioning Original Lotion Dry Skin Formula Softly Scented  Keri Deep Conditioning Original Lotion, Fragrance Free Sensitive Skin Formula  Keri Lotion Fast Absorbing Fragrance Free Sensitive Skin Formula  Keri Lotion Fast Absorbing Softly Scented Dry Skin Formula  Keri Original Lotion  Keri Skin Renewal Lotion Keri Silky Smooth Lotion  Keri Silky Smooth Sensitive Skin Lotion  Nivea Body Creamy Conditioning Oil  Nivea Body Extra Enriched Lotion  Nivea Body Original Lotion  Nivea Body Sheer Moisturizing Lotion Nivea Crme  Nivea Skin Firming Lotion  NutraDerm 30 Skin Lotion  NutraDerm Skin Lotion  NutraDerm Therapeutic Skin Cream  NutraDerm Therapeutic Skin Lotion  ProShield Protective Hand Cream  Provon moisturizing lotion  Please read over the following fact sheets that you were given.

## 2023-12-05 ENCOUNTER — Inpatient Hospital Stay (HOSPITAL_COMMUNITY)
Admission: RE | Admit: 2023-12-05 | Discharge: 2023-12-05 | Disposition: A | Discharge status: Home / Self Care | Source: Ambulatory Visit

## 2023-12-06 ENCOUNTER — Encounter (HOSPITAL_COMMUNITY): Payer: Self-pay

## 2023-12-06 ENCOUNTER — Encounter (HOSPITAL_COMMUNITY): Payer: Self-pay | Admitting: Orthopaedic Surgery

## 2023-12-06 ENCOUNTER — Encounter (HOSPITAL_COMMUNITY): Payer: Self-pay | Admitting: Medical

## 2023-12-06 ENCOUNTER — Other Ambulatory Visit: Payer: Self-pay

## 2023-12-06 MED ORDER — TRANEXAMIC ACID 1000 MG/10ML IV SOLN
2000.0000 mg | INTRAVENOUS | Status: DC
  Filled 2023-12-06: qty 20

## 2023-12-06 NOTE — Progress Notes (Signed)
 PCP - Talitha Repress, MD Cardiologist - Ozell Fell, MD  EKG - 03/19/23 Stress Test - 06/13/21 ECHO - 10/06/17 Cardiac Cath - 07/25/16  Blood Thinner Instructions: Has been off Brilinta  for four months Aspirin  Instructions: LD 10/9  Anesthesia review: Y  Patient verbally denies any shortness of breath, fever, cough and chest pain during phone call   -------------  SDW INSTRUCTIONS given:  Your procedure is scheduled on Monday, Oct 13th.  Report to [facility] Main Entrance A at 0920 A.M., and check in at the Admitting office.  Call this number if you have problems the morning of surgery:  [phone]   Remember:  Do not eat after midnight the night before your surgery  You may drink clear liquids until 0850 the morning of your surgery.   Clear liquids allowed are: Water, Non-Citrus Juices (without pulp), Carbonated Beverages, Clear Tea, Black Coffee Only, and Gatorade    Take these medicines the morning of surgery with A SIP OF WATER  amLODipine  (NORVASC )  docusate sodium  (COLACE)  cyclobenzaprine  (FLEXERIL )-IF NEEDED methocarbamol  (ROBAXIN )-IF NEEDED ondansetron  (ZOFRAN )-IF NEEDED  As of today, STOP taking any Aspirin  (unless otherwise instructed by your surgeon) Aleve , Naproxen , Ibuprofen , Motrin , Advil , Goody's, BC's, all herbal medications, fish oil, and all vitamins.                      Do not wear jewelry, make up, or nail polish            Do not wear lotions, powders, perfumes/colognes, or deodorant.            Do not shave 48 hours prior to surgery.  Men may shave face and neck.            Do not bring valuables to the hospital.            [facility] is not responsible for any belongings or valuables.  Do NOT Smoke (Tobacco/Vaping) 24 hours prior to your procedure If you use a CPAP at night, you may bring all equipment for your overnight stay.   Contacts, glasses, dentures or bridgework may not be worn into surgery.      For patients admitted to the  hospital, discharge time will be determined by your treatment team.   Patients discharged the day of surgery will not be allowed to drive home, and someone needs to stay with them for 24 hours.    Special instructions:   [place]- Preparing For Surgery  Before surgery, you can play an important role. Because skin is not sterile, your skin needs to be as free of germs as possible. You can reduce the number of germs on your skin by washing with CHG (chlorahexidine gluconate) Soap before surgery.  CHG is an antiseptic cleaner which kills germs and bonds with the skin to continue killing germs even after washing.    Oral Hygiene is also important to reduce your risk of infection.  Remember - BRUSH YOUR TEETH THE MORNING OF SURGERY WITH YOUR REGULAR TOOTHPASTE  Please do not use if you have an allergy to CHG or antibacterial soaps. If your skin becomes reddened/irritated stop using the CHG.  Do not shave (including legs and underarms) for at least 48 hours prior to first CHG shower. It is OK to shave your face.  Please follow these instructions carefully.   Shower the NIGHT BEFORE SURGERY and the MORNING OF SURGERY with DIAL Soap.   Pat yourself dry with a CLEAN TOWEL.  Wear CLEAN PAJAMAS to bed the night before surgery  Place CLEAN SHEETS on your bed the night of your first shower and DO NOT SLEEP WITH PETS.   Day of Surgery: Please shower morning of surgery  Wear Clean/Comfortable clothing the morning of surgery Do not apply any deodorants/lotions.   Remember to brush your teeth WITH YOUR REGULAR TOOTHPASTE.   Questions were answered. Patient verbalized understanding of instructions.

## 2023-12-06 NOTE — Progress Notes (Signed)
 Case: [id number] Date/Time: 12/09/23 1135   Procedure: RIGHT TOTAL KNEE ARTHROPLASTY (Right: Knee)   Anesthesia type: Spinal   Diagnosis: Primary osteoarthritis of right knee [M17.11]   Pre-op  diagnosis: RIGHT KNEE DEGENERATIVE JOINT DISEASE   [facility] RO[facility] 05 / [facility]   Surgeons: Jerri Kay HERO, MD       DISCUSSION: Kevin Barton is a 63 yo male with PMH of every day smoking, HTN, hx of NSTEMI (2018), CAD (by cath), hx of CVA (2019), hx of SUD (ETOH, cocaine, marijuana), arthritis.  Of note patient had in person visit but was a no show. Pt with multiple ED visits over the past month for acute on chronic joint pain.  Patient with hx of NSTEMI in 2018 and CAD by cath (occlusion of the RCA with collaterals and with no sig CAD elsewhere) treated medically. Stress testing in 2023 was low risk. He was last seen in clinic on 06/05/2022. Noted to be stable at that visit. Advised continue current medicines and follow up in one year. He received cardiac clearance for R TKA at that visit however surgery has been delayed multiple times.   History of CVA in 2019 and underwent R MCA IR intervention and ICA stenting. Previously on ASA and Brilinta . Pt reports he came off Brilinta  ~June 2025.   Patient has poor, fragmented f/u with frequent ED visits (20+ this year). Will need eval DOS.  VS:  Wt Readings from Last 3 Encounters:  11/25/23 79.8 kg  11/23/23 77.6 kg  10/23/23 77.6 kg   Temp Readings from Last 3 Encounters:  12/03/23 36.4 C (Oral)  11/28/23 36.6 C  11/25/23 36.7 C   BP Readings from Last 3 Encounters:  12/03/23 (!) 156/83  11/28/23 (!) 147/75  11/25/23 (!) 146/73   Pulse Readings from Last 3 Encounters:  12/03/23 85  11/28/23 83  11/25/23 62     PROVIDERS: Austin Mutton, MD   LABS: Obtain DOS   EKG 03/18/23:  Normal sinus rhythm Nonspecific T wave abnormality Abnormal ECG  Stress test 06/13/2021:    The study is normal. The study is low risk.   No ST deviation  was noted.   Left ventricular function is normal. Nuclear stress EF: 57 %. The left ventricular ejection fraction is normal (55-65%). End diastolic cavity size is normal. End systolic cavity size is normal.   Prior study not available for comparison.   Fixed inferior perfusion defect with normal wall motion consistent with artifact Low risk study  Echo 10/06/2017:  Study Conclusions  - Left ventricle: The cavity size was normal. Wall thickness was   increased in a pattern of moderate LVH. Systolic function was   vigorous. The estimated ejection fraction was in the range of 65%   to 70%. Wall motion was normal; there were no regional wall   motion abnormalities. Features are consistent with a pseudonormal   left ventricular filling pattern, with concomitant abnormal   relaxation and increased filling pressure (grade 2 diastolic   dysfunction). - Aortic valve: There was no stenosis. - Mitral valve: Mildly calcified annulus. Mildly calcified leaflets   . There was trivial regurgitation. - Right ventricle: The cavity size was normal. Systolic function   was normal. - Pulmonary arteries: No complete TR doppler jet so unable to   estimate PA systolic pressure. - Inferior vena cava: The vessel was normal in size. The   respirophasic diameter changes were in the normal range (>= 50%),   consistent with normal central  venous pressure.  LHC 07/25/2016:  1. Severe single vessel CAD with total occlusion of the RCA, collateralized from the left coronary artery 2. Mild nonobstructive plaque in the left main, LAD, and LCx 3. Mild segmental LV dysfunction, LVEF 50%   Recommend: medical therapy  Past Medical History:  Diagnosis Date   Arthritis    CAD (coronary artery disease)    cath 06/2016 occlusion of RCA with collateralization from the left coronary artery. Mild nonobstructive plaque in the left main, LAD, and LCx. LVEF 50%. TX Rx.    Chronic back pain    Chronic knee pain    HTN  (hypertension)    Hyperlipidemia    NSTEMI (non-ST elevated myocardial infarction) (HCC)    Stroke (HCC)    Substance abuse (HCC)    Tobacco abuse     Past Surgical History:  Procedure Laterality Date   IR ANGIO INTRA EXTRACRAN SEL COM CAROTID INNOMINATE UNI L MOD SED  10/05/2017   IR ANGIO VERTEBRAL SEL SUBCLAVIAN INNOMINATE UNI R MOD SED  10/05/2017   IR CT HEAD LTD  10/05/2017   IR INTRAVSC STENT CERV CAROTID W/O EMB-PROT MOD SED INC ANGIO  10/05/2017   IR PERCUTANEOUS ART THROMBECTOMY/INFUSION INTRACRANIAL INC DIAG ANGIO  10/05/2017   LEFT HEART CATH AND CORONARY ANGIOGRAPHY N/A 07/25/2016   Procedure: Left Heart Cath and Coronary Angiography;  Surgeon: Wonda Sharper, MD;  Location: [facility] INVASIVE CV LAB;  Service: Cardiovascular;  Laterality: N/A;   RADIOLOGY WITH ANESTHESIA N/A 10/05/2017   Procedure: IR WITH ANESTHESIA CODE STROKE;  Surgeon: Dolphus Carrion, MD;  Location: [facility];  Service: Radiology;  Laterality: N/A;    MEDICATIONS:  docusate sodium  (COLACE) 100 MG capsule   methocarbamol  (ROBAXIN ) 750 MG tablet   ondansetron  (ZOFRAN ) 4 MG tablet   oxyCODONE -acetaminophen  (PERCOCET) 5-325 MG tablet   amLODipine  (NORVASC ) 5 MG tablet   amoxicillin -clavulanate (AUGMENTIN ) 875-125 MG tablet   aspirin  EC 81 MG tablet   atorvastatin  (LIPITOR ) 80 MG tablet   BAYER LOW DOSE 81 MG tablet   BRILINTA  90 MG TABS tablet   cyclobenzaprine  (FLEXERIL ) 10 MG tablet   furosemide  (LASIX ) 20 MG tablet   lidocaine  (LIDODERM ) 5 %   naproxen  (NAPROSYN ) 500 MG tablet   Vitamin D, Ergocalciferol, (DRISDOL) 1.25 MG (50000 UNIT) CAPS capsule   No current facility-administered medications for this encounter.    Burnard CHRISTELLA Odis DEVONNA [facility] Surgical Short Stay/Anesthesiology [facility] Phone [phone] 12/06/2023 10:09 AM

## 2023-12-08 ENCOUNTER — Emergency Department (HOSPITAL_COMMUNITY)
Admission: EM | Admit: 2023-12-08 | Discharge: 2023-12-08 | Disposition: A | Discharge status: Home / Self Care | Attending: Emergency Medicine | Admitting: Emergency Medicine

## 2023-12-08 ENCOUNTER — Other Ambulatory Visit: Payer: Self-pay

## 2023-12-08 DIAGNOSIS — Z7982 Long term (current) use of aspirin: Secondary | ICD-10-CM | POA: Insufficient documentation

## 2023-12-08 DIAGNOSIS — M25552 Pain in left hip: Secondary | ICD-10-CM | POA: Insufficient documentation

## 2023-12-08 DIAGNOSIS — Z8673 Personal history of transient ischemic attack (TIA), and cerebral infarction without residual deficits: Secondary | ICD-10-CM | POA: Insufficient documentation

## 2023-12-08 DIAGNOSIS — I10 Essential (primary) hypertension: Secondary | ICD-10-CM | POA: Insufficient documentation

## 2023-12-08 DIAGNOSIS — Z79899 Other long term (current) drug therapy: Secondary | ICD-10-CM | POA: Insufficient documentation

## 2023-12-08 DIAGNOSIS — I2510 Atherosclerotic heart disease of native coronary artery without angina pectoris: Secondary | ICD-10-CM | POA: Diagnosis not present

## 2023-12-08 MED ORDER — KETOROLAC TROMETHAMINE 15 MG/ML IJ SOLN
15.0000 mg | Freq: Once | INTRAMUSCULAR | Status: AC
  Administered 2023-12-08: 15 mg via INTRAMUSCULAR
  Filled 2023-12-08: qty 1

## 2023-12-08 NOTE — ED Triage Notes (Signed)
 Pt here for his chronic L hip pain.

## 2023-12-08 NOTE — Discharge Instructions (Signed)
 Your workup is reassuring today.  Looks like you are scheduled for surgery in the morning, please sure you are on time for this.  Please take your home pain medication for additional symptomatic relief.    Return if development of any new or worsening symptoms.

## 2023-12-08 NOTE — ED Provider Notes (Signed)
 [place] EMERGENCY DEPARTMENT AT [facility] Provider Note   CSN: [id number] Arrival date & time: 12/08/23  1316     Patient presents with: Hip Pain   Kevin Barton is a 63 y.o. male.   Patient with history of arthritis, CAD, hypertension, hyperlipidemia, NSTEMI, CVA presents today with complaints of chronic left hip pain.  Reports history of same.  Denies any new trauma or injuries.  Reports that the rainy weather causes his hip to hurt.  States he has come here before and received a shot of medication which helps him substantially.  He presents requesting same.  He is scheduled to have a knee replacement tomorrow and in the plan is to replace his hip as well.  The history is provided by the patient. No language interpreter was used.  Hip Pain       Prior to Admission medications   Medication Sig Start Date End Date Taking? Authorizing Provider  docusate sodium  (COLACE) 100 MG capsule Take 1 capsule (100 mg total) by mouth daily as needed. 12/04/23 12/03/24  Jule Ronal CROME, PA-C  methocarbamol  (ROBAXIN ) 750 MG tablet Take 1 tablet (750 mg total) by mouth 3 (three) times daily as needed. 12/04/23   Jule Ronal CROME, PA-C  ondansetron  (ZOFRAN ) 4 MG tablet Take 1 tablet (4 mg total) by mouth every 8 (eight) hours as needed for nausea or vomiting. 12/04/23   Jule Ronal CROME, PA-C  oxyCODONE -acetaminophen  (PERCOCET) 5-325 MG tablet Take 1-2 tablets by mouth every 8 (eight) hours as needed. To be taken after surgery 12/04/23   Jule Ronal CROME, PA-C  amLODipine  (NORVASC ) 5 MG tablet Take 1 tablet (5 mg total) by mouth daily. 11/14/21   Wonda Sharper, MD  amoxicillin -clavulanate (AUGMENTIN ) 875-125 MG tablet Take 1 tablet by mouth every 12 (twelve) hours. 07/28/23   Logan Ubaldo NOVAK, PA-C  aspirin  EC 81 MG tablet Take 1 tablet (81 mg total) by mouth 2 (two) times daily. To be taken after surgery to prevent blood clots if not on any other blood thinners Patient not taking:  Reported on 03/18/2023 12/19/22 12/19/23  Jule Ronal CROME, PA-C  atorvastatin  (LIPITOR ) 80 MG tablet Take 1 tablet (80 mg total) by mouth daily at 6 PM. 11/14/21   Wonda Sharper, MD  BAYER LOW DOSE 81 MG tablet Take 81 mg by mouth daily. Swallow whole.    [provider]  BRILINTA  90 MG TABS tablet Take 90 mg by mouth in the morning.    [provider]  cyclobenzaprine  (FLEXERIL ) 10 MG tablet Take 1 tablet (10 mg total) by mouth 2 (two) times daily as needed for muscle spasms. 06/06/23   Ruthell Lonni FALCON, PA-C  furosemide  (LASIX ) 20 MG tablet Take 1 tablet (20 mg total) by mouth daily. 08/22/23   Horton, Charmaine FALCON, MD  lidocaine  (LIDODERM ) 5 % Place 1 patch onto the skin daily. Remove & Discard patch within 12 hours or as directed by MD 11/23/23   Randol Simmonds, MD  naproxen  (NAPROSYN ) 500 MG tablet Take 1 tablet (500 mg total) by mouth 2 (two) times daily. 11/03/23   Randol Simmonds, MD  Vitamin D, Ergocalciferol, (DRISDOL) 1.25 MG (50000 UNIT) CAPS capsule Take 50,000 Units by mouth once a week. 10/01/22   [provider]    Allergies: Patient has no known allergies.    Review of Systems  Musculoskeletal:  Positive for arthralgias.  All other systems reviewed and are negative.   Updated Vital Signs BP (!) 147/91 (BP  Location: Right Arm)   Pulse 75   Temp 97.9 F (36.6 C)   Resp 17   SpO2 98%   Physical Exam Vitals and nursing note reviewed.  Constitutional:      General: He is not in acute distress.    Appearance: Normal appearance. He is normal weight. He is not ill-appearing, toxic-appearing or diaphoretic.  HENT:     Head: Normocephalic and atraumatic.  Cardiovascular:     Rate and Rhythm: Normal rate.  Pulmonary:     Effort: Pulmonary effort is normal. No respiratory distress.  Musculoskeletal:        General: Normal range of motion.     Cervical back: Normal range of motion.     Comments: No tenderness to palpation of the cervical, thoracic, or lumbar  spine.  No step-offs, lesions, deformity, or overlying skin changes.  Generalized TTP noted to the left hip with slightly diminished ROM due to pain.  No obvious deformity, swelling, or bruising.  DP and PT pulses are intact and 2+   Skin:    General: Skin is warm and dry.  Neurological:     General: No focal deficit present.     Mental Status: He is alert.  Psychiatric:        Mood and Affect: Mood normal.        Behavior: Behavior normal.     (all labs ordered are listed, but only abnormal results are displayed) Labs Reviewed - No data to display  EKG: None  Radiology: No results found.   Procedures   Medications Ordered in the ED  ketorolac  (TORADOL ) 15 MG/ML injection 15 mg (has no administration in time range)                                    Medical Decision Making Risk Prescription drug management.   Patient presents today with a flare of his chronic left hip pain.  He is afebrile, nontoxic-appearing, and in no acute distress reassuring vital signs.  Physical exam reveals no tenderness to palpation of the cervical, thoracic, or lumbar spine.  No step-offs, lesions, deformity, or overlying skin changes.  Generalized TTP noted to the left hip with slightly diminished ROM due to pain.  No obvious deformity, swelling, or bruising.  DP and PT pulses are intact and 2+.  Chart review, patient with numerous previous visits for similar complaints, has had several hip x-rays without acute findings.  Looks like he usually gets IM Toradol  and goes home.  He is requesting same, given with improvement.  Looks like his last creatinine was normal.  Evaluation and diagnostic testing in the emergency department does not suggest an emergent condition requiring admission or immediate intervention beyond what has been performed at this time.  Plan for discharge with close PCP follow-up.  Patient is understanding and amenable with plan, educated on red flag symptoms that would prompt  immediate return.  Patient discharged in stable condition.  Final diagnoses:  Left hip pain    ED Discharge Orders     None     An After Visit Summary was printed and given to the patient.      Nora Lauraine DELENA DEVONNA 12/08/23 1344    Geraldene Hamilton, MD 12/08/23 2045

## 2023-12-09 ENCOUNTER — Encounter (HOSPITAL_COMMUNITY): Payer: Self-pay

## 2023-12-09 ENCOUNTER — Encounter (HOSPITAL_COMMUNITY): Payer: Self-pay | Admitting: Orthopaedic Surgery

## 2023-12-09 ENCOUNTER — Other Ambulatory Visit: Payer: Self-pay

## 2023-12-09 ENCOUNTER — Encounter (HOSPITAL_COMMUNITY)
Admission: RE | Disposition: A | Discharge status: Home / Self Care | Payer: Self-pay | Source: Home / Self Care | Attending: Orthopaedic Surgery

## 2023-12-09 ENCOUNTER — Ambulatory Visit (HOSPITAL_COMMUNITY)
Admission: RE | Admit: 2023-12-09 | Discharge: 2023-12-09 | Disposition: A | Discharge status: Home / Self Care | Attending: Orthopaedic Surgery | Admitting: Orthopaedic Surgery

## 2023-12-09 DIAGNOSIS — Z79899 Other long term (current) drug therapy: Secondary | ICD-10-CM | POA: Insufficient documentation

## 2023-12-09 DIAGNOSIS — Z791 Long term (current) use of non-steroidal anti-inflammatories (NSAID): Secondary | ICD-10-CM | POA: Diagnosis not present

## 2023-12-09 DIAGNOSIS — M1711 Unilateral primary osteoarthritis, right knee: Secondary | ICD-10-CM | POA: Diagnosis present

## 2023-12-09 DIAGNOSIS — F1490 Cocaine use, unspecified, uncomplicated: Secondary | ICD-10-CM

## 2023-12-09 DIAGNOSIS — Z833 Family history of diabetes mellitus: Secondary | ICD-10-CM | POA: Insufficient documentation

## 2023-12-09 DIAGNOSIS — F17210 Nicotine dependence, cigarettes, uncomplicated: Secondary | ICD-10-CM | POA: Insufficient documentation

## 2023-12-09 DIAGNOSIS — Z7982 Long term (current) use of aspirin: Secondary | ICD-10-CM | POA: Diagnosis not present

## 2023-12-09 DIAGNOSIS — Z5309 Procedure and treatment not carried out because of other contraindication: Secondary | ICD-10-CM | POA: Insufficient documentation

## 2023-12-09 LAB — RAPID URINE DRUG SCREEN, HOSP PERFORMED
Amphetamines: NOT DETECTED
Barbiturates: NOT DETECTED
Benzodiazepines: NOT DETECTED
Cocaine: POSITIVE — AB
Opiates: NOT DETECTED
Tetrahydrocannabinol: NOT DETECTED

## 2023-12-09 LAB — CBC
HCT: 41.1 % (ref 39.0–52.0)
Hemoglobin: 14.2 g/dL (ref 13.0–17.0)
MCH: 31.1 pg (ref 26.0–34.0)
MCHC: 34.5 g/dL (ref 30.0–36.0)
MCV: 89.9 fL (ref 80.0–100.0)
Platelets: 196 K/uL (ref 150–400)
RBC: 4.57 MIL/uL (ref 4.22–5.81)
RDW: 15.3 % (ref 11.5–15.5)
WBC: 6.1 K/uL (ref 4.0–10.5)
nRBC: 0 % (ref 0.0–0.2)

## 2023-12-09 LAB — SURGICAL PCR SCREEN
MRSA, PCR: NEGATIVE
Staphylococcus aureus: POSITIVE — AB

## 2023-12-09 LAB — POCT I-STAT, CHEM 8
BUN: 10 mg/dL (ref 8–23)
Calcium, Ion: 0.94 mmol/L — ABNORMAL LOW (ref 1.15–1.40)
Chloride: 110 mmol/L (ref 98–111)
Creatinine, Ser: 1 mg/dL (ref 0.61–1.24)
Glucose, Bld: 96 mg/dL (ref 70–99)
HCT: 41 % (ref 39.0–52.0)
Hemoglobin: 13.9 g/dL (ref 13.0–17.0)
Potassium: 4.8 mmol/L (ref 3.5–5.1)
Sodium: 136 mmol/L (ref 135–145)
TCO2: 21 mmol/L — ABNORMAL LOW (ref 22–32)

## 2023-12-09 SURGERY — ARTHROPLASTY, KNEE, TOTAL
Anesthesia: Spinal | Site: Knee | Laterality: Right

## 2023-12-09 MED ORDER — ACETAMINOPHEN 500 MG PO TABS
1000.0000 mg | ORAL_TABLET | Freq: Once | ORAL | Status: AC
  Administered 2023-12-09: 1000 mg via ORAL
  Filled 2023-12-09: qty 2

## 2023-12-09 MED ORDER — CHLORHEXIDINE GLUCONATE 0.12 % MT SOLN
OROMUCOSAL | Status: AC
  Administered 2023-12-09: 15 mL via OROMUCOSAL
  Filled 2023-12-09: qty 15

## 2023-12-09 MED ORDER — CHLORHEXIDINE GLUCONATE 0.12 % MT SOLN: 15.0000 mL | Freq: Once | OROMUCOSAL | Status: AC

## 2023-12-09 MED ORDER — TRANEXAMIC ACID-NACL 1000-0.7 MG/100ML-% IV SOLN
1000.0000 mg | INTRAVENOUS | Status: DC
  Filled 2023-12-09: qty 100

## 2023-12-09 MED ORDER — CEFAZOLIN SODIUM-DEXTROSE 2-4 GM/100ML-% IV SOLN
2.0000 g | INTRAVENOUS | Status: DC
  Filled 2023-12-09: qty 100

## 2023-12-09 MED ORDER — ORAL CARE MOUTH RINSE: 15.0000 mL | Freq: Once | OROMUCOSAL | Status: AC

## 2023-12-09 MED ORDER — POVIDONE-IODINE 10 % EX SWAB
2.0000 | Freq: Once | CUTANEOUS | Status: AC
  Administered 2023-12-09: 2 via TOPICAL

## 2023-12-09 MED ORDER — LACTATED RINGERS IV SOLN: INTRAVENOUS | Status: DC

## 2023-12-09 NOTE — Discharge Instructions (Signed)

## 2023-12-09 NOTE — H&P (Signed)
 PREOPERATIVE H&P  Chief Complaint: RIGHT KNEE DEGENERATIVE JOINT DISEASE  HPI: Kevin Barton is a 63 y.o. male who presents for surgical treatment of RIGHT KNEE DEGENERATIVE JOINT DISEASE.  He denies any changes in medical history.  Past Surgical History:  Procedure Laterality Date   IR ANGIO INTRA EXTRACRAN SEL COM CAROTID INNOMINATE UNI L MOD SED  10/05/2017   IR ANGIO VERTEBRAL SEL SUBCLAVIAN INNOMINATE UNI R MOD SED  10/05/2017   IR CT HEAD LTD  10/05/2017   IR INTRAVSC STENT CERV CAROTID W/O EMB-PROT MOD SED INC ANGIO  10/05/2017   IR PERCUTANEOUS ART THROMBECTOMY/INFUSION INTRACRANIAL INC DIAG ANGIO  10/05/2017   LEFT HEART CATH AND CORONARY ANGIOGRAPHY N/A 07/25/2016   Procedure: Left Heart Cath and Coronary Angiography;  Surgeon: Wonda Sharper, MD;  Location: [facility] INVASIVE CV LAB;  Service: Cardiovascular;  Laterality: N/A;   RADIOLOGY WITH ANESTHESIA N/A 10/05/2017   Procedure: IR WITH ANESTHESIA CODE STROKE;  Surgeon: Dolphus Carrion, MD;  Location: [facility] OR;  Service: Radiology;  Laterality: N/A;   Social History   Socioeconomic History   Marital status: Single    Spouse name: Not on file   Number of children: Not on file   Years of education: Not on file   Highest education level: Not on file  Occupational History   Not on file  Tobacco Use   Smoking status: Every Day    Current packs/day: 0.50    Types: Cigarettes   Smokeless tobacco: Never   Tobacco comments:    smoke 6 per week  Vaping Use   Vaping status: Never Used  Substance and Sexual Activity   Alcohol use: Yes    Comment: a beer or two a day   Drug use: Yes    Types: Marijuana   Sexual activity: Yes  Other Topics Concern   Not on file  Social History Narrative   Not on file   Social Drivers of Health   Financial Resource Strain: High Risk (09/16/2019)   Overall Financial Resource Strain (CARDIA)    Difficulty of Paying Living Expenses: Hard  Food Insecurity: Food Insecurity Present (09/16/2019)    Hunger Vital Sign    Worried About Running Out of Food in the Last Year: Sometimes true    Ran Out of Food in the Last Year: Never true  Transportation Needs: No Transportation Needs (09/16/2019)   PRAPARE - Administrator, Civil Service (Medical): No    Lack of Transportation (Non-Medical): No  Physical Activity: Not on file  Stress: Not on file  Social Connections: Not on file   Family History  Problem Relation Age of Onset   Diabetes Brother    No Known Allergies Prior to Admission medications   Medication Sig Start Date End Date Taking? Authorizing Provider  aspirin  EC 81 MG tablet Take 1 tablet (81 mg total) by mouth 2 (two) times daily. To be taken after surgery to prevent blood clots if not on any other blood thinners 12/19/22 12/19/23 Yes Stanbery, Mary L, PA-C  BAYER LOW DOSE 81 MG tablet Take 81 mg by mouth daily. Swallow whole.   Yes [provider]  docusate sodium  (COLACE) 100 MG capsule Take 1 capsule (100 mg total) by mouth daily as needed. 12/04/23 12/03/24  Jule Ronal CROME, PA-C  methocarbamol  (ROBAXIN ) 750 MG tablet Take 1 tablet (750 mg total) by mouth 3 (three) times daily as needed. 12/04/23   Jule Ronal CROME, PA-C  ondansetron  (ZOFRAN ) 4 MG  tablet Take 1 tablet (4 mg total) by mouth every 8 (eight) hours as needed for nausea or vomiting. 12/04/23   Jule Ronal CROME, PA-C  oxyCODONE -acetaminophen  (PERCOCET) 5-325 MG tablet Take 1-2 tablets by mouth every 8 (eight) hours as needed. To be taken after surgery 12/04/23   Jule Ronal CROME, PA-C  Vitamin D, Ergocalciferol, (DRISDOL) 1.25 MG (50000 UNIT) CAPS capsule Take 50,000 Units by mouth once a week. 10/01/22  Yes [provider]  amLODipine  (NORVASC ) 5 MG tablet Take 1 tablet (5 mg total) by mouth daily. 11/14/21   Wonda Sharper, MD  amoxicillin -clavulanate (AUGMENTIN ) 875-125 MG tablet Take 1 tablet by mouth every 12 (twelve) hours. 07/28/23   Logan Ubaldo NOVAK, PA-C  atorvastatin  (LIPITOR ) 80  MG tablet Take 1 tablet (80 mg total) by mouth daily at 6 PM. 11/14/21   Wonda Sharper, MD  BRILINTA  90 MG TABS tablet Take 90 mg by mouth in the morning.    [provider]  cyclobenzaprine  (FLEXERIL ) 10 MG tablet Take 1 tablet (10 mg total) by mouth 2 (two) times daily as needed for muscle spasms. 06/06/23   Ruthell Lonni FALCON, PA-C  furosemide  (LASIX ) 20 MG tablet Take 1 tablet (20 mg total) by mouth daily. 08/22/23   Horton, Charmaine FALCON, MD  lidocaine  (LIDODERM ) 5 % Place 1 patch onto the skin daily. Remove & Discard patch within 12 hours or as directed by MD 11/23/23   Randol Simmonds, MD  naproxen  (NAPROSYN ) 500 MG tablet Take 1 tablet (500 mg total) by mouth 2 (two) times daily. 11/03/23   Randol Simmonds, MD     Positive ROS: All other systems have been reviewed and were otherwise negative with the exception of those mentioned in the HPI and as above.  Physical Exam: General: Alert, no acute distress Cardiovascular: No pedal edema Respiratory: No cyanosis, no use of accessory musculature GI: abdomen soft Skin: No lesions in the area of chief complaint Neurologic: Sensation intact distally Psychiatric: Patient is competent for consent with normal mood and affect Lymphatic: no lymphedema  MUSCULOSKELETAL: exam stable  Assessment: RIGHT KNEE DEGENERATIVE JOINT DISEASE  Plan: Plan for Procedure(s): RIGHT TOTAL KNEE ARTHROPLASTY  The risks benefits and alternatives were discussed with the patient including but not limited to the risks of nonoperative treatment, versus surgical intervention including infection, bleeding, nerve injury,  blood clots, cardiopulmonary complications, morbidity, mortality, among others, and they were willing to proceed.   Sharper Cummins, MD 12/09/2023 10:22 AM

## 2023-12-09 NOTE — Progress Notes (Signed)
 Surgery cancelled due to positive urine drug screen per Dr. Jerri. Pt made aware. IV removed. Pt states he will ride the bus home.

## 2023-12-11 ENCOUNTER — Ambulatory Visit: Admitting: Podiatry

## 2023-12-17 ENCOUNTER — Encounter: Payer: Self-pay | Admitting: Orthopaedic Surgery

## 2023-12-24 ENCOUNTER — Encounter: Admitting: Physician Assistant

## 2023-12-30 ENCOUNTER — Other Ambulatory Visit: Payer: Self-pay

## 2023-12-30 ENCOUNTER — Emergency Department (HOSPITAL_COMMUNITY)
Admission: EM | Admit: 2023-12-30 | Discharge: 2023-12-30 | Disposition: A | Discharge status: Home / Self Care | Attending: Emergency Medicine | Admitting: Emergency Medicine

## 2023-12-30 DIAGNOSIS — M25552 Pain in left hip: Secondary | ICD-10-CM | POA: Insufficient documentation

## 2023-12-30 DIAGNOSIS — I10 Essential (primary) hypertension: Secondary | ICD-10-CM | POA: Insufficient documentation

## 2023-12-30 DIAGNOSIS — F17210 Nicotine dependence, cigarettes, uncomplicated: Secondary | ICD-10-CM | POA: Diagnosis not present

## 2023-12-30 DIAGNOSIS — I2510 Atherosclerotic heart disease of native coronary artery without angina pectoris: Secondary | ICD-10-CM | POA: Diagnosis not present

## 2023-12-30 MED ORDER — NAPROXEN 250 MG PO TABS
500.0000 mg | ORAL_TABLET | Freq: Once | ORAL | Status: AC
  Administered 2023-12-30: 500 mg via ORAL
  Filled 2023-12-30: qty 2

## 2023-12-30 NOTE — ED Triage Notes (Signed)
 Pt here for chronic hip pain.

## 2023-12-30 NOTE — Discharge Instructions (Addendum)
 You were evaluated in the Emergency Department and after careful evaluation, we did not find any emergent condition requiring admission or further testing in the hospital.  Your exam/testing today is overall reassuring.  Continue Tylenol  and Motrin  at home for discomfort.  Please return to the Emergency Department if you experience any worsening of your condition.   Thank you for allowing us  to be a part of your care.

## 2023-12-30 NOTE — ED Notes (Signed)
 Reviewed written d/c insturctions with pt and all questions answered. Pt verbalized understanding. Pt left in stable condition with all belongings.

## 2023-12-30 NOTE — ED Provider Notes (Signed)
 [facility]-EMERGENCY DEPT [facility] Emergency Department Provider Note MRN:  [id number]  Arrival date & time: 12/30/23     Chief Complaint   Hip Pain   History of Present Illness   Kevin Barton is a 63 y.o. year-old male with a history of CAD presenting to the ED with chief complaint of hip pain.  Acute on chronic hip pain worsening with the cold weather recently.  No other complaints.  No fever.  No falls.  Review of Systems  A thorough review of systems was obtained and all systems are negative except as noted in the HPI and PMH.   Patient's Health History    Past Medical History:  Diagnosis Date   Arthritis    CAD (coronary artery disease)    cath 06/2016 occlusion of RCA with collateralization from the left coronary artery. Mild nonobstructive plaque in the left main, LAD, and LCx. LVEF 50%. TX Rx.    Chronic back pain    Chronic knee pain    HTN (hypertension)    Hyperlipidemia    NSTEMI (non-ST elevated myocardial infarction) (HCC)    Stroke (HCC)    Substance abuse (HCC)    Tobacco abuse     Past Surgical History:  Procedure Laterality Date   IR ANGIO INTRA EXTRACRAN SEL COM CAROTID INNOMINATE UNI L MOD SED  10/05/2017   IR ANGIO VERTEBRAL SEL SUBCLAVIAN INNOMINATE UNI R MOD SED  10/05/2017   IR CT HEAD LTD  10/05/2017   IR INTRAVSC STENT CERV CAROTID W/O EMB-PROT MOD SED INC ANGIO  10/05/2017   IR PERCUTANEOUS ART THROMBECTOMY/INFUSION INTRACRANIAL INC DIAG ANGIO  10/05/2017   LEFT HEART CATH AND CORONARY ANGIOGRAPHY N/A 07/25/2016   Procedure: Left Heart Cath and Coronary Angiography;  Surgeon: Wonda Sharper, MD;  Location: [facility] INVASIVE CV LAB;  Service: Cardiovascular;  Laterality: N/A;   RADIOLOGY WITH ANESTHESIA N/A 10/05/2017   Procedure: IR WITH ANESTHESIA CODE STROKE;  Surgeon: Dolphus Carrion, MD;  Location: [facility] OR;  Service: Radiology;  Laterality: N/A;    Family History  Problem Relation Age of Onset   Diabetes Brother     Social History    Socioeconomic History   Marital status: Single    Spouse name: Not on file   Number of children: Not on file   Years of education: Not on file   Highest education level: Not on file  Occupational History   Not on file  Tobacco Use   Smoking status: Every Day    Current packs/day: 0.50    Types: Cigarettes   Smokeless tobacco: Never   Tobacco comments:    smoke 6 per week  Vaping Use   Vaping status: Never Used  Substance and Sexual Activity   Alcohol use: Yes    Comment: a beer or two a day   Drug use: Yes    Types: Marijuana   Sexual activity: Yes  Other Topics Concern   Not on file  Social History Narrative   Not on file   Social Drivers of Health   Financial Resource Strain: High Risk (09/16/2019)   Overall Financial Resource Strain (CARDIA)    Difficulty of Paying Living Expenses: Hard  Food Insecurity: Food Insecurity Present (09/16/2019)   Hunger Vital Sign    Worried About Running Out of Food in the Last Year: Sometimes true    Ran Out of Food in the Last Year: Never true  Transportation Needs: No Transportation Needs (09/16/2019)   PRAPARE - Transportation  Lack of Transportation (Medical): No    Lack of Transportation (Non-Medical): No  Physical Activity: Not on file  Stress: Not on file  Social Connections: Not on file  Intimate Partner Violence: Not on file     Physical Exam   Vitals:   12/30/23 0412  BP: 125/73  Pulse: 67  Resp: 18  Temp: (!) 97.5 F (36.4 C)  SpO2: 91%    CONSTITUTIONAL: Well-appearing, NAD NEURO/PSYCH:  Alert and oriented x 3, no focal deficits EYES:  eyes equal and reactive ENT/NECK:  no LAD, no JVD CARDIO: Regular rate, well-perfused, normal S1 and S2 PULM:  CTAB no wheezing or rhonchi GI/GU:  non-distended, non-tender MSK/SPINE:  No gross deformities, no edema SKIN:  no rash, atraumatic   *Additional and/or pertinent findings included in MDM below  Diagnostic and Interventional Summary    EKG  Interpretation Date/Time:    Ventricular Rate:    PR Interval:    QRS Duration:    QT Interval:    QTC Calculation:   R Axis:      Text Interpretation:         Labs Reviewed - No data to display  No orders to display    Medications  naproxen  (NAPROSYN ) tablet 500 mg (has no administration in time range)     Procedures  /  Critical Care Procedures  ED Course and Medical Decision Making  Initial Impression and Ddx Patient well-appearing in no acute distress, nontoxic, normal vitals.  Able to move the hip, some mild reduced range of motion due to pain.  No trauma to warrant imaging.  Has been here multiple times recently for the same complaint.  Past medical/surgical history that increases complexity of ED encounter: None  Interpretation of Diagnostics Laboratory and/or imaging options to aid in the diagnosis/care of the patient were considered.  After careful history and physical examination, it was determined that there was no indication for diagnostics at this time.  Patient Reassessment and Ultimate Disposition/Management     Discharge  Patient management required discussion with the following services or consulting groups:  None  Complexity of Problems Addressed Acute complicated illness or Injury  Additional Data Reviewed and Analyzed Further history obtained from: Prior ED visit notes  Additional Factors Impacting ED Encounter Risk None  Ozell HERO. Theadore, MD [facility] Emergency Medicine [facility] [email or web address]  Final Clinical Impressions(s) / ED Diagnoses     ICD-10-CM   1. Left hip pain  M25.552       ED Discharge Orders     None        Discharge Instructions Discussed with and Provided to Patient:    Discharge Instructions      You were evaluated in the Emergency Department and after careful evaluation, we did not find any emergent condition requiring admission or further testing in the hospital.  Your  exam/testing today is overall reassuring.  Continue Tylenol  and Motrin  at home for discomfort.  Please return to the Emergency Department if you experience any worsening of your condition.   Thank you for allowing us  to be a part of your care.      Theadore Ozell HERO, MD 12/30/23 [phone]

## 2023-12-31 ENCOUNTER — Other Ambulatory Visit: Payer: Self-pay

## 2023-12-31 ENCOUNTER — Encounter (HOSPITAL_COMMUNITY): Payer: Self-pay

## 2023-12-31 ENCOUNTER — Emergency Department (HOSPITAL_COMMUNITY)
Admission: EM | Admit: 2023-12-31 | Discharge: 2023-12-31 | Discharge status: Against Medical Advice | Attending: Emergency Medicine | Admitting: Emergency Medicine

## 2023-12-31 DIAGNOSIS — M25559 Pain in unspecified hip: Secondary | ICD-10-CM

## 2023-12-31 DIAGNOSIS — Z5321 Procedure and treatment not carried out due to patient leaving prior to being seen by health care provider: Secondary | ICD-10-CM | POA: Diagnosis not present

## 2023-12-31 DIAGNOSIS — M25552 Pain in left hip: Secondary | ICD-10-CM | POA: Diagnosis present

## 2023-12-31 NOTE — ED Triage Notes (Signed)
 Pt arrived via POV c/o left hip pain 10/10 pain

## 2023-12-31 NOTE — ED Provider Notes (Signed)
 Patient eloped prior to being seen by me.   Hildegard Loge, PA-C 12/31/23 1230    Patsey Lot, MD 12/31/23 [phone]

## 2024-01-06 ENCOUNTER — Emergency Department (HOSPITAL_COMMUNITY)
Admission: EM | Admit: 2024-01-06 | Discharge: 2024-01-07 | Disposition: A | Discharge status: Home / Self Care | Attending: Emergency Medicine | Admitting: Emergency Medicine

## 2024-01-06 ENCOUNTER — Other Ambulatory Visit: Payer: Self-pay

## 2024-01-06 DIAGNOSIS — M1612 Unilateral primary osteoarthritis, left hip: Secondary | ICD-10-CM | POA: Insufficient documentation

## 2024-01-06 DIAGNOSIS — M25552 Pain in left hip: Secondary | ICD-10-CM | POA: Diagnosis present

## 2024-01-06 DIAGNOSIS — G8929 Other chronic pain: Secondary | ICD-10-CM

## 2024-01-06 NOTE — ED Provider Triage Note (Signed)
 Emergency Medicine Provider Triage Evaluation Note  Kevin Barton , a 63 y.o. male  was evaluated in triage.  Pt complains of right hip pain. States has issues chronically with his hips. No recent falls or injuries.  Typically takes [employer] back and body however his convenience store was out of this so he has not taken anything.  No chest pain, shortness of breath, swelling, redness, warmth to his extremities.  He is ambulatory.  States pain feels like his chronic hip pain.  Review of Systems  Positive: Hip pain Negative:   Physical Exam  There were no vitals taken for this visit. Gen:   Awake, no distress   Resp:  Normal effort  MSK:   Moves extremities without difficulty, full rom, ambulatory Other:    Medical Decision Making  Medically screening exam initiated at 8:35 PM.  Appropriate orders placed.  Kevin Barton was informed that the remainder of the evaluation will be completed by another provider, this initial triage assessment does not replace that evaluation, and the importance of remaining in the ED until their evaluation is complete.  Right hip pain   Treson Laura A, PA-C 01/06/24 2037

## 2024-01-06 NOTE — ED Triage Notes (Signed)
 Patient reports chronic right hip pain worse this evening , denies injury , ambulatory using cane , states pain recurs with cold weather.

## 2024-01-07 MED ORDER — LIDOCAINE 5 % EX PTCH
1.0000 | MEDICATED_PATCH | CUTANEOUS | Status: DC
  Filled 2024-01-07: qty 1

## 2024-01-07 NOTE — ED Notes (Signed)
 Pt refused lidocaine  patch and got verbally aggressive and cursing at staff. Pt ambulatory out of ER.

## 2024-01-07 NOTE — ED Provider Notes (Signed)
 [place] EMERGENCY DEPARTMENT AT [facility] Provider Note   CSN: [id number] Arrival date & time: 01/06/24  2018     Patient presents with: No chief complaint on file.   Kevin Barton is a 63 y.o. male.   63 yo male with chronic left hip pain. Presents due to pain worse from the cold weather. No changes in chronic pain pattern. No recent injury. States he comes for OxyContin  when it hurts.        Prior to Admission medications   Medication Sig Start Date End Date Taking? Authorizing Provider  docusate sodium  (COLACE) 100 MG capsule Take 1 capsule (100 mg total) by mouth daily as needed. 12/04/23 12/03/24  Jule Ronal CROME, PA-C  methocarbamol  (ROBAXIN ) 750 MG tablet Take 1 tablet (750 mg total) by mouth 3 (three) times daily as needed. 12/04/23   Jule Ronal CROME, PA-C  ondansetron  (ZOFRAN ) 4 MG tablet Take 1 tablet (4 mg total) by mouth every 8 (eight) hours as needed for nausea or vomiting. 12/04/23   Jule Ronal CROME, PA-C  oxyCODONE -acetaminophen  (PERCOCET) 5-325 MG tablet Take 1-2 tablets by mouth every 8 (eight) hours as needed. To be taken after surgery 12/04/23   Jule Ronal CROME, PA-C  amLODipine  (NORVASC ) 5 MG tablet Take 1 tablet (5 mg total) by mouth daily. 11/14/21   Wonda Sharper, MD  amoxicillin -clavulanate (AUGMENTIN ) 875-125 MG tablet Take 1 tablet by mouth every 12 (twelve) hours. 07/28/23   Logan Ubaldo NOVAK, PA-C  atorvastatin  (LIPITOR ) 80 MG tablet Take 1 tablet (80 mg total) by mouth daily at 6 PM. 11/14/21   Wonda Sharper, MD  BRILINTA  90 MG TABS tablet Take 90 mg by mouth in the morning.    [provider]  furosemide  (LASIX ) 20 MG tablet Take 1 tablet (20 mg total) by mouth daily. 08/22/23   Horton, Charmaine FALCON, MD  lidocaine  (LIDODERM ) 5 % Place 1 patch onto the skin daily. Remove & Discard patch within 12 hours or as directed by MD 11/23/23   Randol Simmonds, MD  Vitamin D, Ergocalciferol, (DRISDOL) 1.25 MG (50000 UNIT) CAPS capsule Take 50,000  Units by mouth once a week. 10/01/22   [provider]    Allergies: Patient has no known allergies.    Review of Systems Negative except as per HPI Updated Vital Signs BP (!) 142/100   Pulse 65   Temp 97.9 F (36.6 C) (Oral)   Resp (!) 22   SpO2 100%   Physical Exam Vitals and nursing note reviewed.  Constitutional:      General: He is not in acute distress.    Appearance: He is well-developed. He is not diaphoretic.  HENT:     Head: Normocephalic and atraumatic.  Cardiovascular:     Pulses: Normal pulses.  Pulmonary:     Effort: Pulmonary effort is normal.  Musculoskeletal:        General: Tenderness present. No deformity or signs of injury.     Right lower leg: Edema present.     Left lower leg: Edema present.  Skin:    General: Skin is warm and dry.     Findings: No erythema or rash.  Neurological:     Mental Status: He is alert and oriented to person, place, and time.     Sensory: No sensory deficit.  Psychiatric:        Behavior: Behavior normal.     (all labs ordered are listed, but only abnormal results are displayed) Labs Reviewed -  No data to display  EKG: None  Radiology: No results found.   Procedures   Medications Ordered in the ED  lidocaine  (LIDODERM ) 5 % 1 patch (has no administration in time range)                                    Medical Decision Making Risk Prescription drug management.   63 year old male returns to the emergency room with recurrent chronic left hip pain, known osteoarthritis of the hip as reviewed on prior x-ray last obtained on December 02, 2023.  No changes to his chronic pain pattern.  He states the pain is worse with the cold weather.  He is provided with Lidoderm  patch and referral to orthopedics for further evaluation.     Final diagnoses:  Chronic left hip pain  Osteoarthritis of left hip, unspecified osteoarthritis type    ED Discharge Orders     None          Beverley Leita LABOR,  PA-C 01/07/24 0152    Melvenia Motto, MD 01/07/24 [phone]

## 2024-01-07 NOTE — Discharge Instructions (Signed)
Follow up with orthopedics, call to schedule an appointment.

## 2024-01-08 ENCOUNTER — Emergency Department (HOSPITAL_COMMUNITY)
Admission: EM | Admit: 2024-01-08 | Discharge: 2024-01-08 | Disposition: A | Discharge status: Home / Self Care | Attending: Emergency Medicine | Admitting: Emergency Medicine

## 2024-01-08 ENCOUNTER — Other Ambulatory Visit: Payer: Self-pay

## 2024-01-08 DIAGNOSIS — M25552 Pain in left hip: Secondary | ICD-10-CM | POA: Diagnosis not present

## 2024-01-08 DIAGNOSIS — G8929 Other chronic pain: Secondary | ICD-10-CM | POA: Insufficient documentation

## 2024-01-08 DIAGNOSIS — Z79899 Other long term (current) drug therapy: Secondary | ICD-10-CM | POA: Insufficient documentation

## 2024-01-08 MED ORDER — ACETAMINOPHEN 500 MG PO TABS
1000.0000 mg | ORAL_TABLET | Freq: Once | ORAL | Status: AC
  Administered 2024-01-08: 1000 mg via ORAL
  Filled 2024-01-08: qty 2

## 2024-01-08 MED ORDER — PREDNISONE 20 MG PO TABS
60.0000 mg | ORAL_TABLET | Freq: Once | ORAL | Status: AC
  Administered 2024-01-08: 60 mg via ORAL
  Filled 2024-01-08: qty 3

## 2024-01-08 NOTE — Assessment & Plan Note (Signed)
 He has a known occlusion of the RCA with no sig CAD elsewhere by cath in 2018. Myoview  in 05/2021 was low risk and neg for ischemia. ***

## 2024-01-08 NOTE — Progress Notes (Unsigned)
 OFFICE NOTE:    Date:  01/10/2024  ID:  Kevin Barton, DOB 02/26/1961, MRN [id number] PCP: Austin Mutton, MD  [facility] Providers Cardiologist:  Ozell Fell, MD Cardiology APP:  Lelon Glendia DASEN, PA-C        Coronary artery disease  Known CTO of the RCA w/ L-R collats >> med Rx Cath 07/25/16: oLCx 30, mRCA 100 w L-R collat, EF 45-50  TTE 10/06/17: Moderate LVH, EF 65-70, normal wall motion, grade 2 diastolic dysfunction, mild MAC, trivial MR, normal RVSF  Myoview  06/13/21: EF 57, no ischemia, low risk  Hx of CVA (R MCA) in 8/19 >> tx w/ tPA, endovascular intervention/ICA stent Carotid US  02/12/23: RICA 1-39, LICA 40-59 (followed by Int Radiology) Polysubstance abuse Hypertension  Hyperlipidemia  Tobacco abuse         Discussed the use of AI scribe software for clinical note transcription with the patient, who gave verbal consent to proceed. History of Present Illness Kevin Barton is a 63 y.o. male for follow up of CAD. Last seen in 05/2022. He has been scheduled for R knee replacement several times. These have been canceled due to positive UDS for cocaine. He has been seen in the ED multiple times for hip pain.  He is here alone. Other than his knee, he is feeling fine. No current chest pain, shortness of breath, or syncope.  He reports swelling in his right leg for about two months. An ultrasound in June revealed a Baker's cyst in the L popliteal space. He has been prescribed diuretics but has not yet picked them up. He has not taken any medications for approximately eight months, including amlodipine  for hypertension, Lipitor  for hyperlipidemia. He wants to restart these medications, particularly amlodipine  and Lipitor . He has a history of polysubstance abuse, including cocaine, and his right knee replacement has been canceled several times due to positive urine screens for cocaine. He denies current cocaine use but admits to marijuana use, attributing it to secondhand  exposure. He lives in a motel and has been there for about four months after losing his previous housing. 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 He smokes about one cigarette a day and does not buy packs anymore.  ROS-See HPI    Studies Reviewed:  EKG Interpretation Date/Time:  Friday January 10 2024 08:47:52 EST Ventricular Rate:  68 PR Interval:  200 QRS Duration:  84 QT Interval:  376 QTC Calculation: 399 R Axis:   52  Text Interpretation: Normal sinus rhythm Nonspecific ST and T wave abnormality Confirmed by Lelon Glendia [phone]) on 01/10/2024 9:16:46 AM    Labs 06/05/22: TC 212, HDL 68, LDL 127, Trig 97 12/09/23: K 4.8, SCr 1.00         Physical Exam:  VS:  BP 130/68   Pulse 66   Ht 5' 4 (1.626 m)   Wt 168 lb (76.2 kg)   SpO2 98%   BMI 28.84 kg/m        Wt Readings from Last 3 Encounters:  01/10/24 168 lb (76.2 kg)  01/09/24 175 lb 14.8 oz (79.8 kg)  12/31/23 175 lb 14.8 oz (79.8 kg)    Constitutional:      Appearance: Healthy appearance. Not in distress.  Pulmonary:     Breath sounds: No wheezing. No rales.  Cardiovascular:     Normal rate. Regular rhythm.     Murmurs: There is no murmur.  Edema:    Peripheral  edema present.    Pretibial: trace edema of the left pretibial area and 1+ edema of the right pretibial area. Skin:    General: Skin is warm and dry.       Assessment and Plan:    Assessment & Plan Coronary artery disease involving native coronary artery of native heart without angina pectoris He has a known occlusion of the RCA with no sig CAD elsewhere by cath in 2018. Myoview  in 05/2021 was low risk and neg for ischemia. No current chest pain or symptoms suggestive of angina. - Restart Lipitor  80 mg daily - Restart aspirin  81 mg daily - Will follow up in one year Essential (primary) hypertension Blood pressure reasonably well controlled today. Has been off amlodipine  for quite some time. - Restart amlodipine  5  mg daily - Will obtain CMET in three months Hyperlipidemia LDL goal <70 LDL goal is less than 70, ideally less than 55. Has been off Lipitor  for quite some time. - Restart Lipitor  80 mg daily - Will arrange CMET, fasting Lipids in three months History of CVA (cerebrovascular accident) without residual deficits Right MCA stroke in August 2019 treated with TPA and endovascular stenting. Right ICA stent patent as of 01/2023.  He is followed by interventional radiology. Has not taken Brilinta  in years. Polysubstance abuse (HCC) Polysubstance abuse with cocaine and marijuana. Right knee replacement surgery has been canceled and a couple of occasions due to positive urine screen for cocaine. Reports not using cocaine for a while but continues to use marijuana.  I advised him to avoid cocaine use        Dispo:  Return in about 1 year (around 01/09/2025) for Routine Follow Up, w/ Dr. Wonda, or Glendia Ferrier, PA-C.  Signed, Glendia Ferrier, PA-C

## 2024-01-08 NOTE — ED Triage Notes (Signed)
 Chronic left hip pain , denies injury/ambulatory . Seen here yesterday for the same complaint .

## 2024-01-08 NOTE — ED Provider Notes (Signed)
  EMERGENCY DEPARTMENT AT [facility] Provider Note   CSN: [id number] Arrival date & time: 01/08/24  9461     Patient presents with: Left Hip Pain  (Chronic)   Kevin Barton is a 63 y.o. male.   Patient presents to the emergency department for evaluation of hip pain.  Patient reports that he usually comes in here and he gets Oxy.       Prior to Admission medications   Medication Sig Start Date End Date Taking? Authorizing Provider  docusate sodium  (COLACE) 100 MG capsule Take 1 capsule (100 mg total) by mouth daily as needed. 12/04/23 12/03/24  Jule Ronal CROME, PA-C  methocarbamol  (ROBAXIN ) 750 MG tablet Take 1 tablet (750 mg total) by mouth 3 (three) times daily as needed. 12/04/23   Jule Ronal CROME, PA-C  ondansetron  (ZOFRAN ) 4 MG tablet Take 1 tablet (4 mg total) by mouth every 8 (eight) hours as needed for nausea or vomiting. 12/04/23   Jule Ronal CROME, PA-C  oxyCODONE -acetaminophen  (PERCOCET) 5-325 MG tablet Take 1-2 tablets by mouth every 8 (eight) hours as needed. To be taken after surgery 12/04/23   Jule Ronal CROME, PA-C  amLODipine  (NORVASC ) 5 MG tablet Take 1 tablet (5 mg total) by mouth daily. 11/14/21   Wonda Sharper, MD  atorvastatin  (LIPITOR ) 80 MG tablet Take 1 tablet (80 mg total) by mouth daily at 6 PM. 11/14/21   Wonda Sharper, MD  BRILINTA  90 MG TABS tablet Take 90 mg by mouth in the morning.    [provider]  furosemide  (LASIX ) 20 MG tablet Take 1 tablet (20 mg total) by mouth daily. 08/22/23   Horton, Charmaine FALCON, MD  lidocaine  (LIDODERM ) 5 % Place 1 patch onto the skin daily. Remove & Discard patch within 12 hours or as directed by MD 11/23/23   Randol Simmonds, MD  Vitamin D, Ergocalciferol, (DRISDOL) 1.25 MG (50000 UNIT) CAPS capsule Take 50,000 Units by mouth once a week. 10/01/22   [provider]    Allergies: Patient has no known allergies.    Review of Systems  Updated Vital Signs BP 111/81 (BP Location: Right Arm)    Pulse 63   Temp 97.8 F (36.6 C)   Resp 18   SpO2 96%   Physical Exam Vitals and nursing note reviewed.  Constitutional:      General: He is not in acute distress.    Appearance: He is well-developed.  HENT:     Head: Normocephalic and atraumatic.     Mouth/Throat:     Mouth: Mucous membranes are moist.  Eyes:     General: Vision grossly intact. Gaze aligned appropriately.     Extraocular Movements: Extraocular movements intact.     Conjunctiva/sclera: Conjunctivae normal.  Cardiovascular:     Rate and Rhythm: Normal rate and regular rhythm.     Pulses: Normal pulses.     Heart sounds: Normal heart sounds, S1 normal and S2 normal. No murmur heard.    No friction rub. No gallop.  Pulmonary:     Effort: Pulmonary effort is normal. No respiratory distress.     Breath sounds: Normal breath sounds.  Abdominal:     Palpations: Abdomen is soft.     Tenderness: There is no abdominal tenderness. There is no guarding or rebound.     Hernia: No hernia is present.  Musculoskeletal:        General: No swelling.     Cervical back: Full passive range of motion without pain,  normal range of motion and neck supple. No pain with movement, spinous process tenderness or muscular tenderness. Normal range of motion.     Right hip: Decreased range of motion.     Left hip: Decreased range of motion.     Right lower leg: No edema.     Left lower leg: No edema.  Skin:    General: Skin is warm and dry.     Capillary Refill: Capillary refill takes less than 2 seconds.     Findings: No ecchymosis, erythema, lesion or wound.  Neurological:     Mental Status: He is alert and oriented to person, place, and time.     GCS: GCS eye subscore is 4. GCS verbal subscore is 5. GCS motor subscore is 6.     Cranial Nerves: Cranial nerves 2-12 are intact.     Sensory: Sensation is intact.     Motor: Motor function is intact. No weakness or abnormal muscle tone.     Coordination: Coordination is intact.   Psychiatric:        Mood and Affect: Mood normal.        Speech: Speech normal.        Behavior: Behavior normal.     (all labs ordered are listed, but only abnormal results are displayed) Labs Reviewed - No data to display  EKG: None  Radiology: No results found.   Procedures   Medications Ordered in the ED - No data to display                                  Medical Decision Making  Patient presents with chronic hip pain.  Records indicate that he is followed by Dr. Jerri, orthopedics.  Patient pending right total knee arthroplasty.  He has been seen multiple times in the emergency department for hip pain, has severe arthritic changes in the left hip.  Patient counseled that we do not repeatedly treat chronic pain in the ED.  He is unhappy that he is not getting narcotics.     Final diagnoses:  Chronic left hip pain    ED Discharge Orders     None          Haze Lonni PARAS, MD 01/08/24 [phone]

## 2024-01-09 ENCOUNTER — Emergency Department (HOSPITAL_COMMUNITY)

## 2024-01-09 ENCOUNTER — Emergency Department (HOSPITAL_COMMUNITY)
Admission: EM | Admit: 2024-01-09 | Discharge: 2024-01-10 | Disposition: A | Discharge status: Home / Self Care | Attending: Emergency Medicine | Admitting: Emergency Medicine

## 2024-01-09 ENCOUNTER — Other Ambulatory Visit: Payer: Self-pay

## 2024-01-09 ENCOUNTER — Encounter (HOSPITAL_COMMUNITY): Payer: Self-pay

## 2024-01-09 DIAGNOSIS — I10 Essential (primary) hypertension: Secondary | ICD-10-CM | POA: Insufficient documentation

## 2024-01-09 DIAGNOSIS — M25552 Pain in left hip: Secondary | ICD-10-CM | POA: Insufficient documentation

## 2024-01-09 DIAGNOSIS — Z8673 Personal history of transient ischemic attack (TIA), and cerebral infarction without residual deficits: Secondary | ICD-10-CM | POA: Insufficient documentation

## 2024-01-09 DIAGNOSIS — M1612 Unilateral primary osteoarthritis, left hip: Secondary | ICD-10-CM | POA: Diagnosis not present

## 2024-01-09 DIAGNOSIS — Z79899 Other long term (current) drug therapy: Secondary | ICD-10-CM | POA: Diagnosis not present

## 2024-01-09 NOTE — ED Triage Notes (Signed)
 Pt coming in reporting worsening pain in his left hip.

## 2024-01-10 ENCOUNTER — Ambulatory Visit: Admitting: Physician Assistant

## 2024-01-10 ENCOUNTER — Encounter: Payer: Self-pay | Admitting: Physician Assistant

## 2024-01-10 ENCOUNTER — Ambulatory Visit: Attending: Physician Assistant | Admitting: Physician Assistant

## 2024-01-10 VITALS — BP 130/68 | HR 66 | Ht 64.0 in | Wt 168.0 lb

## 2024-01-10 DIAGNOSIS — Z8673 Personal history of transient ischemic attack (TIA), and cerebral infarction without residual deficits: Secondary | ICD-10-CM | POA: Diagnosis present

## 2024-01-10 DIAGNOSIS — I2510 Atherosclerotic heart disease of native coronary artery without angina pectoris: Secondary | ICD-10-CM | POA: Insufficient documentation

## 2024-01-10 DIAGNOSIS — F1910 Other psychoactive substance abuse, uncomplicated: Secondary | ICD-10-CM | POA: Insufficient documentation

## 2024-01-10 DIAGNOSIS — I10 Essential (primary) hypertension: Secondary | ICD-10-CM | POA: Diagnosis present

## 2024-01-10 DIAGNOSIS — E785 Hyperlipidemia, unspecified: Secondary | ICD-10-CM | POA: Diagnosis present

## 2024-01-10 MED ORDER — AMLODIPINE BESYLATE 5 MG PO TABS: 5.0000 mg | ORAL_TABLET | Freq: Every day | ORAL | 3 refills | Status: AC

## 2024-01-10 MED ORDER — ASPIRIN EC 81 MG PO TBEC: 81.0000 mg | DELAYED_RELEASE_TABLET | Freq: Every day | ORAL | Status: AC

## 2024-01-10 MED ORDER — FUROSEMIDE 20 MG PO TABS: 20.0000 mg | ORAL_TABLET | Freq: Every day | ORAL | 1 refills | Status: AC | PRN

## 2024-01-10 MED ORDER — NAPROXEN 250 MG PO TABS
500.0000 mg | ORAL_TABLET | Freq: Once | ORAL | Status: AC
  Administered 2024-01-10: 500 mg via ORAL
  Filled 2024-01-10: qty 2

## 2024-01-10 MED ORDER — ATORVASTATIN CALCIUM 80 MG PO TABS: 80.0000 mg | ORAL_TABLET | Freq: Every day | ORAL | 3 refills | Status: AC

## 2024-01-10 NOTE — Assessment & Plan Note (Signed)
 Blood pressure reasonably well controlled today. Has been off amlodipine  for quite some time. - Restart amlodipine  5 mg daily - Will obtain CMET in three months

## 2024-01-10 NOTE — Discharge Instructions (Signed)
 Follow-up with your orthopedist. Return here for new concerns.

## 2024-01-10 NOTE — Assessment & Plan Note (Signed)
 Polysubstance abuse with cocaine and marijuana. Right knee replacement surgery has been canceled and a couple of occasions due to positive urine screen for cocaine. Reports not using cocaine for a while but continues to use marijuana.  I advised him to avoid cocaine use

## 2024-01-10 NOTE — Assessment & Plan Note (Signed)
 Right MCA stroke in August 2019 treated with TPA and endovascular stenting. Right ICA stent patent as of 01/2023.  He is followed by interventional radiology. Has not taken Brilinta  in years.

## 2024-01-10 NOTE — Patient Instructions (Signed)
 Medication Instructions:  START taking Aspirin  81 mg, take one by mouth daily. *If you need a refill on your cardiac medications before your next appointment, please call your pharmacy*  Lab Work: CMET and Fasting Lipid Panel in 3 months If you have labs (blood work) drawn today and your tests are completely normal, you will receive your results only by: MyChart Message (if you have MyChart) OR A paper copy in the mail If you have any lab test that is abnormal or we need to change your treatment, we will call you to review the results.  Testing/Procedures: None ordered  Follow-Up: At [facility], you and your health needs are our priority.  As part of our continuing mission to provide you with exceptional heart care, our providers are all part of one team.  This team includes your primary Cardiologist (physician) and Advanced Practice Providers or APPs (Physician Assistants and Nurse Practitioners) who all work together to provide you with the care you need, when you need it.  Your next appointment:   1 year(s)  Provider:   Ozell Fell, MD or Eddy Ferrier, PA-C   We recommend signing up for the patient portal called MyChart.  Sign up information is provided on this After Visit Summary.  MyChart is used to connect with patients for Virtual Visits (Telemedicine).  Patients are able to view lab/test results, encounter notes, upcoming appointments, etc.  Non-urgent messages can be sent to your provider as well.   To learn more about what you can do with MyChart, go to [email or web address].

## 2024-01-10 NOTE — Assessment & Plan Note (Signed)
 LDL goal is less than 70, ideally less than 55. Has been off Lipitor  for quite some time. - Restart Lipitor  80 mg daily - Will arrange CMET, fasting Lipids in three months

## 2024-01-10 NOTE — ED Provider Notes (Signed)
 [place] EMERGENCY DEPARTMENT AT [facility] Provider Note   CSN: [id number] Arrival date & time: 01/09/24  2248     Patient presents with: Hip Pain   Sergi Yohn is a 63 y.o. male.   The history is provided by the patient and medical records.  Hip Pain   63 year old male with history of arthritis, chronic pain, hypertension, substance abuse, prior stroke, presenting to the ED for left hip pain.  Has known advanced arthritis of this hip, feels like it has been worse recently because of the weather.  States cold weather tends to flareup his arthritis.  Denies any falls or trauma.  He is followed by orthopedics, Dr. Jerri and is actually due to knee replacement soon.  Prior to Admission medications   Medication Sig Start Date End Date Taking? Authorizing Provider  docusate sodium  (COLACE) 100 MG capsule Take 1 capsule (100 mg total) by mouth daily as needed. 12/04/23 12/03/24  Jule Ronal CROME, PA-C  methocarbamol  (ROBAXIN ) 750 MG tablet Take 1 tablet (750 mg total) by mouth 3 (three) times daily as needed. 12/04/23   Jule Ronal CROME, PA-C  ondansetron  (ZOFRAN ) 4 MG tablet Take 1 tablet (4 mg total) by mouth every 8 (eight) hours as needed for nausea or vomiting. 12/04/23   Jule Ronal CROME, PA-C  oxyCODONE -acetaminophen  (PERCOCET) 5-325 MG tablet Take 1-2 tablets by mouth every 8 (eight) hours as needed. To be taken after surgery 12/04/23   Jule Ronal CROME, PA-C  amLODipine  (NORVASC ) 5 MG tablet Take 1 tablet (5 mg total) by mouth daily. 11/14/21   Wonda Sharper, MD  atorvastatin  (LIPITOR ) 80 MG tablet Take 1 tablet (80 mg total) by mouth daily at 6 PM. 11/14/21   Wonda Sharper, MD  BRILINTA  90 MG TABS tablet Take 90 mg by mouth in the morning.    [provider]  furosemide  (LASIX ) 20 MG tablet Take 1 tablet (20 mg total) by mouth daily. 08/22/23   Horton, Charmaine FALCON, MD  lidocaine  (LIDODERM ) 5 % Place 1 patch onto the skin daily. Remove & Discard patch within 12  hours or as directed by MD 11/23/23   Randol Simmonds, MD  Vitamin D, Ergocalciferol, (DRISDOL) 1.25 MG (50000 UNIT) CAPS capsule Take 50,000 Units by mouth once a week. 10/01/22   [provider]    Allergies: Patient has no known allergies.    Review of Systems  Musculoskeletal:  Positive for arthralgias.  All other systems reviewed and are negative.   Updated Vital Signs BP 128/74   Pulse 84   Temp 97.7 F (36.5 C)   Resp 16   Ht 5' 4 (1.626 m)   Wt 79.8 kg   SpO2 100%   BMI 30.20 kg/m   Physical Exam Vitals and nursing note reviewed.  Constitutional:      Appearance: He is well-developed.     Comments: Sleeping soundly with blanket over head, NAD, awoken for exam  HENT:     Head: Normocephalic and atraumatic.  Eyes:     Conjunctiva/sclera: Conjunctivae normal.     Pupils: Pupils are equal, round, and reactive to light.  Cardiovascular:     Rate and Rhythm: Normal rate and regular rhythm.     Heart sounds: Normal heart sounds.  Pulmonary:     Effort: Pulmonary effort is normal.     Breath sounds: Normal breath sounds.  Musculoskeletal:        General: Normal range of motion.     Cervical back:  Normal range of motion.     Comments: Left hip without any acute deformity, no leg shortening, remains ambulatory in ED  Skin:    General: Skin is warm and dry.  Neurological:     Mental Status: He is oriented to person, place, and time.     (all labs ordered are listed, but only abnormal results are displayed) Labs Reviewed - No data to display  EKG: None  Radiology: DG Hip Unilat With Pelvis 2-3 Views Left Result Date: 01/09/2024 EXAM: 2 OR MORE VIEW(S) XRAY OF THE BILATERAL HIP 01/09/2024 11:25:00 PM COMPARISON: 12/02/2023 CLINICAL HISTORY: left hip pain FINDINGS: BONES AND JOINTS: No acute fracture or focal osseous lesion. Severe degenerative changes of the left hip with bone-on-bone apposition, subcortical cyst formation, subcortical sclerosis, and  osteophytosis, stable since prior exam. Mild degenerative changes of the right hip. SOFT TISSUES: Vascular calcifications. IMPRESSION: 1. Severe left hip osteoarthritis, stable since prior exam. Electronically signed by: Oneil Devonshire MD 01/09/2024 11:31 PM EST RP Workstation: [id number]     Procedures   Medications Ordered in the ED  naproxen  (NAPROSYN ) tablet 500 mg (has no administration in time range)                                    Medical Decision Making Amount and/or Complexity of Data Reviewed Radiology: ordered and independent interpretation performed.  Risk Prescription drug management.   63 year old male presenting to the ED with left hip pain.  Multiple prior visits to ED for same.  Has known advanced arthritis.  X-ray today is unchanged.  He has no acute deformities on exam and remains ambulatory.  Given anti-inflammatory here.  Recommended to follow-up closely with his orthopedist, Dr. Jerri.  Can return here for new concerns.  Final diagnoses:  Left hip pain    ED Discharge Orders     None          Jarold Olam HERO, PA-C 01/10/24 0503    Trine Raynell Moder, MD 01/10/24 [phone]

## 2024-01-15 ENCOUNTER — Emergency Department (HOSPITAL_COMMUNITY)
Admission: EM | Admit: 2024-01-15 | Discharge: 2024-01-15 | Disposition: A | Discharge status: Home / Self Care | Attending: Emergency Medicine | Admitting: Emergency Medicine

## 2024-01-15 ENCOUNTER — Encounter (HOSPITAL_COMMUNITY): Payer: Self-pay | Admitting: Emergency Medicine

## 2024-01-15 ENCOUNTER — Other Ambulatory Visit: Payer: Self-pay

## 2024-01-15 DIAGNOSIS — Z7982 Long term (current) use of aspirin: Secondary | ICD-10-CM | POA: Insufficient documentation

## 2024-01-15 DIAGNOSIS — M25559 Pain in unspecified hip: Secondary | ICD-10-CM | POA: Insufficient documentation

## 2024-01-15 DIAGNOSIS — G8929 Other chronic pain: Secondary | ICD-10-CM | POA: Insufficient documentation

## 2024-01-15 DIAGNOSIS — M25552 Pain in left hip: Secondary | ICD-10-CM | POA: Diagnosis not present

## 2024-01-15 MED ORDER — ACETAMINOPHEN 325 MG PO TABS
650.0000 mg | ORAL_TABLET | Freq: Once | ORAL | Status: AC
  Administered 2024-01-15: 650 mg via ORAL
  Filled 2024-01-15: qty 2

## 2024-01-15 NOTE — Discharge Instructions (Addendum)
 Return if any problems.

## 2024-01-15 NOTE — ED Provider Notes (Signed)
 [place] EMERGENCY DEPARTMENT AT [facility] Provider Note   CSN: [id number] Arrival date & time: 01/15/24  9781     Patient presents with: No chief complaint on file.   Kevin Barton is a 63 y.o. male.   Patient complains of hip pain.  Patient reports that this is the same pain that he has had.  Patient reports he is waiting for surgery.  Patient states that he thinks the hip was aggravated by the cold last p.m.  Patient denies any new injuries he has not had any fever or chills.  Patient denies any abdominal pain no nausea or vomiting  The history is provided by the patient. No language interpreter was used.       Prior to Admission medications   Medication Sig Start Date End Date Taking? Authorizing Provider  amLODipine  (NORVASC ) 5 MG tablet Take 1 tablet (5 mg total) by mouth daily. 01/10/24   Lelon Hamilton T, PA-C  aspirin  EC 81 MG tablet Take 1 tablet (81 mg total) by mouth daily. Swallow whole. 01/10/24   Lelon Hamilton T, PA-C  atorvastatin  (LIPITOR ) 80 MG tablet Take 1 tablet (80 mg total) by mouth daily at 6 PM. 01/10/24   Weaver, Hamilton T, PA-C  docusate sodium  (COLACE) 100 MG capsule Take 1 capsule (100 mg total) by mouth daily as needed. Patient not taking: Reported on 01/10/2024 12/04/23 12/03/24  Jule Ronal CROME, PA-C  furosemide  (LASIX ) 20 MG tablet Take 1 tablet (20 mg total) by mouth daily as needed for edema. 01/10/24   Lelon Hamilton T, PA-C  lidocaine  (LIDODERM ) 5 % Place 1 patch onto the skin daily. Remove & Discard patch within 12 hours or as directed by MD Patient not taking: Reported on 01/10/2024 11/23/23   Randol Simmonds, MD  methocarbamol  (ROBAXIN ) 750 MG tablet Take 1 tablet (750 mg total) by mouth 3 (three) times daily as needed. Patient not taking: Reported on 01/10/2024 12/04/23   Jule Ronal CROME, PA-C  ondansetron  (ZOFRAN ) 4 MG tablet Take 1 tablet (4 mg total) by mouth every 8 (eight) hours as needed for nausea or vomiting. Patient not taking:  Reported on 01/10/2024 12/04/23   Jule Ronal CROME, PA-C  oxyCODONE -acetaminophen  (PERCOCET) 5-325 MG tablet Take 1-2 tablets by mouth every 8 (eight) hours as needed. To be taken after surgery Patient not taking: Reported on 01/10/2024 12/04/23   Jule Ronal CROME, PA-C  Vitamin D, Ergocalciferol, (DRISDOL) 1.25 MG (50000 UNIT) CAPS capsule Take 50,000 Units by mouth once a week. Patient not taking: Reported on 01/10/2024 10/01/22   [provider]    Allergies: Patient has no known allergies.    Review of Systems  All other systems reviewed and are negative.   Updated Vital Signs BP 138/79 (BP Location: Left Arm)   Pulse 69   Temp 97.9 F (36.6 C)   Resp 18   Ht 5' 4 (1.626 m)   Wt 76.2 kg   SpO2 96%   BMI 28.84 kg/m   Physical Exam Constitutional:      Appearance: Normal appearance.  HENT:     Head: Normocephalic.  Cardiovascular:     Rate and Rhythm: Normal rate.  Pulmonary:     Effort: Pulmonary effort is normal.  Musculoskeletal:        General: Tenderness present. Normal range of motion.  Skin:    General: Skin is warm.  Neurological:     General: No focal deficit present.     Mental Status: He  is alert.     (all labs ordered are listed, but only abnormal results are displayed) Labs Reviewed - No data to display  EKG: None  Radiology: No results found.   Procedures   Medications Ordered in the ED  acetaminophen  (TYLENOL ) tablet 650 mg (650 mg Oral Given 01/15/24 0908)                                    Medical Decision Making Pt complains of persistant hip pain  Risk OTC drugs. Risk Details: Pt given tylenol    Patient given 2 Tylenol  for pain.  He has been here for several hours patient is given a sandwich and soda.  He is advised to keep follow-up appointments with his orthopedic physician.  Patient is discharged in stable condition     Final diagnoses:  Chronic hip pain, unspecified laterality    ED Discharge Orders      None       An After Visit Summary was printed and given to the patient.    Flint Sonny POUR, PA-C 01/15/24 1025    Rogelia Jerilynn RAMAN, MD 01/16/24 [phone]

## 2024-01-15 NOTE — ED Triage Notes (Addendum)
 BIB [facility] from homeless with c/o chronic left sided hip pain. Arthritis in hip.  BP 140/88 HR 76 Spo2 100

## 2024-01-22 ENCOUNTER — Telehealth: Payer: Self-pay | Admitting: Orthopaedic Surgery

## 2024-01-22 NOTE — Telephone Encounter (Signed)
 RETURNED MAIL-DISCHARGE LETTER MAILED CERTIFIED RETURNED UNCLAIMED

## 2024-01-30 ENCOUNTER — Emergency Department (HOSPITAL_COMMUNITY)
Admission: EM | Admit: 2024-01-30 | Discharge: 2024-01-31 | Disposition: A | Attending: Emergency Medicine | Admitting: Emergency Medicine

## 2024-01-30 ENCOUNTER — Other Ambulatory Visit: Payer: Self-pay

## 2024-01-30 ENCOUNTER — Encounter (HOSPITAL_COMMUNITY): Payer: Self-pay | Admitting: Emergency Medicine

## 2024-01-30 DIAGNOSIS — M25552 Pain in left hip: Secondary | ICD-10-CM | POA: Insufficient documentation

## 2024-01-30 DIAGNOSIS — Z7982 Long term (current) use of aspirin: Secondary | ICD-10-CM | POA: Insufficient documentation

## 2024-01-30 DIAGNOSIS — M25559 Pain in unspecified hip: Secondary | ICD-10-CM

## 2024-01-30 DIAGNOSIS — M25551 Pain in right hip: Secondary | ICD-10-CM | POA: Diagnosis not present

## 2024-01-30 NOTE — ED Triage Notes (Signed)
 Pt c/o left hip pain, states that he is supposed to have surgery. Hx of arthritis, states the pain is the same.

## 2024-01-30 NOTE — ED Notes (Signed)
 Called pt three times for vitals check...SABRAno response.SABRASABRA

## 2024-01-31 MED ORDER — NAPROXEN 250 MG PO TABS
500.0000 mg | ORAL_TABLET | Freq: Once | ORAL | Status: AC
  Administered 2024-01-31: 500 mg via ORAL
  Filled 2024-01-31: qty 2

## 2024-01-31 NOTE — ED Provider Notes (Signed)
 [place] EMERGENCY DEPARTMENT AT [facility] Provider Note   CSN: [id number] Arrival date & time: 01/30/24  1948     Patient presents with: Hip Pain   Kevin Barton is a 63 y.o. male.   63 year old male presents ER today secondary to acute exacerbation of his chronic pains.  Patient states he has chronic pain in his right knee and left hip.  States this has been worse recently.  Takes Tylenol  at home without much relief.  States he always gets Tylenol  here and it does not help.  No recent fevers or illnesses.  No recent trauma.  Of note, this was patient's 26th visit in 6 months for mostly musculoskeletal pains.   Hip Pain       Prior to Admission medications   Medication Sig Start Date End Date Taking? Authorizing Provider  amLODipine  (NORVASC ) 5 MG tablet Take 1 tablet (5 mg total) by mouth daily. 01/10/24   Lelon Hamilton T, PA-C  aspirin  EC 81 MG tablet Take 1 tablet (81 mg total) by mouth daily. Swallow whole. 01/10/24   Lelon Hamilton T, PA-C  atorvastatin  (LIPITOR ) 80 MG tablet Take 1 tablet (80 mg total) by mouth daily at 6 PM. 01/10/24   Weaver, Hamilton T, PA-C  docusate sodium  (COLACE) 100 MG capsule Take 1 capsule (100 mg total) by mouth daily as needed. Patient not taking: Reported on 01/10/2024 12/04/23 12/03/24  Jule Ronal CROME, PA-C  furosemide  (LASIX ) 20 MG tablet Take 1 tablet (20 mg total) by mouth daily as needed for edema. 01/10/24   Lelon Hamilton T, PA-C  lidocaine  (LIDODERM ) 5 % Place 1 patch onto the skin daily. Remove & Discard patch within 12 hours or as directed by MD Patient not taking: Reported on 01/10/2024 11/23/23   Randol Simmonds, MD  methocarbamol  (ROBAXIN ) 750 MG tablet Take 1 tablet (750 mg total) by mouth 3 (three) times daily as needed. Patient not taking: Reported on 01/10/2024 12/04/23   Jule Ronal CROME, PA-C  ondansetron  (ZOFRAN ) 4 MG tablet Take 1 tablet (4 mg total) by mouth every 8 (eight) hours as needed for nausea or vomiting. Patient  not taking: Reported on 01/10/2024 12/04/23   Jule Ronal CROME, PA-C  oxyCODONE -acetaminophen  (PERCOCET) 5-325 MG tablet Take 1-2 tablets by mouth every 8 (eight) hours as needed. To be taken after surgery Patient not taking: Reported on 01/10/2024 12/04/23   Jule Ronal CROME, PA-C  Vitamin D, Ergocalciferol, (DRISDOL) 1.25 MG (50000 UNIT) CAPS capsule Take 50,000 Units by mouth once a week. Patient not taking: Reported on 01/10/2024 10/01/22   [provider]    Allergies: Patient has no known allergies.    Review of Systems  Updated Vital Signs BP (!) 142/61 (BP Location: Right Arm)   Pulse 68   Temp 98 F (36.7 C)   Resp 16   Ht 5' 4 (1.626 m)   Wt 77 kg   SpO2 98%   BMI 29.14 kg/m   Physical Exam Vitals and nursing note reviewed.  Constitutional:      Appearance: He is well-developed.  HENT:     Head: Normocephalic and atraumatic.  Cardiovascular:     Rate and Rhythm: Normal rate.  Pulmonary:     Effort: Pulmonary effort is normal. No respiratory distress.  Abdominal:     General: There is no distension.  Musculoskeletal:        General: No tenderness. Normal range of motion.     Cervical back: Normal range of motion.  Skin:    General: Skin is warm and dry.  Neurological:     Mental Status: He is alert.     (all labs ordered are listed, but only abnormal results are displayed) Labs Reviewed - No data to display  EKG: None  Radiology: No results found.   Procedures   Medications Ordered in the ED  naproxen  (NAPROSYN ) tablet 500 mg (has no administration in time range)                                    Medical Decision Making Risk Prescription drug management.   Low suspicion for septic arthritis, inflammatory arthritis other than osteoarthritis.  Patient has been told multiple times that he needs joint replacements.  Given a dose of naproxen . No indication for imaging or further workup at this time.  Stable for discharge.   Final  diagnoses:  Hip pain, unspecified laterality    ED Discharge Orders     None          Riyaan Heroux, Selinda, MD 01/31/24 [phone]

## 2024-02-01 ENCOUNTER — Other Ambulatory Visit: Payer: Self-pay

## 2024-02-01 ENCOUNTER — Encounter (HOSPITAL_COMMUNITY): Payer: Self-pay | Admitting: Emergency Medicine

## 2024-02-01 ENCOUNTER — Emergency Department (HOSPITAL_COMMUNITY)
Admission: EM | Admit: 2024-02-01 | Discharge: 2024-02-01 | Disposition: A | Discharge status: Home / Self Care | Attending: Emergency Medicine | Admitting: Emergency Medicine

## 2024-02-01 DIAGNOSIS — W19XXXA Unspecified fall, initial encounter: Secondary | ICD-10-CM | POA: Diagnosis not present

## 2024-02-01 DIAGNOSIS — M1612 Unilateral primary osteoarthritis, left hip: Secondary | ICD-10-CM | POA: Diagnosis not present

## 2024-02-01 MED ORDER — NAPROXEN 500 MG PO TABS: 500.0000 mg | ORAL_TABLET | Freq: Two times a day (BID) | ORAL | 0 refills | Status: DC

## 2024-02-01 MED ORDER — NAPROXEN 250 MG PO TABS
500.0000 mg | ORAL_TABLET | Freq: Once | ORAL | Status: AC
  Administered 2024-02-01: 500 mg via ORAL
  Filled 2024-02-01: qty 2

## 2024-02-01 MED ORDER — NAPROXEN 500 MG PO TABS: 500.0000 mg | ORAL_TABLET | Freq: Two times a day (BID) | ORAL | 0 refills | Status: AC

## 2024-02-01 NOTE — ED Notes (Signed)
 Pt updated on plan of care

## 2024-02-01 NOTE — ED Triage Notes (Signed)
 Patient reports chronic pain at left hip , ambulatory using cane .

## 2024-02-01 NOTE — Discharge Instructions (Signed)
 It was a pleasure taking care of you today. You were seen in the Emergency Department for evaluation of left hip pain. Your work-up was reassuring.  Your symptoms are consistent with a hip osteoarthritis.  I did give you 500 mg of naproxen  while you are in the emergency department.  I did also send this medication to your pharmacy on file. Refer to the attached documentation for further management of your symptoms. Follow up with orthopedic surgery if your symptoms continue.  Please return to the ER if you experience chest pain, trouble breathing, intractable nausea/vomiting or any other life threatening illnesses.

## 2024-02-01 NOTE — ED Provider Notes (Signed)
 [place] EMERGENCY DEPARTMENT AT [facility] Provider Note   CSN: [id number] Arrival date & time: 02/01/24  9460     Patient presents with: Left Hip Pain   Kevin Barton is a 63 y.o. male with past medical history of hip and knee osteoarthritis, who presents emergency department for evaluation of left hip pain.  Patient was seen in the emergency department yesterday for evaluation of the same, and given naproxen .  He did report this helped, but the medicine has worn off at this time.  He denies any recent fevers or illnesses.  No falls or recent trauma.  Patient is followed by orthopedic surgery, who is planning to do a hip replacement on him.   HPI     Prior to Admission medications   Medication Sig Start Date End Date Taking? Authorizing Provider  naproxen  (NAPROSYN ) 500 MG tablet Take 1 tablet (500 mg total) by mouth 2 (two) times daily. 02/01/24  Yes Karyss Frese, Marry RAMAN, PA-C  amLODipine  (NORVASC ) 5 MG tablet Take 1 tablet (5 mg total) by mouth daily. 01/10/24   Lelon Hamilton T, PA-C  aspirin  EC 81 MG tablet Take 1 tablet (81 mg total) by mouth daily. Swallow whole. 01/10/24   Lelon Hamilton T, PA-C  atorvastatin  (LIPITOR ) 80 MG tablet Take 1 tablet (80 mg total) by mouth daily at 6 PM. 01/10/24   Weaver, Hamilton T, PA-C  docusate sodium  (COLACE) 100 MG capsule Take 1 capsule (100 mg total) by mouth daily as needed. Patient not taking: Reported on 01/10/2024 12/04/23 12/03/24  Jule Ronal CROME, PA-C  furosemide  (LASIX ) 20 MG tablet Take 1 tablet (20 mg total) by mouth daily as needed for edema. 01/10/24   Lelon Hamilton T, PA-C  lidocaine  (LIDODERM ) 5 % Place 1 patch onto the skin daily. Remove & Discard patch within 12 hours or as directed by MD Patient not taking: Reported on 01/10/2024 11/23/23   Randol Simmonds, MD  methocarbamol  (ROBAXIN ) 750 MG tablet Take 1 tablet (750 mg total) by mouth 3 (three) times daily as needed. Patient not taking: Reported on 01/10/2024 12/04/23    Jule Ronal CROME, PA-C  ondansetron  (ZOFRAN ) 4 MG tablet Take 1 tablet (4 mg total) by mouth every 8 (eight) hours as needed for nausea or vomiting. Patient not taking: Reported on 01/10/2024 12/04/23   Jule Ronal CROME, PA-C  oxyCODONE -acetaminophen  (PERCOCET) 5-325 MG tablet Take 1-2 tablets by mouth every 8 (eight) hours as needed. To be taken after surgery Patient not taking: Reported on 01/10/2024 12/04/23   Jule Ronal CROME, PA-C  Vitamin D, Ergocalciferol, (DRISDOL) 1.25 MG (50000 UNIT) CAPS capsule Take 50,000 Units by mouth once a week. Patient not taking: Reported on 01/10/2024 10/01/22   [provider]    Allergies: Patient has no known allergies.    Review of Systems  Musculoskeletal:  Positive for arthralgias.    Updated Vital Signs BP (!) 186/92 (BP Location: Right Arm)   Pulse 63   Temp 98 F (36.7 C)   Resp 19   SpO2 100%   Physical Exam Vitals and nursing note reviewed.  Constitutional:      Appearance: Normal appearance. He is not ill-appearing.  Eyes:     General: No scleral icterus. Pulmonary:     Effort: Pulmonary effort is normal. No respiratory distress.  Musculoskeletal:        General: No deformity.  Skin:    Coloration: Skin is not jaundiced.  Neurological:     General: No focal  deficit present.     Mental Status: He is alert.  Psychiatric:        Mood and Affect: Mood normal.     (all labs ordered are listed, but only abnormal results are displayed) Labs Reviewed - No data to display  EKG: None  Radiology: No results found.  Procedures   Medications Ordered in the ED  naproxen  (NAPROSYN ) tablet 500 mg (has no administration in time range)                                 Medical Decision Making  This patient presents to the ED for concern of left hip pain, this involves an extensive number of treatment options, and is a complaint that carries with it a high risk of complications and morbidity.   Differential diagnosis  includes: Osteoarthritis, septic arthritis, fracture, dislocation  Co morbidities:  osteoarthritis, hypertension  Social Determinants of Health:   homeless  Additional history:  Patient frequently evaluated in the emergency department for the same complaint.  He is followed by orthopedics, who is planning a total hip arthroplasty  Lab Tests:  Not indicated  Imaging Studies:  Not indicated  Cardiac Monitoring/ECG:  The patient was maintained on a cardiac monitor.  I personally viewed and interpreted the cardiac monitored which showed an underlying rhythm of: Sinus rhythm  Medicines ordered and prescription drug management:  I ordered medication including  Medications  naproxen  (NAPROSYN ) tablet 500 mg (has no administration in time range)   for pain management Reevaluation of the patient after these medicines showed that the patient improved I have reviewed the patients home medicines and have made adjustments as needed  Test Considered:  none  Critical Interventions:  none  Consultations Obtained: none  Problem List / ED Course:     ICD-10-CM   1. Primary osteoarthritis of left hip  M16.12       MDM: 63 year old male who presents emergency department for evaluation of left hip pain.  Patient is well-known to the emergency department and frequently seen for this complaint.  In the past, he has received Tylenol  which he states does not help.  I did order 500 of naproxen  as he did receive this yesterday and stated it helped.  Patient is also being followed by orthopedic surgery, who is planning to perform a hip arthroplasty at some point after Christmas.  Upon reassessment, patient reports he is feeling better and is requesting a snack and a bus pass.  I am agreeable to this.  No indication for additional workup at this time.  Vital signs are stable.  Patient is appropriate for discharge at this time.   Dispostion:  After consideration of the diagnostic results  and the patients response to treatment, I feel that the patient would benefit from supportive care.   Final diagnoses:  Primary osteoarthritis of left hip    ED Discharge Orders          Ordered    naproxen  (NAPROSYN ) 500 MG tablet  2 times daily,   Status:  Discontinued        02/01/24 0725    naproxen  (NAPROSYN ) 500 MG tablet  2 times daily        02/01/24 0734               Torrence Marry RAMAN, PA-C 02/01/24 [phone]

## 2024-02-02 ENCOUNTER — Emergency Department (HOSPITAL_COMMUNITY)
Admission: EM | Admit: 2024-02-02 | Discharge: 2024-02-02 | Disposition: A | Attending: Emergency Medicine | Admitting: Emergency Medicine

## 2024-02-02 ENCOUNTER — Emergency Department (HOSPITAL_COMMUNITY)

## 2024-02-02 DIAGNOSIS — Z043 Encounter for examination and observation following other accident: Secondary | ICD-10-CM | POA: Diagnosis not present

## 2024-02-02 DIAGNOSIS — G8929 Other chronic pain: Secondary | ICD-10-CM

## 2024-02-02 DIAGNOSIS — M25552 Pain in left hip: Secondary | ICD-10-CM | POA: Diagnosis not present

## 2024-02-02 DIAGNOSIS — M1612 Unilateral primary osteoarthritis, left hip: Secondary | ICD-10-CM | POA: Diagnosis not present

## 2024-02-02 MED ORDER — NAPROXEN 250 MG PO TABS
500.0000 mg | ORAL_TABLET | Freq: Once | ORAL | Status: AC
  Administered 2024-02-02: 500 mg via ORAL
  Filled 2024-02-02: qty 2

## 2024-02-02 NOTE — ED Triage Notes (Signed)
 Pt arrives via [facility] c/o fall d/t his cane slipping in front of him. Pt c/o L hip pain. Denies head injury/LOC.

## 2024-02-02 NOTE — Discharge Instructions (Signed)
 Your x-ray did not show any fracture or dislocation of your left hip.  We recommend over-the-counter medications for management of pain.  Follow-up with your primary care doctor.

## 2024-02-02 NOTE — ED Provider Notes (Signed)
 [place] EMERGENCY DEPARTMENT AT [facility] Provider Note   CSN: [id number] Arrival date & time: 02/02/24  0006     Patient presents with: Kevin Barton is a 63 y.o. male.   63 year old male presenting to the emergency department for left hip pain.  He has a history of chronic left hip pain and reports worsening discomfort after a fall tonight.  States that his leg gave out.  He uses a cane at baseline.  Has not taken any medications prior to arrival for his symptoms.  Last evaluated yesterday for similar complaints.   Fall       Prior to Admission medications   Medication Sig Start Date End Date Taking? Authorizing Provider  amLODipine  (NORVASC ) 5 MG tablet Take 1 tablet (5 mg total) by mouth daily. 01/10/24   Lelon Hamilton T, PA-C  aspirin  EC 81 MG tablet Take 1 tablet (81 mg total) by mouth daily. Swallow whole. 01/10/24   Lelon Hamilton T, PA-C  atorvastatin  (LIPITOR ) 80 MG tablet Take 1 tablet (80 mg total) by mouth daily at 6 PM. 01/10/24   Weaver, Hamilton T, PA-C  docusate sodium  (COLACE) 100 MG capsule Take 1 capsule (100 mg total) by mouth daily as needed. Patient not taking: Reported on 01/10/2024 12/04/23 12/03/24  Jule Ronal CROME, PA-C  furosemide  (LASIX ) 20 MG tablet Take 1 tablet (20 mg total) by mouth daily as needed for edema. 01/10/24   Lelon Hamilton T, PA-C  lidocaine  (LIDODERM ) 5 % Place 1 patch onto the skin daily. Remove & Discard patch within 12 hours or as directed by MD Patient not taking: Reported on 01/10/2024 11/23/23   Randol Simmonds, MD  methocarbamol  (ROBAXIN ) 750 MG tablet Take 1 tablet (750 mg total) by mouth 3 (three) times daily as needed. Patient not taking: Reported on 01/10/2024 12/04/23   Jule Ronal CROME, PA-C  naproxen  (NAPROSYN ) 500 MG tablet Take 1 tablet (500 mg total) by mouth 2 (two) times daily. 02/01/24   Dufour, Marry RAMAN, PA-C  ondansetron  (ZOFRAN ) 4 MG tablet Take 1 tablet (4 mg total) by mouth every 8 (eight) hours as  needed for nausea or vomiting. Patient not taking: Reported on 01/10/2024 12/04/23   Jule Ronal CROME, PA-C  oxyCODONE -acetaminophen  (PERCOCET) 5-325 MG tablet Take 1-2 tablets by mouth every 8 (eight) hours as needed. To be taken after surgery Patient not taking: Reported on 01/10/2024 12/04/23   Jule Ronal CROME, PA-C  Vitamin D, Ergocalciferol, (DRISDOL) 1.25 MG (50000 UNIT) CAPS capsule Take 50,000 Units by mouth once a week. Patient not taking: Reported on 01/10/2024 10/01/22   [provider]    Allergies: Patient has no known allergies.    Review of Systems Ten systems reviewed and are negative for acute change, except as noted in the HPI.    Updated Vital Signs BP (!) 162/96   Pulse 85   Temp 97.7 F (36.5 C) (Oral)   Resp 16   SpO2 98%   Physical Exam Vitals and nursing note reviewed.  Constitutional:      General: He is not in acute distress.    Appearance: He is well-developed. He is not diaphoretic.  HENT:     Head: Normocephalic and atraumatic.  Eyes:     General: No scleral icterus.    Conjunctiva/sclera: Conjunctivae normal.  Pulmonary:     Effort: Pulmonary effort is normal. No respiratory distress.  Musculoskeletal:        General: Normal range of motion.  Cervical back: Normal range of motion.  Skin:    General: Skin is warm and dry.     Coloration: Skin is not pale.     Findings: No erythema or rash.  Neurological:     Mental Status: He is alert and oriented to person, place, and time.  Psychiatric:        Behavior: Behavior normal.     (all labs ordered are listed, but only abnormal results are displayed) Labs Reviewed - No data to display  EKG: None  Radiology: DG Hip Unilat W or Wo Pelvis 2-3 Views Left Result Date: 02/02/2024 EXAM: 2 or more VIEW(S) XRAY OF THE HIP 02/02/2024 03:09:30 AM COMPARISON: 01/09/2024 CLINICAL HISTORY: fall FINDINGS: BONES AND JOINTS: Severe degenerative changes of the left hip with bone-on-bone  apposition, subcortical cyst formation, subcortical sclerosis, and osteophytosis. No acute fracture or malalignment of the right hip. SOFT TISSUES: Vascular calcifications. Metallic linear densities overlying the sacrum measuring 6 and 5 cm likely external to the patient. IMPRESSION: 1. Metallic linear densities overlying the sacrum likely external to the patient. Correlate with physical exam. 2. No acute osseous findings. Electronically signed by: Morgane Naveau MD 02/02/2024 03:30 AM EST RP Workstation: [id number]     Procedures   Medications Ordered in the ED - No data to display                                  Medical Decision Making Amount and/or Complexity of Data Reviewed Radiology: ordered.   This patient presents to the ED for concern of L hip pain, this involves an extensive number of treatment options, and is a complaint that carries with it a high risk of complications and morbidity.  The differential diagnosis includes osteoarthritis vs fracture vs joint dislocation vs sprain/strain vs AVN   Co morbidities that complicate the patient evaluation  Chronic pain HTN CAD HLD   Additional history obtained:  Additional history obtained from EMS personnel External records from outside source obtained and reviewed including prior discharge summaries   Imaging Studies ordered:  I ordered imaging studies including Xray L hip  I independently visualized and interpreted imaging which showed no acute bony pathology I agree with the radiologist interpretation   Cardiac Monitoring:  The patient was maintained on a cardiac monitor.  I personally viewed and interpreted the cardiac monitored which showed an underlying rhythm of: NSR   Medicines ordered and prescription drug management:  I ordered medication including Naproxen  for pain  I have reviewed the patients home medicines and have made adjustments as needed   Test Considered:  CT L hip - felt low  yield   Problem List / ED Course:  Patient presents to the emergency department for evaluation of acute on chronic L hip pain. Patient neurovascularly intact on exam. Imaging negative for fracture, dislocation, bony deformity.  Plan for supportive management including RICE and NSAIDs; primary care follow up as needed. Return precautions discussed and provided. Patient discharged in stable condition with no unaddressed concerns.   Reevaluation:  After the interventions noted above, I reevaluated the patient and found that they have :stayed the same   Social Determinants of Health:  Lives independently Ambulatory with assist device   Dispostion:  After consideration of the diagnostic results and the patients response to treatment, I feel that the patent would benefit from supportive care measures and primary care follow-up.  This is a known chronic  condition for the patient; likely exacerbated from reported fall. Return precautions discussed and provided. Patient discharged in stable condition with no unaddressed concerns.       Final diagnoses:  Chronic left hip pain    ED Discharge Orders     None          [name], PA-C 02/02/24 0503    Trine Raynell Moder, MD 02/02/24 0730

## 2024-02-02 NOTE — ED Provider Triage Note (Signed)
 Emergency Medicine Provider Triage Evaluation Note  Kevin Barton , a 63 y.o. male  was evaluated in triage.  Pt complains of L hip pain after a fall. States his leg gave out. No meds PTA. Uses a cane at baseline. Seen last for similar pain yesterday.  Review of Systems  Positive: As above Negative: As above  Physical Exam  BP (!) 162/96   Pulse 85   Temp 97.7 F (36.5 C) (Oral)   Resp 16   SpO2 98%  Gen:   Awake, no distress   Resp:  Normal effort  MSK:   Moves extremities without difficulty   Medical Decision Making  Medically screening exam initiated at 2:26 AM.  Appropriate orders placed.  Kevin Barton was informed that the remainder of the evaluation will be completed by another provider, this initial triage assessment does not replace that evaluation, and the importance of remaining in the ED until their evaluation is complete.  Acute on chronic L hip pain - xray ordered given reported fall.   [name], PA-C 02/02/24 [id number]

## 2024-02-04 ENCOUNTER — Telehealth: Payer: Self-pay | Admitting: Internal Medicine

## 2024-02-04 NOTE — Telephone Encounter (Signed)
 Copied from CRM [id number]. Topic: General - Other >> Feb 04, 2024 12:39 PM Lauren C wrote:  Reason for CRM: [employer] is calling to let us  know that pt listed [facility] as his advanced medical home (?) and that he denied behavioral case management and to get last date he was seen with us . Provided last date seen of 05/11/21.

## 2024-03-03 ENCOUNTER — Emergency Department (HOSPITAL_COMMUNITY)
Admission: EM | Admit: 2024-03-03 | Discharge: 2024-03-04 | Disposition: A | Discharge status: Home / Self Care | Attending: Emergency Medicine | Admitting: Emergency Medicine

## 2024-03-03 ENCOUNTER — Other Ambulatory Visit: Payer: Self-pay

## 2024-03-03 ENCOUNTER — Encounter (HOSPITAL_COMMUNITY): Payer: Self-pay

## 2024-03-03 ENCOUNTER — Emergency Department (HOSPITAL_COMMUNITY)

## 2024-03-03 DIAGNOSIS — M25552 Pain in left hip: Secondary | ICD-10-CM | POA: Diagnosis present

## 2024-03-03 DIAGNOSIS — Z7982 Long term (current) use of aspirin: Secondary | ICD-10-CM | POA: Diagnosis not present

## 2024-03-03 NOTE — ED Triage Notes (Signed)
 Pt c/o L hip pain x 1 day; denies new injury

## 2024-03-03 NOTE — Patient Outreach (Addendum)
 Telephone call to patient to discuss referral from [employer] for Complex Care Management.  CCM program explained.  Patient consented to participate in program and agreed to schedule an initial telephonic appointment for Tuesday, 03/10/24 at 1 pm.

## 2024-03-03 NOTE — Patient Instructions (Addendum)
" °  Thank you for taking time to speak with me today about our Complex Care Management Program.  Please don't hesitate to contact me if I can be of assistance to you before your scheduled appointment.  Your care management appointment is by telephone on 03/10/24 at 1 pm with our RN Case Manager Warren Quivers.  Please call the care guide team at [phone] if you need to cancel, schedule, or reschedule an appointment.   Please call the [place]  National Suicide Prevention Lifeline: 970-069-2060 or TTY: (832) 533-9962 TTY [phone]) to talk to a trained counselor if you are experiencing a Mental Health or Behavioral Health Crisis or need someone to talk to.  Mikele Sifuentes J. Leana, Social Research Officer, Government    "

## 2024-03-03 NOTE — ED Triage Notes (Signed)
 Pt gives verbal consent for mse

## 2024-03-03 NOTE — Patient Outreach (Deleted)
 Complex Care Management   Visit Note  03/03/2024  Name:  Kevin Barton MRN: [id number] DOB: 14-Oct-1960  Situation: Referral received for Complex Care Management related to health plan referral for OA L hip, R knee pain I obtained verbal consent from {[facility] Patient/Caregiver:28184}.  Visit completed with {[facility] Patient/Caregiver:28184}  {VISIT LOCATION:32553}  Background:   Past Medical History:  Diagnosis Date   Arthritis    CAD (coronary artery disease)    cath 06/2016 occlusion of RCA with collateralization from the left coronary artery. Mild nonobstructive plaque in the left main, LAD, and LCx. LVEF 50%. TX Rx.    Chronic back pain    Chronic knee pain    HTN (hypertension)    Hyperlipidemia    NSTEMI (non-ST elevated myocardial infarction) (HCC)    Stroke (HCC)    Substance abuse (HCC)    Tobacco abuse     Assessment: Patient Reported Symptoms:  Cognitive        Neurological      HEENT        Cardiovascular      Respiratory      Endocrine      Gastrointestinal        Genitourinary      Integumentary      Musculoskeletal          Psychosocial            03/03/2024    PHQ2-9 Depression Screening   Little interest or pleasure in doing things    Feeling down, depressed, or hopeless    PHQ-2 - Total Score    Trouble falling or staying asleep, or sleeping too much    Feeling tired or having little energy    Poor appetite or overeating     Feeling bad about yourself - or that you are a failure or have let yourself or your family down    Trouble concentrating on things, such as reading the newspaper or watching television    Moving or speaking so slowly that other people could have noticed.  Or the opposite - being so fidgety or restless that you have been moving around a lot more than usual    Thoughts that you would be better off dead, or hurting yourself in some way    PHQ2-9 Total Score    If you checked off any problems, how difficult have these  problems made it for you to do your work, take care of things at home, or get along with other people    Depression Interventions/Treatment      There were no vitals filed for this visit.    Medications Reviewed Today   Medications were not reviewed in this encounter     Recommendation:   {RECOMMENDATONS:32554}  Follow Up Plan:   {FOLLOWUP:32559}  SIG ***

## 2024-03-04 ENCOUNTER — Emergency Department (HOSPITAL_COMMUNITY)

## 2024-03-04 NOTE — ED Notes (Signed)
 Patient transported to CT

## 2024-03-04 NOTE — ED Notes (Signed)
 Attempted to find patient in lobby to give discharge paperwork, unable to locate patient

## 2024-03-04 NOTE — ED Notes (Signed)
 Patient ambulated to lobby with use of cane. Refused vital signs & stated just bring my papers out here to the lobby Patient then proceeded to walk out of ER.

## 2024-03-04 NOTE — ED Provider Notes (Signed)
 " [place] EMERGENCY DEPARTMENT AT [facility] Provider Note   CSN: [id number] Arrival date & time: 03/03/24  1820     Patient presents with: Hip Pain   Kevin Barton is a 64 y.o. male.   The history is provided by the patient and medical records.  Hip Pain  Kevin Barton is a 64 y.o. male who presents to the Emergency Department complaining of hip pain.  He presents to the emergency department for evaluation of acute on chronic left hip pain.  He states that he had sudden onset of pain yesterday, that resulted in him falling on the left hip.  He does see orthopedics for his hip with plan for surgery in the future.  He presents today due to worsening pain.  He did not hit anything other than his hip.  He denies any abdominal pain, chest pain, difficulty breathing. Sees Dr. Jerri with orthopedics  He has pain on ambulation.  He normally ambulates with a cane.  Prior to Admission medications  Medication Sig Start Date End Date Taking? Authorizing Provider  amLODipine  (NORVASC ) 5 MG tablet Take 1 tablet (5 mg total) by mouth daily. 01/10/24   Lelon Hamilton T, PA-C  aspirin  EC 81 MG tablet Take 1 tablet (81 mg total) by mouth daily. Swallow whole. 01/10/24   Lelon Hamilton T, PA-C  atorvastatin  (LIPITOR ) 80 MG tablet Take 1 tablet (80 mg total) by mouth daily at 6 PM. 01/10/24   Weaver, Hamilton T, PA-C  docusate sodium  (COLACE) 100 MG capsule Take 1 capsule (100 mg total) by mouth daily as needed. Patient not taking: Reported on 01/10/2024 12/04/23 12/03/24  Jule Ronal CROME, PA-C  furosemide  (LASIX ) 20 MG tablet Take 1 tablet (20 mg total) by mouth daily as needed for edema. 01/10/24   Lelon Hamilton T, PA-C  lidocaine  (LIDODERM ) 5 % Place 1 patch onto the skin daily. Remove & Discard patch within 12 hours or as directed by MD Patient not taking: Reported on 01/10/2024 11/23/23   Randol Simmonds, MD  methocarbamol  (ROBAXIN ) 750 MG tablet Take 1 tablet (750 mg total) by mouth 3 (three) times  daily as needed. Patient not taking: Reported on 01/10/2024 12/04/23   Jule Ronal CROME, PA-C  naproxen  (NAPROSYN ) 500 MG tablet Take 1 tablet (500 mg total) by mouth 2 (two) times daily. 02/01/24   Dufour, Marry RAMAN, PA-C  ondansetron  (ZOFRAN ) 4 MG tablet Take 1 tablet (4 mg total) by mouth every 8 (eight) hours as needed for nausea or vomiting. Patient not taking: Reported on 01/10/2024 12/04/23   Jule Ronal CROME, PA-C  oxyCODONE -acetaminophen  (PERCOCET) 5-325 MG tablet Take 1-2 tablets by mouth every 8 (eight) hours as needed. To be taken after surgery Patient not taking: Reported on 01/10/2024 12/04/23   Jule Ronal CROME, PA-C  Vitamin D, Ergocalciferol, (DRISDOL) 1.25 MG (50000 UNIT) CAPS capsule Take 50,000 Units by mouth once a week. Patient not taking: Reported on 01/10/2024 10/01/22   [provider]    Allergies: Patient has no known allergies.    Review of Systems  All other systems reviewed and are negative.   Updated Vital Signs BP 128/68   Pulse 63   Temp 97.8 F (36.6 C) (Oral)   Resp 16   SpO2 96%   Physical Exam Vitals and nursing note reviewed.  Constitutional:      Appearance: He is well-developed.  HENT:     Head: Normocephalic and atraumatic.  Cardiovascular:     Rate and Rhythm:  Normal rate and regular rhythm.  Pulmonary:     Effort: Pulmonary effort is normal. No respiratory distress.  Abdominal:     Palpations: Abdomen is soft.     Tenderness: There is no abdominal tenderness. There is no guarding or rebound.  Musculoskeletal:     Comments: Mild tenderness to palpation over the left lateral hip.  He is able to flex and extend the hip but has pain on range of motion.  Skin:    General: Skin is warm and dry.  Neurological:     Mental Status: He is alert and oriented to person, place, and time.  Psychiatric:        Behavior: Behavior normal.     (all labs ordered are listed, but only abnormal results are displayed) Labs Reviewed - No data  to display  EKG: None  Radiology: CT Hip Left Wo Contrast Result Date: 03/04/2024 EXAM: CT OF THE LEFT HIP WITHOUT IV CONTRAST 03/04/2024 01:43:19 AM TECHNIQUE: CT of the left hip was performed without the administration of intravenous contrast. Multiplanar reformatted images are provided for review. Automated exposure control, iterative reconstruction, and/or weight based adjustment of the mA/kV was utilized to reduce the radiation dose to as low as reasonably achievable. COMPARISON: Radiographs 03/03/2024 and 02/02/2024. CLINICAL HISTORY: Hip pain, stress fracture suspected, neg xray. FINDINGS: BONES: Linear sclerosis at the femoral head may represent avascular necrosis or impaction fracture. No intertrochanteric fracture is identified. Normal alignment. Visualized left hemipelvis appears intact. No aggressive appearing osseous abnormality or periostitis. SOFT TISSUE: Calcification of the iliac arteries. No significant soft tissue edema or fluid collections. No soft tissue mass. JOINT: Severe degenerative changes in the left hip with joint space narrowing resulting in bone on bone. Prominent osteophyte formation and multiple subchondral cysts on both sides of the joint. No osseous erosions. INTRAPELVIC CONTENTS: The prostate gland is not enlarged. The bladder is normal. The visualized small and large bowel are not abnormally distended. Limited images of the intrapelvic contents are unremarkable. IMPRESSION: 1. Linear sclerosis at the femoral head, which may represent avascular necrosis or an impaction fracture; no intertrochanteric fracture is identified. 2. Severe degenerative changes in the left hip with bone-on-bone joint space narrowing, prominent osteophyte formation, and multiple subchondral cysts. Electronically signed by: Elsie Gravely MD 03/04/2024 01:53 AM EST RP Workstation: [id number]   DG Hip Unilat With Pelvis 2-3 Views Left Result Date: 03/03/2024 EXAM: 2 or more VIEW(S) XRAY OF THE LEFT  HIP 03/03/2024 09:22:58 PM COMPARISON: 02/02/2024 CLINICAL HISTORY: pain FINDINGS: BONES AND JOINTS: Stable severe degenerative changes of the left hip with bone-on-bone apposition, subcortical cyst formation, subcortical sclerosis, and osteophytosis. Moderate degenerative changes of the right hip. There is a subtle sclerotic lucency through the neck of the left femur which is indeterminate for nondisplaced fracture. No malalignment. SOFT TISSUES: Vascular calcifications. IMPRESSION: 1. Subtle sclerotic lucency through the neck of the left femur, indeterminate for nondisplaced fracture. Consider CT if there is high clinical suspicion for fracture. 2. Stable severe degenerative changes of the left hip and moderate degenerative changes of the right hip. Electronically signed by: Greig Pique MD 03/03/2024 09:26 PM EST RP Workstation: [id number]     Procedures   Medications Ordered in the ED - No data to display                                  Medical Decision Making Amount and/or Complexity of Data Reviewed Radiology:  ordered.   Patient here for evaluation of acute on chronic left hip pain.  Plain film with possible lucency at the femoral neck.  CT scan was obtained, which demonstrates avascular necrosis versus impaction fracture.  Discussed with Dr. Genelle with orthopedics, who reviewed the films.  He feels patient is stable for outpatient orthopedics follow-up.  Discussed with patient findings of studies.  Plan to discharge with outpatient Ortho follow-up and return precautions.     Final diagnoses:  Left hip pain    ED Discharge Orders     None          Griselda Norris, MD 03/04/24 [phone]  "

## 2024-03-05 ENCOUNTER — Emergency Department (HOSPITAL_COMMUNITY)
Admission: EM | Admit: 2024-03-05 | Discharge: 2024-03-05 | Disposition: A | Discharge status: Home / Self Care | Attending: Emergency Medicine | Admitting: Emergency Medicine

## 2024-03-05 DIAGNOSIS — M25552 Pain in left hip: Secondary | ICD-10-CM | POA: Diagnosis present

## 2024-03-05 DIAGNOSIS — G8929 Other chronic pain: Secondary | ICD-10-CM | POA: Insufficient documentation

## 2024-03-05 DIAGNOSIS — Z7982 Long term (current) use of aspirin: Secondary | ICD-10-CM | POA: Diagnosis not present

## 2024-03-05 MED ORDER — ACETAMINOPHEN 500 MG PO TABS
1000.0000 mg | ORAL_TABLET | Freq: Once | ORAL | Status: AC
  Administered 2024-03-05: 1000 mg via ORAL
  Filled 2024-03-05: qty 2

## 2024-03-05 MED ORDER — KETOROLAC TROMETHAMINE 15 MG/ML IJ SOLN
15.0000 mg | Freq: Once | INTRAMUSCULAR | Status: AC
  Administered 2024-03-05: 15 mg via INTRAMUSCULAR
  Filled 2024-03-05: qty 1

## 2024-03-05 NOTE — Discharge Instructions (Addendum)
 You were seen in the ER today for evaluation of your chronic hip pain. Please make sure that you follow up with Dr. Jerri and call to schedule your surgery. I have included for information for you to review. If you have any concerns, new or worsening symptoms, please return to the nearest ER for re-evaluation.   Contact a health care provider if: You cannot put weight on your leg. Your pain or swelling gets worse after a week. It gets harder to walk. You have a fever. Get help right away if: You fall. You have a sudden increase in pain and swelling in your hip. Your hip is red or swollen or very tender to touch

## 2024-03-05 NOTE — ED Triage Notes (Signed)
 Pt c/o chronic L hip pain. Denies new injury.

## 2024-03-05 NOTE — ED Provider Notes (Signed)
 " [name]   CSN: [id number] Arrival date & time: 03/05/24  1758     Patient presents with: Hip Pain   Kevin Barton is a 64 y.o. male with history of left hip pain that is chronic presents the ER today requesting pain medication.  Patient was just here for left hip pain and was told that he needed to follow-up with his hip surgeon as he likely needs replacement.  He denies any new, worsening, or changed pain.  He reports that he was not sure that he was post to follow-up with orthopedics.  Reports that he was scheduled previously for hip surgery with Dr. Jerri however was rescheduled due to his marijuana use.  He reports that he has not used marijuana in quite some times and has a dissipating this surgery.  Denies any numbness or tingling.  Has been walking with ambulatory aids.  Denies any numbness or tingling.  This feels like his chronic pain. Denies any trauma or change in his gait.    Hip Pain       Prior to Admission medications  Medication Sig Start Date End Date Taking? Authorizing Provider  amLODipine  (NORVASC ) 5 MG tablet Take 1 tablet (5 mg total) by mouth daily. 01/10/24   Lelon Hamilton T, PA-C  aspirin  EC 81 MG tablet Take 1 tablet (81 mg total) by mouth daily. Swallow whole. 01/10/24   Lelon Hamilton T, PA-C  atorvastatin  (LIPITOR ) 80 MG tablet Take 1 tablet (80 mg total) by mouth daily at 6 PM. 01/10/24   Weaver, Hamilton T, PA-C  docusate sodium  (COLACE) 100 MG capsule Take 1 capsule (100 mg total) by mouth daily as needed. Patient not taking: Reported on 01/10/2024 12/04/23 12/03/24  Jule Ronal CROME, PA-C  furosemide  (LASIX ) 20 MG tablet Take 1 tablet (20 mg total) by mouth daily as needed for edema. 01/10/24   Lelon Hamilton T, PA-C  lidocaine  (LIDODERM ) 5 % Place 1 patch onto the skin daily. Remove & Discard patch within 12 hours or as directed by MD Patient not taking: Reported on 01/10/2024 11/23/23   Randol Simmonds, MD   methocarbamol  (ROBAXIN ) 750 MG tablet Take 1 tablet (750 mg total) by mouth 3 (three) times daily as needed. Patient not taking: Reported on 01/10/2024 12/04/23   Jule Ronal CROME, PA-C  naproxen  (NAPROSYN ) 500 MG tablet Take 1 tablet (500 mg total) by mouth 2 (two) times daily. 02/01/24   Dufour, Marry RAMAN, PA-C  ondansetron  (ZOFRAN ) 4 MG tablet Take 1 tablet (4 mg total) by mouth every 8 (eight) hours as needed for nausea or vomiting. Patient not taking: Reported on 01/10/2024 12/04/23   Jule Ronal CROME, PA-C  oxyCODONE -acetaminophen  (PERCOCET) 5-325 MG tablet Take 1-2 tablets by mouth every 8 (eight) hours as needed. To be taken after surgery Patient not taking: Reported on 01/10/2024 12/04/23   Jule Ronal CROME, PA-C  Vitamin D, Ergocalciferol, (DRISDOL) 1.25 MG (50000 UNIT) CAPS capsule Take 50,000 Units by mouth once a week. Patient not taking: Reported on 01/10/2024 10/01/22   [provider]    Allergies: Patient has no known allergies.    Review of Systems  Constitutional:  Negative for chills and fever.  Musculoskeletal:  Positive for arthralgias.    Updated Vital Signs BP 134/72 (BP Location: Right Arm)   Pulse 76   Temp 97.6 F (36.4 C) (Oral)   Resp 16   SpO2 100%   Physical Exam Vitals and nursing  note reviewed.  Constitutional:      General: He is not in acute distress.    Appearance: He is not ill-appearing or toxic-appearing.  Eyes:     General: No scleral icterus. Pulmonary:     Effort: Pulmonary effort is normal. No respiratory distress.  Musculoskeletal:        General: Tenderness present.     Comments: Tenderness to the left lateral hip. NV intact distally. Leg warm and well profuse. Compartments are soft.    Skin:    General: Skin is warm and dry.  Neurological:     Mental Status: He is alert.     (all labs ordered are listed, but only abnormal results are displayed) Labs Reviewed - No data to display  EKG: None  Radiology: CT Hip Left  Wo Contrast Result Date: 03/04/2024 EXAM: CT OF THE LEFT HIP WITHOUT IV CONTRAST 03/04/2024 01:43:19 AM TECHNIQUE: CT of the left hip was performed without the administration of intravenous contrast. Multiplanar reformatted images are provided for review. Automated exposure control, iterative reconstruction, and/or weight based adjustment of the mA/kV was utilized to reduce the radiation dose to as low as reasonably achievable. COMPARISON: Radiographs 03/03/2024 and 02/02/2024. CLINICAL HISTORY: Hip pain, stress fracture suspected, neg xray. FINDINGS: BONES: Linear sclerosis at the femoral head may represent avascular necrosis or impaction fracture. No intertrochanteric fracture is identified. Normal alignment. Visualized left hemipelvis appears intact. No aggressive appearing osseous abnormality or periostitis. SOFT TISSUE: Calcification of the iliac arteries. No significant soft tissue edema or fluid collections. No soft tissue mass. JOINT: Severe degenerative changes in the left hip with joint space narrowing resulting in bone on bone. Prominent osteophyte formation and multiple subchondral cysts on both sides of the joint. No osseous erosions. INTRAPELVIC CONTENTS: The prostate gland is not enlarged. The bladder is normal. The visualized small and large bowel are not abnormally distended. Limited images of the intrapelvic contents are unremarkable. IMPRESSION: 1. Linear sclerosis at the femoral head, which may represent avascular necrosis or an impaction fracture; no intertrochanteric fracture is identified. 2. Severe degenerative changes in the left hip with bone-on-bone joint space narrowing, prominent osteophyte formation, and multiple subchondral cysts. Electronically signed by: Elsie Gravely MD 03/04/2024 01:53 AM EST RP Workstation: [id number]   DG Hip Unilat With Pelvis 2-3 Views Left Result Date: 03/03/2024 EXAM: 2 or more VIEW(S) XRAY OF THE LEFT HIP 03/03/2024 09:22:58 PM COMPARISON: 02/02/2024  CLINICAL HISTORY: pain FINDINGS: BONES AND JOINTS: Stable severe degenerative changes of the left hip with bone-on-bone apposition, subcortical cyst formation, subcortical sclerosis, and osteophytosis. Moderate degenerative changes of the right hip. There is a subtle sclerotic lucency through the neck of the left femur which is indeterminate for nondisplaced fracture. No malalignment. SOFT TISSUES: Vascular calcifications. IMPRESSION: 1. Subtle sclerotic lucency through the neck of the left femur, indeterminate for nondisplaced fracture. Consider CT if there is high clinical suspicion for fracture. 2. Stable severe degenerative changes of the left hip and moderate degenerative changes of the right hip. Electronically signed by: Greig Pique MD 03/03/2024 09:26 PM EST RP Workstation: [id number]    Procedures   Medications Ordered in the ED  ketorolac  (TORADOL ) 15 MG/ML injection 15 mg (15 mg Intramuscular Given 03/05/24 1826)  acetaminophen  (TYLENOL ) tablet 1,000 mg (1,000 mg Oral Given 03/05/24 1826)                                Medical Decision Making Risk  OTC drugs. Prescription drug management.   64 y.o. male presents to the ER today for evaluation of hip pain. Differential diagnosis includes but is not limited to chronic pain, septic arthritis, bony injury. Vital signs unremarkable. Physical exam as noted above.   Patient was discharged from the ER yesterday for similar complaint and had x-ray and CT imaging done.  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  Patient reports he was unaware that he needed to follow-up with orthopedics soon for this.  He was questioning what he can take for pain.  Will give him some Toradol  Tylenol  here.  Discussed with her pain regimen at home.  He is neurovasc intact distally does not appear in any acute distress.  Will discharge home and recommended orthopedic follow-up.  We discussed plan at  bedside. We discussed strict return precautions and red flag symptoms. The patient verbalized their understanding and agrees to the plan. The patient is stable and being discharged home in good condition.  Portions of this report may have been transcribed using voice recognition software. Every effort was made to ensure accuracy; however, inadvertent computerized transcription errors may be present.    Final diagnoses:  Chronic left hip pain    ED Discharge Orders     None          Bernis Ernst, DEVONNA 03/05/24 2124    Garrick Charleston, MD 03/05/24 2300  "

## 2024-03-05 NOTE — ED Triage Notes (Signed)
 Pt to ER with c/o left hip pain.  States needs surgery for same. Was seen here last night for same.

## 2024-03-09 ENCOUNTER — Telehealth: Payer: Self-pay | Admitting: Orthopaedic Surgery

## 2024-03-09 NOTE — Telephone Encounter (Signed)
 Pt called to an appt and pt is dismissed as pt. Did not tell pt that he was dismissed as pt  but I did explain unable to make appt at this time and that our Administrator Sari May will call him. Please call pt as soon as possible at [phone].

## 2024-03-10 ENCOUNTER — Other Ambulatory Visit: Payer: Self-pay

## 2024-03-10 VITALS — Wt 157.0 lb

## 2024-03-10 DIAGNOSIS — F1490 Cocaine use, unspecified, uncomplicated: Secondary | ICD-10-CM

## 2024-03-10 NOTE — Patient Instructions (Signed)
 Visit Information  Kevin Barton was given information about Medicaid Managed Care team care coordination services as a part of their [facility] Medicaid benefit.   If you would like to schedule transportation through your [facility], please call the following number at least 2 days in advance of your appointment: [phone]   Rides for urgent appointments can also be made after hours by calling Member Services.  Call the Behavioral Health Crisis Line at [phone], at any time, 24 hours a day, 7 days a week. If you are in danger or need immediate medical attention call 911.  Education materials related to chronic pain and substance misuse provided to patient verbally today.  Licensed Clinical Social Worker will call patient on 03/25/24 at 1 pm Social Worker will call patient on 03/13/24 at 1 pm.  Telephone follow up appointment with Managed Medicaid care management team member scheduled for:03/24/24 at 11 am.  Warren Quivers RN CM Population Health-Complex Care Management [facility] [phone]   Following is a copy of your plan of care:   Goals Addressed             This Visit's Progress    VBCI RN Care Plan- Chronic Pain       Problems:  Chronic Disease Management support and education needs related to chronic pain syndrome Knowledge Deficits related to Chronic pain  Goal: Over the next 90 days the Patient will attend all scheduled medical appointments: with providers and specialists pertaining to health maintenance as evidenced by patient report and/or chart review.         continue to work with Medical Illustrator and/or Social Worker to address care management and care coordination needs related to chronic pain as evidenced by adherence to care management team scheduled appointments     demonstrate a decrease chronic pain in exacerbations as evidenced by patient reporting improvement in pain levels, and no additional falls.   demonstrate Improved adherence to prescribed treatment plan for chronic pain syndrome as evidenced by taking all medications as prescribed and following treatment plan. demonstrate Improved health management independence as evidenced by keeping all scheduled appointments, following plan of care for pain management and taking all medications as prescribed.         take all medications exactly as prescribed and will call provider for medication related questions as evidenced by patient report and/or chart review.    verbalize understanding of plan for management of pain as evidenced by following care plan, attending all appointments  with providers and orthopedic specialists, and taking medications as directed.  work with child psychotherapist to [employer] barriers, Substance abuse issues: cocaine use, and Transportation related to the management of chronic pain as evidenced by review of electronic medical record and patient or social worker report      Interventions:   Advised patient to monitor for triggers of increased hip pain, take medications as prescribed, keep all scheduled appointments. Provided education to patient re: management of chronic pain Reviewed medications with patient and discussed the importance of compliance and taking all medications exactly as directed.  Discussed plans with patient for ongoing care management follow up and provided patient with direct contact information for care management team Advised patient, providing education and rationale, to monitor blood pressure daily and record, calling provider  for findings outside established parameters. Social Work referral for substance abuse, housing concerns and assistance with finding additional providers.   Patient Self-Care Activities:  Attend all  scheduled provider appointments Call pharmacy for medication refills 3-7 days in advance of running out of medications Call provider office for new concerns or questions   Perform all self care activities independently  Perform IADL's (shopping, preparing meals, housekeeping, managing finances) independently Take medications as prescribed   Work with the social worker to address care coordination needs and will continue to work with the clinical team to address health care and disease management related needs  Plan:  Telephone follow up appointment with care management team member scheduled for:  03/24/24 at 11 am The care management team will reach out to the patient again over the next 14 days.          VBCI RN Care Plan-Substance Abuse       Problems:  Care Coordination needs related to Alcohol/Substance Use, Housing , and assistance with finding additional providers  Goal: Over the next 90 days the Patient will attend all scheduled medical appointments: with providers pertaining to health maintenance as evidenced by patient report and/or chart review.         continue to work with Medical Illustrator and/or Social Worker to address care management and care coordination needs related to substance abuse as evidenced by adherence to care management team scheduled appointments     demonstrate Improved health management independence as evidenced by testing negative for substance abuse at appointments.        work with child psychotherapist to address substance abuse, housing concerns and lack of providers as evidenced by review of electronic medical record and patient or care team member report    Interventions:   Advised patient to keep all scheduled appointments with RNCM and Child psychotherapist Provided education to patient re: substance abuse Reviewed medications with patient and discussed the importance of compliance. Social Work referral for substance abuse, housing concerns and assistance with finding additional providers. (Patient has been discharged from Ortho practice)  Patient Self-Care Activities:  Attend all scheduled provider appointments Call pharmacy for  medication refills 3-7 days in advance of running out of medications Call provider office for new concerns or questions  Perform all self care activities independently  Perform IADL's (shopping, preparing meals, housekeeping, managing finances) independently Take medications as prescribed   Work with the social worker to address care coordination needs and will continue to work with the clinical team to address health care and disease management related needs  Plan:  Telephone follow up appointment with care management team member scheduled for:  03/24/24 at 11 am The care management team will reach out to the patient again over the next 14 days. Scheduled appointment with BSW for 03/13/24 at 1 pm and LCSW for 03/25/24 at 1 pm

## 2024-03-10 NOTE — Patient Outreach (Signed)
 Complex Care Management   Visit Note  03/10/2024  Name:  Kevin Barton MRN: [id number] DOB: September 10, 1960  Situation: Referral received for Complex Care Management related to chronic pain, and substance abuse. I obtained verbal consent from Patient.  Visit completed with Patient  on the phone  Background:   Past Medical History:  Diagnosis Date   Arthritis    CAD (coronary artery disease)    cath 06/2016 occlusion of RCA with collateralization from the left coronary artery. Mild nonobstructive plaque in the left main, LAD, and LCx. LVEF 50%. TX Rx.    Chronic back pain    Chronic knee pain    HTN (hypertension)    Hyperlipidemia    NSTEMI (non-ST elevated myocardial infarction) (HCC)    Stroke (HCC)    Substance abuse (HCC)    Tobacco abuse     Assessment: Patient Reported Symptoms:  Cognitive Cognitive Status: No symptoms reported, Normal speech and language skills, Alert and oriented to person, place, and time Cognitive/Intellectual Conditions Management [RPT]: None reported or documented in medical history or problem list   Health Maintenance Behaviors: Stress management, Annual physical exam Healing Pattern: Average Health Facilitated by: Pain control, Healthy diet, Rest, Stress management  Neurological Neurological Review of Symptoms: No symptoms reported Neurological Management Strategies: Adequate rest Neurological Self-Management Outcome: 4 (good)  HEENT HEENT Symptoms Reported: No symptoms reported HEENT Management Strategies: Adequate rest, Routine screening HEENT Self-Management Outcome: 4 (good)    Cardiovascular Cardiovascular Symptoms Reported: No symptoms reported (Reports occasional mild swelling in left leg) Cardiovascular Management Strategies: Adequate rest, Routine screening Weight: 157 lb (71.2 kg) Cardiovascular Self-Management Outcome: 4 (good)  Respiratory Respiratory Symptoms Reported: No symptoms reported Respiratory Management Strategies: Adequate  rest, Routine screening  Endocrine Endocrine Symptoms Reported: No symptoms reported Is patient diabetic?: No (Patient states he is borderline') Endocrine Self-Management Outcome: 4 (good)  Gastrointestinal Gastrointestinal Symptoms Reported: No symptoms reported Additional Gastrointestinal Details: Reports regular BM- last today 03/10/24 Gastrointestinal Management Strategies: Adequate rest Gastrointestinal Self-Management Outcome: 4 (good)    Genitourinary Genitourinary Symptoms Reported: No symptoms reported Genitourinary Management Strategies: Adequate rest Genitourinary Self-Management Outcome: 4 (good)  Integumentary Integumentary Symptoms Reported: No symptoms reported Skin Management Strategies: Routine screening Skin Self-Management Outcome: 4 (good)  Musculoskeletal Musculoskelatal Symptoms Reviewed: Joint pain, Limited mobility Additional Musculoskeletal Details: Chronic left sided hip pain- awaiting hip replacement. Uses a cane for ambulation. Musculoskeletal Management Strategies: Adequate rest, Medication therapy, Routine screening Musculoskeletal Self-Management Outcome: 3 (uncertain)      Psychosocial Psychosocial Symptoms Reported: No symptoms reported Behavioral Management Strategies: Adequate rest, Support group Behavioral Health Self-Management Outcome: 5 (very good)   Quality of Family Relationships: involved, supportive, helpful Do you feel physically threatened by others?: No    03/10/2024    PHQ2-9 Depression Screening   Little interest or pleasure in doing things Not at all  Feeling down, depressed, or hopeless Not at all  PHQ-2 - Total Score 0  Trouble falling or staying asleep, or sleeping too much    Feeling tired or having little energy    Poor appetite or overeating     Feeling bad about yourself - or that you are a failure or have let yourself or your family down    Trouble concentrating on things, such as reading the newspaper or watching  television    Moving or speaking so slowly that other people could have noticed.  Or the opposite - being so fidgety or restless that you have been moving around a lot  more than usual    Thoughts that you would be better off dead, or hurting yourself in some way    PHQ2-9 Total Score    If you checked off any problems, how difficult have these problems made it for you to do your work, take care of things at home, or get along with other people    Depression Interventions/Treatment      Today's Vitals   03/10/24 1331  Weight: 157 lb (71.2 kg)   Pain Scale: 0-10 Pain Score: 3  Pain Type: Chronic pain Pain Location: Hip Pain Orientation: Left Pain Descriptors / Indicators: Aching Pain Onset: On-going Patients Stated Pain Goal: 0 Pain Intervention(s): Medication (See eMAR), Rest Multiple Pain Sites: No  Medications Reviewed Today     Reviewed by Leodis Warren DEL, RN (Registered Nurse) on 03/10/24 at 1317  Med List Status: <None>   Medication Order Taking? Sig Documenting Provider Last Dose Status Informant  amLODipine  (NORVASC ) 5 MG tablet [id number] Yes Take 1 tablet (5 mg total) by mouth daily. Lelon Glendia ONEIDA DEVONNA  Active   aspirin  EC 81 MG tablet [id number] Yes Take 1 tablet (81 mg total) by mouth daily. Swallow whole. Lelon Glendia T, PA-C  Active   atorvastatin  (LIPITOR ) 80 MG tablet [id number] Yes Take 1 tablet (80 mg total) by mouth daily at 6 PM. Lelon Glendia T, PA-C  Active   docusate sodium  (COLACE) 100 MG capsule [id number]  Take 1 capsule (100 mg total) by mouth daily as needed.  Patient not taking: Reported on 03/10/2024   Jule Ronal CROME, PA-C  Active   furosemide  (LASIX ) 20 MG tablet [id number] Yes Take 1 tablet (20 mg total) by mouth daily as needed for edema. Lelon Glendia T, PA-C  Active   lidocaine  (LIDODERM ) 5 % [id number]  Place 1 patch onto the skin daily. Remove & Discard patch within 12 hours or as directed by MD  Patient not taking: Reported on 03/10/2024   Randol Simmonds, MD  Active   methocarbamol  (ROBAXIN ) 750 MG tablet [id number] Yes Take 1 tablet (750 mg total) by mouth 3 (three) times daily as needed. Jule Ronal CROME, PA-C  Active   naproxen  (NAPROSYN ) 500 MG tablet [id number] Yes Take 1 tablet (500 mg total) by mouth 2 (two) times daily. Dufour, Marry RAMAN, PA-C  Active   ondansetron  (ZOFRAN ) 4 MG tablet [id number]  Take 1 tablet (4 mg total) by mouth every 8 (eight) hours as needed for nausea or vomiting.  Patient not taking: Reported on 03/10/2024   Jule Ronal CROME, PA-C  Active   oxyCODONE -acetaminophen  (PERCOCET) 5-325 MG tablet [id number]  Take 1-2 tablets by mouth every 8 (eight) hours as needed. To be taken after surgery  Patient not taking: Reported on 03/10/2024   Jule Ronal CROME, PA-C  Active   Vitamin D, Ergocalciferol, (DRISDOL) 1.25 MG (50000 UNIT) CAPS capsule [id number] Yes Take 50,000 Units by mouth once a week. [provider]  Active Self            Recommendation:   Referral to: BSW and LCSW Continue Current Plan of Care  Follow Up Plan:   Telephone follow up appointment date/time:  03/24/24 at 11 am  Warren Leodis RN CM Population Health-Complex Care Management [facility] [phone]

## 2024-03-10 NOTE — Telephone Encounter (Signed)
 I called patient and advised he was dismissed due to multiple positive drug tests, one of which was cocaine and was at preop and surgery had to be cancelled.  Surgery was also cancelled a second time because patient ate a meal prior to surgery.  Patient voiced understanding.  I wished him well.

## 2024-03-13 ENCOUNTER — Other Ambulatory Visit: Payer: Self-pay

## 2024-03-13 NOTE — Patient Outreach (Signed)
 Social Drivers of Health  Community Resource and Care Coordination Visit Note   03/13/2024  Name: Quantarius Genrich MRN: [id number] DOB:[birth year]  Situation: Referral received for [facility] needs assessment and assistance related to Housing  Financial Strain . I obtained verbal consent from Patient.  Visit completed with Patient on the phone.   Background:   SDOH Interventions Today    Flowsheet Row Most Recent Value  SDOH Interventions   Financial Strain Interventions Community Resources Provided     Assessment:   BSW outreached patient to assesess for SDOH barriers. During the call, housing was identified as a barrier. Patient stated that he is currently living in a motel and has been doing so for approximately four years. Patient reported that although this is not a permanent housing situation, he is able to budget his finances to afford the motel and is not at immediate risk of homelessness at this time. Patient expressed interest in obtaining more permanent housing, he has been on the housing list for 4 years and has not heard back. Patient also stated that he is currently receiving $62 per month in SNAP. Patient denied current food insecurity and stated that his benefits, along with budgeting, are sufficient at this time. BSW informed patient that housing resources would be provided to assist with exploring more stable housing options. BSW will send housing-related resources to the patient and will follow up on January 28 at 3:00 PM to review progress and address any additional needs.   Goals Addressed             This Visit's Progress    BSW Goals       Current SDOH Barriers:  Housing barriers  Interventions: Patient interviewed and appropriate screenings performed Referred patient to community resources  Discussed plans with patient for ongoing follow up and provided patient with direct contact number          Recommendation:   attend all scheduled provider  appointments call for transportation assistance at least one week before appointments ask for help if you don't understand your health insurance benefits  Follow Up Plan:   Telephone follow-up 03/25/24 at 3pm  Orlean Fey, BSW [place]  [facility] Social Worker, [employer] [facility] [phone]

## 2024-03-13 NOTE — Patient Instructions (Signed)
 Visit Information  Thank you for taking time to visit with me today. Please don't hesitate to contact me if I can be of assistance to you before our next scheduled appointment.  Our next appointment is by telephone on 1/28 at 3pm Please call the care guide team at [phone] if you need to cancel or reschedule your appointment.   Following is a copy of your care plan:   Goals Addressed             This Visit's Progress    BSW Goals       Current SDOH Barriers:  Housing barriers  Interventions: Patient interviewed and appropriate screenings performed Referred patient to community resources  Discussed plans with patient for ongoing follow up and provided patient with direct contact number          Please call the Suicide and Crisis Lifeline: 988 call the [place]  National Suicide Prevention Lifeline: 210-680-2096 or TTY: 218-159-3225 TTY 442-271-9319) to talk to a trained counselor call 1-800-273-TALK (toll free, 24 hour hotline) go to [facility] [address] [place] [phone]) call 911 if you are experiencing a Mental Health or Behavioral Health Crisis or need someone to talk to.  Patient verbalized understanding of Care plan and visit instructions communicated this visit  Orlean Fey, BSW [facility]  [facility] Social Worker, [employer] [facility] [phone]

## 2024-03-18 ENCOUNTER — Telehealth: Payer: Self-pay | Admitting: Orthopedic Surgery

## 2024-03-18 NOTE — Telephone Encounter (Signed)
 Pt called wanting to schedule an apt, but has been discharged form the practice.

## 2024-03-24 ENCOUNTER — Other Ambulatory Visit: Payer: Self-pay

## 2024-03-24 NOTE — Patient Instructions (Signed)
 Visit Information  [name] was given information about Medicaid Managed Care team care coordination services as a part of their [facility] Community Plan Medicaid benefit.   If you would like to schedule transportation through your [facility], please call the following number at least 2 days in advance of your appointment: [phone]   Rides for urgent appointments can also be made after hours by calling Member Services.  Call the Behavioral Health Crisis Line at [phone], at any time, 24 hours a day, 7 days a week. If you are in danger or need immediate medical attention call 911.  Please see education materials related to chronic pain substance abuse/misuse provided by MyChart link.  Care plan and visit instructions communicated with the patient verbally today. Patient agrees to receive a copy in MyChart. Active MyChart status and patient understanding of how to access instructions and care plan via MyChart confirmed with patient.     Telephone follow up appointment with Managed Medicaid care management team member scheduled for:04/08/24 at 11 am.  Warren Quivers RN CM Population Health-Complex Care Management [facility] [phone]   Following is a copy of your plan of care:   Goals Addressed             This Visit's Progress    VBCI RN Care Plan- Chronic Pain   On track    Problems:  Chronic Disease Management support and education needs related to chronic pain syndrome Knowledge Deficits related to Chronic pain  Goal: Over the next 90 days the Patient will attend all scheduled medical appointments: with providers and specialists pertaining to health maintenance as evidenced by patient report and/or chart review.         continue to work with Medical Illustrator and/or Social Worker to address care management and care coordination needs related to chronic pain as evidenced by adherence to care management team scheduled appointments      demonstrate a decrease chronic pain in exacerbations as evidenced by patient taking all medications as prescribed and reporting improvement in pain levels demonstrate Improved adherence to prescribed treatment plan for chronic pain syndrome as evidenced by taking all medications as prescribed and following treatment plan. demonstrate Improved health management independence as evidenced by keeping all scheduled appointments, following plan of care for pain management and taking all medications as prescribed.         take all medications exactly as prescribed and will call provider for medication related questions as evidenced by patient report and/or chart review.    verbalize understanding of plan for management of pain as evidenced by following care plan, attending all appointments  with providers and orthopedic specialists, and taking medications as directed.  work with child psychotherapist to [employer] barriers, Substance abuse issues: cocaine use, and Transportation related to the management of chronic pain as evidenced by review of electronic medical record and patient or social worker report      Interventions:   Evaluation of current treatment plan related to chronic hip pain  and patient's adherence to plan as established by provider. Advised patient to monitor for triggers of increased hip pain, take medications as prescribed, keep all scheduled appointments. Provided education to patient re: management of chronic pain Reviewed medications with patient and discussed the importance of compliance and taking all medications exactly as directed.  Discussed plans with patient for ongoing care management follow up and provided patient with direct contact information for care management team Advised patient,  providing education and rationale, to monitor blood pressure daily and record, calling provider  for findings outside established parameters. Provided patient with written educational  materials related to chronic pain management.  Patient Self-Care Activities:  Attend all scheduled provider appointments Call pharmacy for medication refills 3-7 days in advance of running out of medications Call provider office for new concerns or questions  Perform all self care activities independently  Perform IADL's (shopping, preparing meals, housekeeping, managing finances) independently Take medications as prescribed   Work with the social worker to address care coordination needs and will continue to work with the clinical team to address health care and disease management related needs  Plan:  Telephone follow up appointment with care management team member scheduled for:  04/08/24 at 11 am          VBCI RN Care Plan-Substance Abuse   On track    Problems:  Care Coordination needs related to Alcohol/Substance Use, Housing , and assistance with finding additional providers  Goal: Over the next 90 days the Patient will attend all scheduled medical appointments: with providers pertaining to health maintenance as evidenced by patient report and/or chart review.         continue to work with Medical Illustrator and/or Social Worker to address care management and care coordination needs related to substance abuse as evidenced by adherence to care management team scheduled appointments     demonstrate Improved health management independence as evidenced by testing negative for substance abuse at appointments.        work with child psychotherapist to address substance abuse, housing concerns and lack of providers as evidenced by review of electronic medical record and patient or care team member report    Interventions:   Advised patient to keep all scheduled appointments with RNCM and Child psychotherapist Provided education to patient re: substance abuse Reviewed medications with patient and discussed the importance of compliance. Social Work referral for substance abuse, housing concerns and  assistance with finding additional providers. (Patient has been discharged from Ortho practice)  Patient Self-Care Activities:  Attend all scheduled provider appointments Call pharmacy for medication refills 3-7 days in advance of running out of medications Call provider office for new concerns or questions  Perform all self care activities independently  Perform IADL's (shopping, preparing meals, housekeeping, managing finances) independently Take medications as prescribed   Work with the social worker to address care coordination needs and will continue to work with the clinical team to address health care and disease management related needs  Plan:  Telephone follow up appointment with care management team member scheduled for:  04/08/24  at 11 am Scheduled appointment with BSW for 03/25/24 and LCSW for 04/01/24.

## 2024-03-24 NOTE — Patient Outreach (Signed)
 Complex Care Management   Visit Note  03/24/2024  Name:  Kevin Barton MRN: [id number] DOB: 1960-07-18  Situation: Referral received for Complex Care Management related to chronic pain and substance abuse.  I obtained verbal consent from Patient.  Visit completed with Patient  on the phone  Background:   Past Medical History:  Diagnosis Date   Arthritis    CAD (coronary artery disease)    cath 06/2016 occlusion of RCA with collateralization from the left coronary artery. Mild nonobstructive plaque in the left main, LAD, and LCx. LVEF 50%. TX Rx.    Chronic back pain    Chronic knee pain    HTN (hypertension)    Hyperlipidemia    NSTEMI (non-ST elevated myocardial infarction) (HCC)    Stroke (HCC)    Substance abuse (HCC)    Tobacco abuse     Assessment: Patient reports less pain and somewhat improved mobility. Patient states that he has given up all illegal substances and has been clean for 1 month.  Patient Reported Symptoms:  Cognitive Cognitive Status: No symptoms reported, Alert and oriented to person, place, and time, Normal speech and language skills Cognitive/Intellectual Conditions Management [RPT]: None reported or documented in medical history or problem list   Health Maintenance Behaviors: Stress management, Annual physical exam Healing Pattern: Average Health Facilitated by: Healthy diet, Rest, Stress management, Prayer/meditation  Neurological Neurological Review of Symptoms: No symptoms reported Neurological Management Strategies: Adequate rest Neurological Self-Management Outcome: 4 (good)  HEENT HEENT Symptoms Reported: No symptoms reported HEENT Management Strategies: Adequate rest, Routine screening HEENT Self-Management Outcome: 4 (good)    Cardiovascular Cardiovascular Symptoms Reported: Swelling in legs or feet (mild swelling in left leg) Does patient have uncontrolled Hypertension?: No Cardiovascular Management Strategies: Adequate rest, Routine  screening Weight: 160 lb (72.6 kg) Cardiovascular Self-Management Outcome: 4 (good)  Respiratory Respiratory Symptoms Reported: No symptoms reported Respiratory Management Strategies: Adequate rest, Routine screening Respiratory Self-Management Outcome: 4 (good)  Endocrine Endocrine Symptoms Reported: No symptoms reported Is patient diabetic?: No Endocrine Self-Management Outcome: 4 (good)  Gastrointestinal Gastrointestinal Symptoms Reported: No symptoms reported Additional Gastrointestinal Details: Reports normal BM's- lsat reported today Gastrointestinal Management Strategies: Adequate rest Gastrointestinal Self-Management Outcome: 4 (good)    Genitourinary Genitourinary Symptoms Reported: No symptoms reported Genitourinary Management Strategies: Adequate rest Genitourinary Self-Management Outcome: 4 (good)  Integumentary Integumentary Symptoms Reported: No symptoms reported Skin Management Strategies: Routine screening Skin Self-Management Outcome: 4 (good)  Musculoskeletal Musculoskelatal Symptoms Reviewed: Limited mobility Additional Musculoskeletal Details: Uses a cane for ambulation. States chronic hip pain has improved. Musculoskeletal Management Strategies: Adequate rest, Medication therapy, Routine screening Musculoskeletal Self-Management Outcome: 3 (uncertain) Falls in the past year?: No Number of falls in past year: 1 or less Was there an injury with Fall?: No Fall Risk Category Calculator: 0 Patient Fall Risk Level: Low Fall Risk Patient at Risk for Falls Due to: Impaired mobility Fall risk Follow up: Falls prevention discussed  Psychosocial Psychosocial Symptoms Reported: No symptoms reported Additional Psychological Details: Patient reports that he has given up all drugs and has been clean now for about a month. Behavioral Management Strategies: Adequate rest Behavioral Health Self-Management Outcome: 5 (very good) Major Change/Loss/Stressor/Fears (CP):  Denies Techniques to Cope with Loss/Stress/Change: None Quality of Family Relationships: helpful, involved Do you feel physically threatened by others?: No    03/24/2024    PHQ2-9 Depression Screening   Little interest or pleasure in doing things Not at all  Feeling down, depressed, or hopeless Not at all  PHQ-2 -  Total Score 0  Trouble falling or staying asleep, or sleeping too much    Feeling tired or having little energy    Poor appetite or overeating     Feeling bad about yourself - or that you are a failure or have let yourself or your family down    Trouble concentrating on things, such as reading the newspaper or watching television    Moving or speaking so slowly that other people could have noticed.  Or the opposite - being so fidgety or restless that you have been moving around a lot more than usual    Thoughts that you would be better off dead, or hurting yourself in some way    PHQ2-9 Total Score    If you checked off any problems, how difficult have these problems made it for you to do your work, take care of things at home, or get along with other people    Depression Interventions/Treatment      Today's Vitals   03/24/24 1120  Weight: 160 lb (72.6 kg)   Pain Scale: 0-10 Pain Score: 0-No pain  Medications Reviewed Today     Reviewed by Leodis Warren DEL, RN (Registered Nurse) on 03/24/24 at 1116  Med List Status: <None>   Medication Order Taking? Sig Documenting Provider Last Dose Status Informant  amLODipine  (NORVASC ) 5 MG tablet [id number] Yes Take 1 tablet (5 mg total) by mouth daily. Lelon Glendia ONEIDA DEVONNA  Active   aspirin  EC 81 MG tablet [id number] Yes Take 1 tablet (81 mg total) by mouth daily. Swallow whole. Lelon Glendia T, PA-C  Active   atorvastatin  (LIPITOR ) 80 MG tablet [id number] Yes Take 1 tablet (80 mg total) by mouth daily at 6 PM. Lelon, Glendia ONEIDA, PA-C  Active   docusate sodium  (COLACE) 100 MG capsule [id number]  Take 1 capsule (100 mg total) by mouth  daily as needed.  Patient not taking: Reported on 03/24/2024   Jule Ronal CROME, PA-C  Active   furosemide  (LASIX ) 20 MG tablet [id number] Yes Take 1 tablet (20 mg total) by mouth daily as needed for edema. Lelon Glendia T, PA-C  Active   lidocaine  (LIDODERM ) 5 % [id number]  Place 1 patch onto the skin daily. Remove & Discard patch within 12 hours or as directed by MD  Patient not taking: Reported on 03/24/2024   Randol Simmonds, MD  Active   methocarbamol  (ROBAXIN ) 750 MG tablet [id number] Yes Take 1 tablet (750 mg total) by mouth 3 (three) times daily as needed. Jule Ronal CROME, PA-C  Active   naproxen  (NAPROSYN ) 500 MG tablet [id number] Yes Take 1 tablet (500 mg total) by mouth 2 (two) times daily. Dufour, Marry RAMAN, PA-C  Active   ondansetron  (ZOFRAN ) 4 MG tablet [id number]  Take 1 tablet (4 mg total) by mouth every 8 (eight) hours as needed for nausea or vomiting.  Patient not taking: Reported on 03/24/2024   Jule Ronal CROME, PA-C  Active   oxyCODONE -acetaminophen  (PERCOCET) 5-325 MG tablet [id number]  Take 1-2 tablets by mouth every 8 (eight) hours as needed. To be taken after surgery  Patient not taking: Reported on 03/24/2024   Jule Ronal CROME, PA-C  Active   Vitamin D, Ergocalciferol, (DRISDOL) 1.25 MG (50000 UNIT) CAPS capsule [id number] Yes Take 50,000 Units by mouth once a week. [provider]  Active Self            Recommendation:   Continue Current Plan of Care  Follow Up Plan:  Telephone follow up appointment date/time:  04/08/24 at 11 am  Warren Quivers RN CM Population Health-Complex Care Management [facility] [phone]

## 2024-03-25 ENCOUNTER — Other Ambulatory Visit: Payer: Self-pay

## 2024-03-25 ENCOUNTER — Telehealth: Admitting: Licensed Clinical Social Worker

## 2024-03-25 NOTE — Patient Instructions (Signed)

## 2024-03-25 NOTE — Patient Outreach (Signed)
 [facility]  Community Resource and Care Coordination Visit Note   03/25/2024  Name: Kevin Barton MRN: [id number] DOB:Jul 09, 1960  Situation: Referral received for [facility] needs assessment and assistance related to [employer] . I obtained verbal consent from Patient.  Visit completed with Patient on the phone.   Background:      Assessment:  BSW outreached patient to confirm whether he had received the resources that were sent. Patient stated that he is currently unaware if the resources were received, as his mail is delivered to a family members home and they have not yet informed him of receiving a letter. Patient stated that he plans to check in with his family member soon regarding the mail.During the call, patient reported that his housing situation is stable at this time and that there are no immediate barriers. Patient stated that he is not at risk of being asked to leave his current living situation; however, he expressed interest in finding a more stable housing option in the future. Patient stated that he will reach out to BSW if he has any questions once he receives and reviews the resources.   Recommendation:   attend all scheduled provider appointments call for transportation assistance at least one week before appointments ask for help if you don't understand your health insurance benefits  Follow Up Plan:   Patient has achieved all patient stated goals. [employer] will be closed. Patient has been provided contact information should new needs arise.   Orlean Fey, BSW [place]  [facility] Social Worker, [employer] [facility] [phone]

## 2024-03-29 ENCOUNTER — Other Ambulatory Visit: Payer: Self-pay

## 2024-03-29 ENCOUNTER — Encounter (HOSPITAL_COMMUNITY): Payer: Self-pay | Admitting: Emergency Medicine

## 2024-03-29 ENCOUNTER — Emergency Department (HOSPITAL_COMMUNITY)

## 2024-03-29 ENCOUNTER — Emergency Department (HOSPITAL_COMMUNITY)
Admission: EM | Admit: 2024-03-29 | Discharge: 2024-03-29 | Disposition: A | Discharge status: Home / Self Care | Attending: Emergency Medicine | Admitting: Emergency Medicine

## 2024-03-29 DIAGNOSIS — S82891A Other fracture of right lower leg, initial encounter for closed fracture: Secondary | ICD-10-CM | POA: Insufficient documentation

## 2024-03-29 DIAGNOSIS — W009XXA Unspecified fall due to ice and snow, initial encounter: Secondary | ICD-10-CM | POA: Diagnosis not present

## 2024-03-29 DIAGNOSIS — S8991XA Unspecified injury of right lower leg, initial encounter: Secondary | ICD-10-CM | POA: Diagnosis present

## 2024-03-29 DIAGNOSIS — Z79899 Other long term (current) drug therapy: Secondary | ICD-10-CM | POA: Diagnosis not present

## 2024-03-29 DIAGNOSIS — Z7982 Long term (current) use of aspirin: Secondary | ICD-10-CM | POA: Insufficient documentation

## 2024-03-29 MED ORDER — IBUPROFEN 800 MG PO TABS
800.0000 mg | ORAL_TABLET | Freq: Once | ORAL | Status: AC
  Administered 2024-03-29: 800 mg via ORAL
  Filled 2024-03-29: qty 1

## 2024-03-29 MED ORDER — OXYCODONE-ACETAMINOPHEN 5-325 MG PO TABS: 1.0000 | ORAL_TABLET | Freq: Three times a day (TID) | ORAL | 0 refills | Status: AC | PRN

## 2024-03-29 NOTE — ED Provider Notes (Signed)
 " [place] EM[facility]GENCY DEPART[facility] AT [facility] Provider Note   CSN: [id number] Arrival date & time: 03/29/24  1755     Patient presents with: Ankle Pain   Kevin Barton is a 64 y.o. male.   The history is provided by the patient, medical records and the EMS personnel. No language interpreter was used.  Ankle Pain Associated symptoms: no fever      64 year old male brought here via EMS from [employer] [facility] for evaluation for fall.  Patient states that he slipped on ice and twisted his right ankle.  He did not fall to the ground he denies hitting his head or loss of consciousness.  Incident happened just prior to arrival.  States he was able to bear weight but endorsed pain to his ankle.  Denies any knee pain or hip pain.  No other injury.  Patient at baseline uses cane to walk.  He denies any specific treatment tried prior to arrival.  Pain is mild to moderate.  Denies any numbness.  Prior to Admission medications  Medication Sig Start Date End Date Taking? Authorizing Provider  amLODipine  (NORVASC ) 5 MG tablet Take 1 tablet (5 mg total) by mouth daily. 01/10/24   Lelon Hamilton T, PA-C  aspirin  EC 81 MG tablet Take 1 tablet (81 mg total) by mouth daily. Swallow whole. 01/10/24   Lelon Hamilton T, PA-C  atorvastatin  (LIPITOR ) 80 MG tablet Take 1 tablet (80 mg total) by mouth daily at 6 PM. 01/10/24   Weaver, Hamilton T, PA-C  docusate sodium  (COLACE) 100 MG capsule Take 1 capsule (100 mg total) by mouth daily as needed. Patient not taking: Reported on 03/24/2024 12/04/23 12/03/24  Jule Ronal CROME, PA-C  furosemide  (LASIX ) 20 MG tablet Take 1 tablet (20 mg total) by mouth daily as needed for edema. 01/10/24   Lelon Hamilton T, PA-C  lidocaine  (LIDODERM ) 5 % Place 1 patch onto the skin daily. Remove & Discard patch within 12 hours or as directed by MD Patient not taking: Reported on 03/24/2024 11/23/23   Randol Simmonds, MD  methocarbamol  (ROBAXIN ) 750 MG tablet Take 1 tablet (750 mg total) by mouth  3 (three) times daily as needed. 12/04/23   Jule Ronal CROME, PA-C  naproxen  (NAPROSYN ) 500 MG tablet Take 1 tablet (500 mg total) by mouth 2 (two) times daily. 02/01/24   Dufour, Marry RAMAN, PA-C  ondansetron  (ZOFRAN ) 4 MG tablet Take 1 tablet (4 mg total) by mouth every 8 (eight) hours as needed for nausea or vomiting. Patient not taking: Reported on 03/24/2024 12/04/23   Jule Ronal CROME, PA-C  oxyCODONE -acetaminophen  (PERCOCET) 5-325 MG tablet Take 1-2 tablets by mouth every 8 (eight) hours as needed. To be taken after surgery Patient not taking: Reported on 03/24/2024 12/04/23   Jule Ronal CROME, PA-C  Vitamin D, Ergocalciferol, (DRISDOL) 1.25 MG (50000 UNIT) CAPS capsule Take 50,000 Units by mouth once a week. 10/01/22   [provider]    Allergies: Patient has no known allergies.    Review of Systems  Constitutional:  Negative for fever.  Skin:  Negative for wound.    Updated Vital Signs BP 130/82   Pulse 78   Temp 98.6 F (37 C)   Resp 18   Ht 5' 5 (1.651 m)   Wt 72.6 kg   SpO2 97%   BMI 26.63 kg/m   Physical Exam Constitutional:      General: He is not in acute distress.    Appearance: He is well-developed.  HENT:     Head: Atraumatic.  Eyes:     Conjunctiva/sclera: Conjunctivae normal.  Musculoskeletal:        General: Tenderness (Right ankle: Tenderness lateral malleoli region without any deformity.  Ankle with full range of motion and no swelling or bruising noted.  Intact dorsalis pedis pulse with brisk cap refill.) present.     Cervical back: Normal range of motion and neck supple.     Comments: Right knee and right hip nontender.  Skin:    Findings: No rash.  Neurological:     Mental Status: He is alert.     (all labs ordered are listed, but only abnormal results are displayed) Labs Reviewed - No data to display  EKG: None  Radiology: No results found.   Procedures   Medications Ordered in the ED - No data to display                                   Medical Decision Making Amount and/or Complexity of Data Reviewed Radiology: ordered.  Risk Prescription drug management.   BP 130/82   Pulse 78   Temp 98.6 F (37 C)   Resp 18   Ht 5' 5 (1.651 m)   Wt 72.6 kg   SpO2 97%   BMI 26.63 kg/m   36:51 PM 64 year old male brought here via EMS from [employer] for evaluation for fall.  Patient states that he slipped on ice and twisted his right ankle.  He did not fall to the ground he denies hitting his head or loss of consciousness.  Incident happened just prior to arrival.  States he was able to bear weight but endorsed pain to his ankle.  Denies any knee pain or hip pain.  No other injury.  Patient at baseline uses cane to walk.  He denies any specific treatment tried prior to arrival.  Pain is mild to moderate.  Denies any numbness.  Exam of right knee with some mild tenderness to the lateral joint line but no obvious deformity to the decreased range of motion noted.  Patient is neurovascular intact.  X-ray ordered, ibuprofen  given for pain.  Differential diagnosis including strain, sprain, fracture, dislocation, contusion  X-ray of the right ankle was obtained independent viewed injury by me which shows a small avulsion fracture along the dorsal aspect of the distal talus.  Patient does have tenderness to this area.  Patient also has a stable benign-appearing [facility] sclerotic lesion to the distal tibia.  Radiologist recommend nonemergent MRI follow-up.  I will discuss this with patient to have outpatient follow-up MRI but patient mention he has injured his ankle in the past and this is likely a result of it.  CAM Walker boot placed for stability and support.  Patient discharged home with opiate pain medication and outpatient follow-up.  Return precaution given.     Final diagnoses:  Avulsion fracture of right ankle, closed, initial encounter    ED Discharge Orders          Ordered    oxyCODONE -acetaminophen   (PERCOCET) 5-325 MG tablet  Every 8 hours PRN        03/29/24 1913               Nivia Colon, PA-C 03/29/24 1915    Cottie Donnice PARAS, MD 03/29/24 2258  "

## 2024-03-29 NOTE — ED Triage Notes (Signed)
 Per [facility] pt coming from [facility] c/o right ankle pain after slipping on ice. Patient ambulates with cane at baseline. Able to tolerate weight.

## 2024-03-29 NOTE — Discharge Instructions (Signed)
 You have been evaluated for your ankle injury.  It appears there is a small bone chip in your right ankle that is causing pain.  You may wear cam walker boot while walking to provide stability and support.  Continue to use your cane.  X-ray also shows some changes to the bone of your right ankle.  Discussed this with your orthopedic specialist or your primary care doctor and request for an outpatient MRI for further assessment.  You may take pain medication as needed but be aware it can cause drowsiness.

## 2024-03-29 NOTE — Progress Notes (Signed)
 Orthopedic Tech Progress Note Patient Details:  Kevin Barton 02/05/1961 [id number]  Ortho Devices Type of Ortho Device: CAM walker Ortho Device/Splint Location: rle Ortho Device/Splint Interventions: Ordered, Application, Adjustment   Post Interventions Patient Tolerated: Well Instructions Provided: Care of device, Adjustment of device  Chandra Dorn PARAS 03/29/2024, 7:54 PM

## 2024-03-30 ENCOUNTER — Encounter (HOSPITAL_COMMUNITY): Payer: Self-pay

## 2024-03-30 ENCOUNTER — Emergency Department (HOSPITAL_COMMUNITY)

## 2024-03-30 ENCOUNTER — Other Ambulatory Visit: Payer: Self-pay

## 2024-03-30 ENCOUNTER — Emergency Department (HOSPITAL_COMMUNITY)
Admission: EM | Admit: 2024-03-30 | Discharge: 2024-03-31 | Disposition: A | Attending: Emergency Medicine | Admitting: Emergency Medicine

## 2024-03-30 DIAGNOSIS — Z5900 Homelessness unspecified: Secondary | ICD-10-CM | POA: Insufficient documentation

## 2024-03-30 DIAGNOSIS — M25552 Pain in left hip: Secondary | ICD-10-CM | POA: Insufficient documentation

## 2024-03-30 DIAGNOSIS — Z7982 Long term (current) use of aspirin: Secondary | ICD-10-CM | POA: Insufficient documentation

## 2024-03-30 NOTE — ED Triage Notes (Signed)
 Pt c/o left hip pain started today. Pt denies numbness or tingling. Pt is homeless.

## 2024-03-31 ENCOUNTER — Other Ambulatory Visit: Payer: Self-pay

## 2024-03-31 ENCOUNTER — Encounter (HOSPITAL_COMMUNITY): Payer: Self-pay | Admitting: *Deleted

## 2024-03-31 ENCOUNTER — Telehealth: Admitting: Licensed Clinical Social Worker

## 2024-03-31 ENCOUNTER — Emergency Department (HOSPITAL_COMMUNITY)
Admission: EM | Admit: 2024-03-31 | Discharge: 2024-03-31 | Disposition: A | Discharge status: Home / Self Care | Attending: Emergency Medicine | Admitting: Emergency Medicine

## 2024-03-31 DIAGNOSIS — M25561 Pain in right knee: Secondary | ICD-10-CM | POA: Insufficient documentation

## 2024-03-31 DIAGNOSIS — M25562 Pain in left knee: Secondary | ICD-10-CM | POA: Insufficient documentation

## 2024-03-31 DIAGNOSIS — Z7982 Long term (current) use of aspirin: Secondary | ICD-10-CM | POA: Insufficient documentation

## 2024-03-31 DIAGNOSIS — G8929 Other chronic pain: Secondary | ICD-10-CM | POA: Insufficient documentation

## 2024-03-31 MED ORDER — IBUPROFEN 800 MG PO TABS
800.0000 mg | ORAL_TABLET | Freq: Once | ORAL | Status: AC
  Administered 2024-03-31: 800 mg via ORAL
  Filled 2024-03-31: qty 1

## 2024-03-31 MED ORDER — OXYCODONE-ACETAMINOPHEN 5-325 MG PO TABS
1.0000 | ORAL_TABLET | Freq: Once | ORAL | Status: AC
  Administered 2024-03-31: 1 via ORAL
  Filled 2024-03-31: qty 1

## 2024-03-31 NOTE — Discharge Instructions (Signed)
 Follow-up with Dr. Beverley in the AM as scheduled. Return here for new concerns.

## 2024-03-31 NOTE — ED Provider Notes (Signed)
 " [facility] Provider Note   CSN: [id number] Arrival date & time: 03/31/24  2135     Patient presents with: Knee Pain   Kevin Barton is a 64 y.o. male.   The history is provided by the patient and medical records.  Knee Pain  64 y.o. M here with bilateral knee pain.  Reports pain all day.  Feels like it is from the cold weather.  Denies falls or trauma.  He reports he is due to see new orthopedist in the AM that I referred him to earlier today.    Prior to Admission medications  Medication Sig Start Date End Date Taking? Authorizing Provider  amLODipine  (NORVASC ) 5 MG tablet Take 1 tablet (5 mg total) by mouth daily. 01/10/24   Lelon Hamilton T, PA-C  aspirin  EC 81 MG tablet Take 1 tablet (81 mg total) by mouth daily. Swallow whole. 01/10/24   Lelon Hamilton T, PA-C  atorvastatin  (LIPITOR ) 80 MG tablet Take 1 tablet (80 mg total) by mouth daily at 6 PM. 01/10/24   Weaver, Hamilton T, PA-C  furosemide  (LASIX ) 20 MG tablet Take 1 tablet (20 mg total) by mouth daily as needed for edema. 01/10/24   Lelon Hamilton T, PA-C  methocarbamol  (ROBAXIN ) 750 MG tablet Take 1 tablet (750 mg total) by mouth 3 (three) times daily as needed. 12/04/23   Jule Ronal CROME, PA-C  naproxen  (NAPROSYN ) 500 MG tablet Take 1 tablet (500 mg total) by mouth 2 (two) times daily. 02/01/24   Dufour, Marry RAMAN, PA-C  oxyCODONE -acetaminophen  (PERCOCET) 5-325 MG tablet Take 1 tablet by mouth every 8 (eight) hours as needed for moderate pain (pain score 4-6) or severe pain (pain score 7-10). 03/29/24   Nivia Colon, PA-C  Vitamin D, Ergocalciferol, (DRISDOL) 1.25 MG (50000 UNIT) CAPS capsule Take 50,000 Units by mouth once a week. 10/01/22   [provider]    Allergies: Patient has no known allergies.    Review of Systems  Musculoskeletal:  Positive for arthralgias.  All other systems reviewed and are negative.   Updated Vital Signs BP 129/75 (BP Location: Left Arm)    Pulse 88   Temp (!) 97.5 F (36.4 C) (Oral)   Resp 16   SpO2 100%   Physical Exam Vitals and nursing note reviewed.  Constitutional:      Appearance: He is well-developed.     Comments: Sleeping in wheelchair, NAD  HENT:     Head: Normocephalic and atraumatic.  Eyes:     Conjunctiva/sclera: Conjunctivae normal.     Pupils: Pupils are equal, round, and reactive to light.  Cardiovascular:     Rate and Rhythm: Normal rate and regular rhythm.     Heart sounds: Normal heart sounds.  Pulmonary:     Effort: Pulmonary effort is normal.     Breath sounds: Normal breath sounds.  Musculoskeletal:        General: Normal range of motion.     Cervical back: Normal range of motion.     Comments: Able to flex/extend both knees, no deformities  Skin:    General: Skin is warm and dry.  Neurological:     Mental Status: He is alert and oriented to person, place, and time.     (all labs ordered are listed, but only abnormal results are displayed) Labs Reviewed - No data to display  EKG: None  Radiology: DG Hip Unilat With Pelvis 2-3 Views Left Result Date: 03/30/2024 EXAM: 2-3  VIEW(S) XRAY OF THE PELVIS AND LEFT HIP 03/30/2024 10:11:00 PM COMPARISON: CT of the left hip 03/04/2024. Left hip x-ray 03/03/2024. CLINICAL HISTORY: Pain. FINDINGS: BONES AND JOINTS: SI joints are symmetric. No acute fracture. Bilateral hips demonstrate normal alignment. Left hip: Severe degenerative changes with complete joint space loss, subcortical cystic changes, and osteophyte formation. There is bone on bone configuration. There is no evidence for collapse of the femoral head. SOFT TISSUES: Vascular calcifications. IMPRESSION: 1. Severe left hip osteoarthritis. 2. No evidence of femoral head collapse. 3. Vascular calcifications. Electronically signed by: Greig Pique MD 03/30/2024 10:18 PM EST RP Workstation: [id number]     Procedures   Medications Ordered in the ED  ibuprofen  (ADVIL ) tablet 800 mg (has no  administration in time range)                                    Medical Decision Making Risk Prescription drug management.   64 y.o. M here with bilateral knee pain.  Well known to the ED and myself, saw him just this AM.  No acute injuries, no deformities on exam.  No emergent imaging needed.  I suspect he is here for secondary gain as it is quite cold and he is asking to sit in the lobby overnight.  Reports follow up with orthopedist in the AM.  Can return here for new concerns.  Final diagnoses:  Chronic pain of both knees    ED Discharge Orders     None          Jarold Olam HERO, PA-C 03/31/24 2231    Ruthe Cornet, DO 03/31/24 2238  "

## 2024-03-31 NOTE — Discharge Instructions (Signed)
 Dr. Beverley is the provider on call since you cannot see Dr. Jerri anymore.  Call his office to set up appt. Return here for new concerns.

## 2024-04-01 ENCOUNTER — Other Ambulatory Visit: Payer: Self-pay | Admitting: Licensed Clinical Social Worker

## 2024-04-01 NOTE — Patient Outreach (Signed)
 Complex Care Management   Visit Note  04/01/2024  Name:  Kevin Barton MRN: [id number] DOB: [birth year]  Situation: Referral received for Complex Care Management related to {Criteria:32550} I obtained verbal consent from {CHL AMB Patient/Caregiver:28184}.  Visit completed with {CHL AMB Patient/Caregiver:28184}  {VISIT LOCATION:32553}  Background:   Past Medical History:  Diagnosis Date   Arthritis    CAD (coronary artery disease)    cath 06/2016 occlusion of RCA with collateralization from the left coronary artery. Mild nonobstructive plaque in the left main, LAD, and LCx. LVEF 50%. TX Rx.    Chronic back pain    Chronic knee pain    HTN (hypertension)    Hyperlipidemia    NSTEMI (non-ST elevated myocardial infarction) (HCC)    Stroke (HCC)    Substance abuse (HCC)    Tobacco abuse     Assessment: Patient Reported Symptoms:  Cognitive Cognitive Status: No symptoms reported      Neurological      HEENT        Cardiovascular      Respiratory      Endocrine      Gastrointestinal        Genitourinary      Integumentary      Musculoskeletal          Psychosocial            04/01/2024    PHQ2-9 Depression Screening   Little interest or pleasure in doing things    Feeling down, depressed, or hopeless    PHQ-2 - Total Score    Trouble falling or staying asleep, or sleeping too much    Feeling tired or having little energy    Poor appetite or overeating     Feeling bad about yourself - or that you are a failure or have let yourself or your family down    Trouble concentrating on things, such as reading the newspaper or watching television    Moving or speaking so slowly that other people could have noticed.  Or the opposite - being so fidgety or restless that you have been moving around a lot more than usual    Thoughts that you would be better off dead, or hurting yourself in some way    PHQ2-9 Total Score    If you checked off any problems, how difficult  have these problems made it for you to do your work, take care of things at home, or get along with other people    Depression Interventions/Treatment      There were no vitals filed for this visit.    Medications Reviewed Today   Medications were not reviewed in this encounter     Recommendation:   {RECOMMENDATONS:32554}  Follow Up Plan:   {FOLLOWUP:32559}  SIG ***

## 2024-04-03 NOTE — Patient Instructions (Signed)
 Visit Information  Thank you for taking time to visit with me today. Please don't hesitate to contact me if I can be of assistance to you before our next scheduled appointment.  Our next appointment is by telephone on 04/15/24 at 9:45 am Please call the care guide team at [phone] if you need to cancel or reschedule your appointment.   Following is a copy of your care plan:   Goals Addressed             This Visit's Progress    LCSW VBCI Social Work Care Plan       Problems:   Disease Management support and education needs related to Substance Abuse and lack of stable housing  CSW Clinical Goal(s):   Over the next 60 days patient will: attend all scheduled medical appointments: with providers pertaining to health maintenance as evidenced by patient report and/or chart review.         continue to work with Medical Illustrator and/or Social Worker to address care management and care coordination needs related to substance abuse as evidenced by adherence to care management team scheduled appointments     demonstrate Improved health management independence as evidenced by testing negative for substance abuse at appointments and considering substance abuse treatment   work with ASSURANT social workers to address substance abuse, housing concerns and lack of providers as evidenced by review of electronic medical record and patient or care team member report   Interventions:  Substance Misuse in a patient with Depression:   Evaluation of current treatment plan related to misuse of: narcotics and alcohol Active listening / Reflection utilized Mining Engineer reviewed Financial Risk Analyst / information provided Depression screen reviewed Emotional Support Provided Mindfulness or Relaxation training provided Motivational Interviewing employed PHQ2/PHQ9 completed Quality of sleep assessed & Sleep Hygiene techniques promoted Solution-Focued Strategies employed: Suicidal  Ideation/Homicidal Ideation assessed: No SI/HI Patient denies [facility] therapy referral during initial visit. He denies need for AA/NA support group meeting list. He reports his housing situation is his biggest stressor at this time. Public Housing Education provided, new BSW referral placed   Advised patient to keep all scheduled appointments with RNCM and Child psychotherapist Provided education to patient re: substance abuse Reviewed medications with patient and discussed the importance of compliance. Social Work referral for substance abuse, housing concerns and assistance with finding additional providers. (Patient has been discharged from Ortho practice) Patient Goals/Self-Care Activities:  Follow up with [employer]. Increase coping skills, healthy habits, self-management skills, and stress reduction  Plan:   The care management team will reach out to the patient again over the next 30 days.        Please call the Suicide and Crisis Lifeline: 988 call the [place]  National Suicide Prevention Lifeline: 925 526 2688 or TTY: 515-702-7788 TTY 2036234228) to talk to a trained counselor call 1-800-273-TALK (toll free, 24 hour hotline) go to [facility] [address], [place] [phone]) call 911 if you are experiencing a Mental Health or Behavioral Health Crisis or need someone to talk to.  Patient verbalized understanding of Care plan and visit instructions communicated this visit  Lyle Rung, BSW, MSW, LCSW Licensed Clinical Social Worker [employer]   [facility] [email or web address] Direct Dial: [phone]

## 2024-04-07 ENCOUNTER — Telehealth

## 2024-04-08 ENCOUNTER — Telehealth

## 2024-04-09 ENCOUNTER — Telehealth

## 2024-04-15 ENCOUNTER — Other Ambulatory Visit: Admitting: Licensed Clinical Social Worker
